# Patient Record
Sex: Female | Born: 1935 | Race: White | Hispanic: No | Smoking: Never smoker
Health system: Southern US, Community
[De-identification: ages and names within clinical notes are randomized; demographics above are authoritative.]

## PROBLEM LIST (undated history)

## (undated) DIAGNOSIS — F419 Anxiety disorder, unspecified: Secondary | ICD-10-CM

## (undated) DIAGNOSIS — Z87442 Personal history of urinary calculi: Secondary | ICD-10-CM

## (undated) HISTORY — PX: FOOT SURGERY: SHX648

## (undated) HISTORY — PX: ABDOMINAL HYSTERECTOMY: SHX81

---

## 1998-06-12 ENCOUNTER — Other Ambulatory Visit: Admission: RE | Admit: 1998-06-12 | Discharge: 1998-06-12 | Payer: Self-pay | Admitting: Obstetrics & Gynecology

## 1999-04-19 ENCOUNTER — Ambulatory Visit (HOSPITAL_COMMUNITY): Admission: RE | Admit: 1999-04-19 | Discharge: 1999-04-19 | Payer: Self-pay | Admitting: Gastroenterology

## 2000-11-25 ENCOUNTER — Encounter: Admission: RE | Admit: 2000-11-25 | Discharge: 2000-11-25 | Payer: Self-pay | Admitting: Obstetrics & Gynecology

## 2000-11-25 ENCOUNTER — Encounter: Payer: Self-pay | Admitting: Obstetrics & Gynecology

## 2002-10-25 ENCOUNTER — Ambulatory Visit (HOSPITAL_COMMUNITY): Admission: RE | Admit: 2002-10-25 | Discharge: 2002-10-25 | Payer: Self-pay | Admitting: Gastroenterology

## 2004-05-30 ENCOUNTER — Other Ambulatory Visit: Admission: RE | Admit: 2004-05-30 | Discharge: 2004-05-30 | Payer: Self-pay | Admitting: Obstetrics & Gynecology

## 2011-07-09 HISTORY — PX: EYE SURGERY: SHX253

## 2012-06-12 ENCOUNTER — Other Ambulatory Visit: Payer: Self-pay | Admitting: Gastroenterology

## 2013-01-24 DIAGNOSIS — M545 Low back pain, unspecified: Secondary | ICD-10-CM | POA: Insufficient documentation

## 2013-01-24 DIAGNOSIS — K635 Polyp of colon: Secondary | ICD-10-CM | POA: Insufficient documentation

## 2013-01-25 DIAGNOSIS — K219 Gastro-esophageal reflux disease without esophagitis: Secondary | ICD-10-CM | POA: Insufficient documentation

## 2013-04-26 ENCOUNTER — Encounter (HOSPITAL_COMMUNITY): Payer: Self-pay | Admitting: Pharmacy Technician

## 2013-04-28 ENCOUNTER — Encounter (HOSPITAL_COMMUNITY): Payer: Self-pay | Admitting: *Deleted

## 2013-05-14 ENCOUNTER — Encounter (HOSPITAL_COMMUNITY): Payer: Self-pay | Admitting: *Deleted

## 2013-05-14 ENCOUNTER — Ambulatory Visit (HOSPITAL_COMMUNITY): Payer: Medicare Other | Admitting: Anesthesiology

## 2013-05-14 ENCOUNTER — Encounter (HOSPITAL_COMMUNITY)
Admission: RE | Disposition: A | Discharge status: Home / Self Care | Payer: Self-pay | Source: Ambulatory Visit | Attending: Gastroenterology

## 2013-05-14 ENCOUNTER — Ambulatory Visit (HOSPITAL_COMMUNITY)
Admission: RE | Admit: 2013-05-14 | Discharge: 2013-05-14 | Disposition: A | Discharge status: Home / Self Care | Payer: Medicare Other | Source: Ambulatory Visit | Attending: Gastroenterology | Admitting: Gastroenterology

## 2013-05-14 ENCOUNTER — Encounter (HOSPITAL_COMMUNITY): Payer: Medicare Other | Admitting: Anesthesiology

## 2013-05-14 DIAGNOSIS — Z800 Family history of malignant neoplasm of digestive organs: Secondary | ICD-10-CM | POA: Insufficient documentation

## 2013-05-14 DIAGNOSIS — D126 Benign neoplasm of colon, unspecified: Secondary | ICD-10-CM | POA: Insufficient documentation

## 2013-05-14 DIAGNOSIS — K648 Other hemorrhoids: Secondary | ICD-10-CM | POA: Insufficient documentation

## 2013-05-14 HISTORY — PX: COLONOSCOPY: SHX5424

## 2013-05-14 HISTORY — DX: Anxiety disorder, unspecified: F41.9

## 2013-05-14 HISTORY — PX: HOT HEMOSTASIS: SHX5433

## 2013-05-14 SURGERY — COLONOSCOPY
Anesthesia: Monitor Anesthesia Care

## 2013-05-14 MED ORDER — PROPOFOL 10 MG/ML IV BOLUS
INTRAVENOUS | Status: DC | PRN
  Administered 2013-05-14 (×4): 20 mg via INTRAVENOUS

## 2013-05-14 MED ORDER — PROPOFOL INFUSION 10 MG/ML OPTIME
INTRAVENOUS | Status: DC | PRN
  Administered 2013-05-14: 100 ug/kg/min via INTRAVENOUS

## 2013-05-14 MED ORDER — PHENYLEPHRINE HCL 10 MG/ML IJ SOLN
INTRAMUSCULAR | Status: DC | PRN
  Administered 2013-05-14: 40 ug via INTRAVENOUS

## 2013-05-14 MED ORDER — SODIUM CHLORIDE 0.9 % IV SOLN: INTRAVENOUS | Status: DC

## 2013-05-14 MED ORDER — KETAMINE HCL 50 MG/ML IJ SOLN
INTRAMUSCULAR | Status: DC | PRN
  Administered 2013-05-14: 25 mg via INTRAMUSCULAR

## 2013-05-14 MED ORDER — LACTATED RINGERS IV SOLN
INTRAVENOUS | Status: DC
  Administered 2013-05-14: 1000 mL via INTRAVENOUS

## 2013-05-14 NOTE — Op Note (Signed)
[facility] [address] [place], [address]   COLONOSCOPY PROCEDURE REPORT  PATIENT: Sydney Key, Sydney Key  MR#: [id number] BIRTHDATE: [birth year] , 79  yrs. old GENDER: Female ENDOSCOPIST: Bernette Redbird, MD REFERRED BY:   Dr,. Lindwood Qua PROCEDURE DATE:  05/14/2013 PROCEDURE:     Colonoscopy with polypectomy and biopsy ASA CLASS: INDICATIONS: the patient is one-year status post local recurrence, with questionable adequacy of excision, of a sessile serrated adenoma at the site of a villous adenoma removed 6 years previously  MEDICATIONS:    MAC per anesthesia  DESCRIPTION OF PROCEDURE: the patient came as an outpatient to the [facility] long endoscopy unit. She provided written consent, time out was performed, and she was sedated by anesthesia without clinical instability throughout the procedure.  Perianal exam showed an excoriated, large, prolapsed internal hemorrhoid which was easily digitally reduced.  The Pentax pediatric video colonoscope was advanced fairly easily around the colon to the terminal ileum, requiring some external abdominal compression to get around the region of the hepatic flexure and into the cecum.  The terminal ileum looked normal.  Pullback was then performed. The quality of the prep was excellent it is felt that all areas were well seen, although the patient did have a large amount of residual liquid in the colon that had to be suctioned out.  In the proximal descending colon was a flat 3 mm sessile polyp which was cold biopsied.  The site of the patient's previous polypectomies in the sigmoid region was identified by 2 tattoos. Nearby was a very flat area, roughly 1.5 cm across, of slightly irregular mucosa, possibly representing a flat polyp, so cold biopsies were obtained from that area.  In between the 2 tattoos was some slightly verrucous tissue corresponding to recurrent sessile polyp formation at the previous polypectomy  site. This was snared off in 2 pieces with complete hemostasis and no evidence of excessive cautery. I did not think there was any spreading flat tissue around this area, so I did not use the argon plasma coagulator as I had originally anticipated might be needed.  A short distance distal to this polypectomy site was a sessile 2 x 4 mm polyp removed by several cold biopsies.  At 25 cm, some distance distal to the polypectomy site, there was a roughly 4-5 mm semi-pedunculated polyp removed by hot snare, again with complete hemostasis and no evidence of excessive cautery.  No masses, large polyps, colitis, vascular ectasia, or diverticular disease were noted on this exam.  Retroflexion in the rectum showed the markedly enlarged, inflamed, somewhat hemorrhagic internal hemorrhoid area and reinspection of the rectum was otherwise unremarkable.  The patient tolerated the procedure well.     COMPLICATIONS: None  ENDOSCOPIC IMPRESSION:  1. probable local recurrence of previous polyp in the sigmoid region, treated by snare polypectomy 2. several other small sessile polyps and questionable flat polyps 3. Significant internal hemorrhoid with prolapse  RECOMMENDATIONS:  1. Await pathology. Anticipate colonoscopic followup in 2 years in view of the activity with which this patient forms polyps, assuming her general health remained stable in view of her advanced age 30. Consideration for hemorrhoidal treatment by a surgeon, perhaps using injection therapy or PPH procedure, at the patient's discretion    _______________________________ eSigned:  Bernette Redbird, MD 05/14/2013 11:26 AM     PATIENT NAME:  Sydney Key, Sydney Key MR#: [id number]

## 2013-05-14 NOTE — Anesthesia Postprocedure Evaluation (Signed)
  Anesthesia Post-op Note  Patient: Sydney Key  Procedure(s) Performed: Procedure(s) (LRB): COLONOSCOPY (N/A) HOT HEMOSTASIS (ARGON PLASMA COAGULATION/BICAP) (N/A)  Patient Location: PACU  Anesthesia Type: MAC  Level of Consciousness: awake and alert   Airway and Oxygen Therapy: Patient Spontanous Breathing  Post-op Pain: mild  Post-op Assessment: Post-op Vital signs reviewed, Patient's Cardiovascular Status Stable, Respiratory Function Stable, Patent Airway and No signs of Nausea or vomiting  Last Vitals:  Filed Vitals:   05/14/13 1120  BP: 110/64  Pulse:   Temp:   Resp: 11    Post-op Vital Signs: stable   Complications: No apparent anesthesia complications

## 2013-05-14 NOTE — Transfer of Care (Signed)
Immediate Anesthesia Transfer of Care Note  Patient: Sydney Key  Procedure(s) Performed: Procedure(s) (LRB): COLONOSCOPY (N/A) HOT HEMOSTASIS (ARGON PLASMA COAGULATION/BICAP) (N/A)  Patient Location: PACU  Anesthesia Type: MAC  Level of Consciousness: sedated, patient cooperative and responds to stimulation  Airway & Oxygen Therapy: Patient Spontanous Breathing and Patient connected to face mask oxgen  Post-op Assessment: Report given to PACU RN and Post -op Vital signs reviewed and stable  Post vital signs: Reviewed and stable  Complications: No apparent anesthesia complications

## 2013-05-14 NOTE — Anesthesia Preprocedure Evaluation (Signed)
Anesthesia Evaluation  Patient identified by MRN, date of birth, ID band Patient awake    Reviewed: Allergy & Precautions, H&P , NPO status , Patient's Chart, lab work & pertinent test results  Airway Mallampati: II  TM Distance: >3 FB Neck ROM: Full    Dental no notable dental hx.    Pulmonary neg pulmonary ROS,  breath sounds clear to auscultation  Pulmonary exam normal       Cardiovascular negative cardio ROS  Rhythm:Regular Rate:Normal     Neuro/Psych negative neurological ROS  negative psych ROS   GI/Hepatic negative GI ROS, Neg liver ROS,   Endo/Other  negative endocrine ROS  Renal/GU negative Renal ROS  negative genitourinary   Musculoskeletal negative musculoskeletal ROS (+)   Abdominal   Peds negative pediatric ROS (+)  Hematology negative hematology ROS (+)   Anesthesia Other Findings   Reproductive/Obstetrics negative OB ROS                             Anesthesia Physical Anesthesia Plan  ASA: I  Anesthesia Plan: MAC   Post-op Pain Management:    Induction: Intravenous  Airway Management Planned: Nasal Cannula  Additional Equipment:   Intra-op Plan:   Post-operative Plan:   Informed Consent: I have reviewed the patients History and Physical, chart, labs and discussed the procedure including the risks, benefits and alternatives for the proposed anesthesia with the patient or authorized representative who has indicated his/her understanding and acceptance.   Dental advisory given  Plan Discussed with: CRNA and Surgeon  Anesthesia Plan Comments:         Anesthesia Quick Evaluation  

## 2013-05-14 NOTE — H&P (Signed)
  Pleasant 79 year old female returns for updated colonoscopy. A year ago, she had a flat polyp, constituting a local recurrence, in the sigmoid region. It was largely removed but it was felt that there would be residual tissue for which today's exam was scheduled. The patient is free of lower tract symptoms apart from frequent hemorrhoidal bleeding.  Past medical:  Allergies: None  Outpatient medications: Aspirin (last dose 5 days ago), vitamin B12, vitamin C, meloxicam, as the latter pram, gabapentin, vitamin supplements  Operations remote hysterectomy  Chronic medical illnesses family history of colon cancer, history of previous colon polyp, arthritis  Physical exam healthy-appearing female, no evident distress. Vitals normal. Chest clear, heart without murmurs or arrhythmias. Abdomen nondistended, soft and nontender.  Impression: Recurrent adenomatous polyp in a patient with a family history of colon cancer  Plan: Proceed to colonoscopic evaluation.  Florencia Reasons, M.D. [phone]

## 2013-05-17 ENCOUNTER — Encounter (HOSPITAL_COMMUNITY): Payer: Self-pay | Admitting: Gastroenterology

## 2014-06-08 DIAGNOSIS — Z780 Asymptomatic menopausal state: Secondary | ICD-10-CM | POA: Insufficient documentation

## 2015-10-02 DIAGNOSIS — H9193 Unspecified hearing loss, bilateral: Secondary | ICD-10-CM | POA: Insufficient documentation

## 2019-01-25 DIAGNOSIS — L989 Disorder of the skin and subcutaneous tissue, unspecified: Secondary | ICD-10-CM | POA: Insufficient documentation

## 2019-01-25 DIAGNOSIS — G2581 Restless legs syndrome: Secondary | ICD-10-CM | POA: Insufficient documentation

## 2020-01-31 DIAGNOSIS — H409 Unspecified glaucoma: Secondary | ICD-10-CM | POA: Insufficient documentation

## 2020-01-31 DIAGNOSIS — G8929 Other chronic pain: Secondary | ICD-10-CM | POA: Insufficient documentation

## 2020-04-08 DIAGNOSIS — K56609 Unspecified intestinal obstruction, unspecified as to partial versus complete obstruction: Secondary | ICD-10-CM | POA: Insufficient documentation

## 2020-06-06 DIAGNOSIS — S72012A Unspecified intracapsular fracture of left femur, initial encounter for closed fracture: Secondary | ICD-10-CM | POA: Insufficient documentation

## 2020-08-18 DIAGNOSIS — Z789 Other specified health status: Secondary | ICD-10-CM | POA: Insufficient documentation

## 2021-07-08 DIAGNOSIS — C801 Malignant (primary) neoplasm, unspecified: Secondary | ICD-10-CM

## 2021-07-08 HISTORY — DX: Malignant (primary) neoplasm, unspecified: C80.1

## 2021-08-14 DIAGNOSIS — M2041 Other hammer toe(s) (acquired), right foot: Secondary | ICD-10-CM | POA: Diagnosis not present

## 2021-08-14 DIAGNOSIS — M2011 Hallux valgus (acquired), right foot: Secondary | ICD-10-CM | POA: Diagnosis not present

## 2021-08-28 DIAGNOSIS — M2011 Hallux valgus (acquired), right foot: Secondary | ICD-10-CM | POA: Diagnosis not present

## 2021-08-28 DIAGNOSIS — M21611 Bunion of right foot: Secondary | ICD-10-CM | POA: Diagnosis not present

## 2021-08-28 DIAGNOSIS — M2041 Other hammer toe(s) (acquired), right foot: Secondary | ICD-10-CM | POA: Diagnosis not present

## 2021-08-31 DIAGNOSIS — M2041 Other hammer toe(s) (acquired), right foot: Secondary | ICD-10-CM | POA: Diagnosis not present

## 2021-08-31 DIAGNOSIS — M2011 Hallux valgus (acquired), right foot: Secondary | ICD-10-CM | POA: Diagnosis not present

## 2021-09-03 DIAGNOSIS — E538 Deficiency of other specified B group vitamins: Secondary | ICD-10-CM | POA: Diagnosis not present

## 2021-09-06 DIAGNOSIS — S72002A Fracture of unspecified part of neck of left femur, initial encounter for closed fracture: Secondary | ICD-10-CM | POA: Diagnosis not present

## 2021-09-06 DIAGNOSIS — S72012A Unspecified intracapsular fracture of left femur, initial encounter for closed fracture: Secondary | ICD-10-CM | POA: Diagnosis not present

## 2021-09-06 DIAGNOSIS — Z96642 Presence of left artificial hip joint: Secondary | ICD-10-CM | POA: Diagnosis not present

## 2021-10-01 DIAGNOSIS — E538 Deficiency of other specified B group vitamins: Secondary | ICD-10-CM | POA: Diagnosis not present

## 2021-10-02 DIAGNOSIS — R202 Paresthesia of skin: Secondary | ICD-10-CM | POA: Diagnosis not present

## 2021-10-02 DIAGNOSIS — L579 Skin changes due to chronic exposure to nonionizing radiation, unspecified: Secondary | ICD-10-CM | POA: Diagnosis not present

## 2021-10-02 DIAGNOSIS — L821 Other seborrheic keratosis: Secondary | ICD-10-CM | POA: Diagnosis not present

## 2021-10-02 DIAGNOSIS — L570 Actinic keratosis: Secondary | ICD-10-CM | POA: Diagnosis not present

## 2021-10-10 DIAGNOSIS — M2011 Hallux valgus (acquired), right foot: Secondary | ICD-10-CM | POA: Diagnosis not present

## 2021-10-17 DIAGNOSIS — H401123 Primary open-angle glaucoma, left eye, severe stage: Secondary | ICD-10-CM | POA: Diagnosis not present

## 2021-10-17 DIAGNOSIS — H5213 Myopia, bilateral: Secondary | ICD-10-CM | POA: Diagnosis not present

## 2021-10-24 DIAGNOSIS — H524 Presbyopia: Secondary | ICD-10-CM | POA: Diagnosis not present

## 2021-10-29 DIAGNOSIS — E538 Deficiency of other specified B group vitamins: Secondary | ICD-10-CM | POA: Diagnosis not present

## 2021-11-19 DIAGNOSIS — N644 Mastodynia: Secondary | ICD-10-CM | POA: Diagnosis not present

## 2021-11-19 DIAGNOSIS — N6315 Unspecified lump in the right breast, overlapping quadrants: Secondary | ICD-10-CM | POA: Diagnosis not present

## 2021-11-19 DIAGNOSIS — R921 Mammographic calcification found on diagnostic imaging of breast: Secondary | ICD-10-CM | POA: Diagnosis not present

## 2021-11-26 DIAGNOSIS — E538 Deficiency of other specified B group vitamins: Secondary | ICD-10-CM | POA: Diagnosis not present

## 2021-11-28 ENCOUNTER — Other Ambulatory Visit: Payer: Self-pay

## 2021-11-28 DIAGNOSIS — C50811 Malignant neoplasm of overlapping sites of right female breast: Secondary | ICD-10-CM | POA: Diagnosis not present

## 2021-11-29 DIAGNOSIS — C50811 Malignant neoplasm of overlapping sites of right female breast: Secondary | ICD-10-CM | POA: Diagnosis not present

## 2021-11-29 DIAGNOSIS — N6315 Unspecified lump in the right breast, overlapping quadrants: Secondary | ICD-10-CM | POA: Diagnosis not present

## 2021-12-04 ENCOUNTER — Encounter: Payer: Self-pay | Admitting: *Deleted

## 2021-12-04 ENCOUNTER — Telehealth: Payer: Self-pay | Admitting: *Deleted

## 2021-12-04 ENCOUNTER — Encounter: Payer: Self-pay | Admitting: Internal Medicine

## 2021-12-04 DIAGNOSIS — C50311 Malignant neoplasm of lower-inner quadrant of right female breast: Secondary | ICD-10-CM | POA: Insufficient documentation

## 2021-12-04 NOTE — Telephone Encounter (Signed)
Confirmed [facility] for 12/05/21 at 1215pm.  Instructions and contact information given.

## 2021-12-04 NOTE — Progress Notes (Signed)
Radiation Oncology         [phone] ________________________________  Initial Outpatient Consultation  Name: Sydney Key MRN: [id number]  Date: 12/05/2021  DOB: Mar 21, 1931  CC:Raelene Bott, MD  Raelene Bott, MD   REFERRING PHYSICIAN: Raelene Bott, MD  DIAGNOSIS: No diagnosis found.  Stage *** Right Breast LIQ, Invasive Ductal Carcinoma ***, ER*** / PR*** / Her2***, Grade 3  CHIEF COMPLAINT: Here to discuss management of right breast cancer  HISTORY OF PRESENT ILLNESS::Sydney Key is a 88 y.o. female who presented with a non-tender palpable in the upper inner right breast x 1 week.   Subsequent diagnostic bilateral mammogram on 11/19/21 revealed an indeterminate 0.3 cm oval mass in the right breast 3 o'clock position at a posterior depth, and a highly suspicious irregular mass in the 3 o'clock right breast measuring 2.6 cm (anterior depth). No evidence of right axillary lymphadenopathy was appreciated mammographically.   Biopsy of the suspicious 3 o'clock right breast mass on date of 11/28/21 showed grade 3 invasive ductal carcinoma with necrosis measuring 1.4 cm in the greatest tumor dimension.  ER status: ***; PR status ***, Her2 status ***; Grade 3.  ***  PREVIOUS RADIATION THERAPY: {EXAM; YES/NO:19492::"No"}  PAST MEDICAL HISTORY:  has a past medical history of Anxiety.    PAST SURGICAL HISTORY: Past Surgical History:  Procedure Laterality Date   ABDOMINAL HYSTERECTOMY  age 47   COLONOSCOPY N/A 05/14/2013   Procedure: COLONOSCOPY;  Surgeon: Cleotis Nipper, MD;  Location: [facility] ENDOSCOPY;  Service: Endoscopy;  Laterality: N/A;   EYE SURGERY Left 2013   retina detachment   HOT HEMOSTASIS N/A 05/14/2013   Procedure: HOT HEMOSTASIS (ARGON PLASMA COAGULATION/BICAP);  Surgeon: Cleotis Nipper, MD;  Location: [facility];  Service: Endoscopy;  Laterality: N/A;    FAMILY HISTORY: family history is not on file.  SOCIAL HISTORY:  reports that she has never smoked. She  has never used smokeless tobacco. She reports that she does not drink alcohol and does not use drugs.  ALLERGIES: Patient has no known allergies.  MEDICATIONS:  Current Outpatient Medications  Medication Sig Dispense Refill   Ascorbic Acid (VITAMIN C WITH ROSE HIPS) 500 MG tablet Take 500 mg by mouth daily.     aspirin EC 81 MG tablet Take 81 mg by mouth every evening.     cholecalciferol (VITAMIN D) 1000 UNITS tablet Take 1,000 Units by mouth daily.     escitalopram (LEXAPRO) 10 MG tablet Take 10 mg by mouth as needed.     fish oil-omega-3 fatty acids 1000 MG capsule Take 2 g by mouth daily.     gabapentin (NEURONTIN) 300 MG capsule Take 300 mg by mouth at bedtime.     meloxicam (MOBIC) 15 MG tablet Take 7.5 mg by mouth daily.     Multiple Vitamin (MULTIVITAMIN WITH MINERALS) TABS tablet Take 1 tablet by mouth daily.     omeprazole (PRILOSEC) 20 MG capsule Take 20 mg by mouth daily as needed (heart burn).     No current facility-administered medications for this encounter.    REVIEW OF SYSTEMS: As above in HPI.   PHYSICAL EXAM:  vitals were not taken for this visit.   General: Alert and oriented, in no acute distress HEENT: Head is normocephalic. Extraocular movements are intact. Oropharynx is clear. Neck: Neck is supple, no palpable cervical or supraclavicular lymphadenopathy. Heart: Regular in rate and rhythm with no murmurs, rubs, or gallops. Chest: Clear to auscultation bilaterally, with no rhonchi, wheezes, or  rales. Abdomen: Soft, nontender, nondistended, with no rigidity or guarding. Extremities: No cyanosis or edema. Lymphatics: see Neck Exam Skin: No concerning lesions. Musculoskeletal: symmetric strength and muscle tone throughout. Neurologic: Cranial nerves II through XII are grossly intact. No obvious focalities. Speech is fluent. Coordination is intact. Psychiatric: Judgment and insight are intact. Affect is appropriate. Breasts: *** . No other palpable masses  appreciated in the breasts or axillae *** .    ECOG = ***  0 - Asymptomatic (Fully active, able to carry on all predisease activities without restriction)  1 - Symptomatic but completely ambulatory (Restricted in physically strenuous activity but ambulatory and able to carry out work of a light or sedentary nature. For example, light housework, office work)  2 - Symptomatic, <50% in bed during the day (Ambulatory and capable of all self care but unable to carry out any work activities. Up and about more than 50% of waking hours)  3 - Symptomatic, >50% in bed, but not bedbound (Capable of only limited self-care, confined to bed or chair 50% or more of waking hours)  4 - Bedbound (Completely disabled. Cannot carry on any self-care. Totally confined to bed or chair)  5 - Death   Eustace Pen MM, Creech RH, Tormey DC, et al. [phone]). "Toxicity and response criteria of the [facility]". [place] Oncol. 5 (6): 649-55   LABORATORY DATA:  No results found for: WBC, HGB, HCT, MCV, PLT CMP  No results found for: NA, K, CL, CO2, GLUCOSE, BUN, CREATININE, CALCIUM, PROT, ALBUMIN, AST, ALT, ALKPHOS, BILITOT, GFRNONAA, GFRAA       RADIOGRAPHY: No results found.    IMPRESSION/PLAN: ***   It was a pleasure meeting the patient today. We discussed the risks, benefits, and side effects of radiotherapy. I recommend radiotherapy to the *** to reduce her risk of locoregional recurrence by 2/3.  We discussed that radiation would take approximately *** weeks to complete and that I would give the patient a few weeks to heal following surgery before starting treatment planning. *** If chemotherapy were to be given, this would precede radiotherapy. We spoke about acute effects including skin irritation and fatigue as well as much less common late effects including internal organ injury or irritation. We spoke about the latest technology that is used to minimize the risk of late effects for  patients undergoing radiotherapy to the breast or chest wall. No guarantees of treatment were given. The patient is enthusiastic about proceeding with treatment. I look forward to participating in the patient's care.  I will await her referral back to me for postoperative follow-up and eventual CT simulation/treatment planning.  On date of service, in total, I spent *** minutes on this encounter. Patient was seen in person.   __________________________________________   Eppie Gibson, MD  This document serves as a record of services personally performed by Eppie Gibson, MD. It was created on her behalf by Roney Mans, a trained medical scribe. The creation of this record is based on the scribe's personal observations and the provider's statements to them. This document has been checked and approved by the attending provider.

## 2021-12-04 NOTE — Progress Notes (Signed)
[place] NOTE  Patient Care Team: Raelene Bott, MD as PCP - General (Internal Medicine) Mauro Kaufmann, RN as Oncology Nurse Navigator Rockwell Germany, RN as Oncology Nurse Navigator Erroll Luna, MD as Consulting Physician (General Surgery) Nicholas Lose, MD as Consulting Physician (Hematology and Oncology) Eppie Gibson, MD as Attending Physician (Radiation Oncology)  CHIEF COMPLAINTS/PURPOSE OF CONSULTATION:  Newly diagnosed breast cancer Right breast Dcis  Screening mammogram on *** detected ***  Diagnostic mammogram on *** showed   Biopsy on the date of *** showed: ***. Prognostic indicators significant for ER ***; PR ***, Her2 status ***; Grade 3.    HISTORY OF PRESENTING ILLNESS:  Sydney Key 88 y.o. female is here because of recent diagnosis of {left/right:311354}   I reviewed her records extensively and collaborated the history with the patient.  SUMMARY OF ONCOLOGIC HISTORY: Oncology History   No history exists.     MEDICAL HISTORY:  Past Medical History:  Diagnosis Date   Anxiety     SURGICAL HISTORY: Past Surgical History:  Procedure Laterality Date   ABDOMINAL HYSTERECTOMY  age 3   COLONOSCOPY N/A 05/14/2013   Procedure: COLONOSCOPY;  Surgeon: Cleotis Nipper, MD;  Location: [facility];  Service: Endoscopy;  Laterality: N/A;   EYE SURGERY Left 2013   retina detachment   HOT HEMOSTASIS N/A 05/14/2013   Procedure: HOT HEMOSTASIS (ARGON PLASMA COAGULATION/BICAP);  Surgeon: Cleotis Nipper, MD;  Location: [facility];  Service: Endoscopy;  Laterality: N/A;    SOCIAL HISTORY: Social History   Socioeconomic History   Marital status: Married    Spouse name: Not on file   Number of children: Not on file   Years of education: Not on file   Highest education level: Not on file  Occupational History   Not on file  Tobacco Use   Smoking status: Never   Smokeless tobacco: Never  Substance and Sexual Activity    Alcohol use: No   Drug use: No   Sexual activity: Not on file  Other Topics Concern   Not on file  Social History Narrative   Not on file   Social Determinants of Health   Financial Resource Strain: Not on file  Food Insecurity: Not on file  Transportation Needs: Not on file  Physical Activity: Not on file  Stress: Not on file  Social Connections: Not on file  Intimate Partner Violence: Not on file    FAMILY HISTORY: No family history on file.  ALLERGIES:  has No Known Allergies.  MEDICATIONS:  Current Outpatient Medications  Medication Sig Dispense Refill   Ascorbic Acid (VITAMIN C WITH ROSE HIPS) 500 MG tablet Take 500 mg by mouth daily.     aspirin EC 81 MG tablet Take 81 mg by mouth every evening.     cholecalciferol (VITAMIN D) 1000 UNITS tablet Take 1,000 Units by mouth daily.     escitalopram (LEXAPRO) 10 MG tablet Take 10 mg by mouth as needed.     fish oil-omega-3 fatty acids 1000 MG capsule Take 2 g by mouth daily.     gabapentin (NEURONTIN) 300 MG capsule Take 300 mg by mouth at bedtime.     meloxicam (MOBIC) 15 MG tablet Take 7.5 mg by mouth daily.     Multiple Vitamin (MULTIVITAMIN WITH MINERALS) TABS tablet Take 1 tablet by mouth daily.     omeprazole (PRILOSEC) 20 MG capsule Take 20 mg by mouth daily as needed (heart burn).  No current facility-administered medications for this visit.    REVIEW OF SYSTEMS:   Constitutional: Denies fevers, chills or abnormal night sweats Eyes: Denies blurriness of vision, double vision or watery eyes Ears, nose, mouth, throat, and face: Denies mucositis or sore throat Respiratory: Denies cough, dyspnea or wheezes Cardiovascular: Denies palpitation, chest discomfort or lower extremity swelling Gastrointestinal:  Denies nausea, heartburn or change in bowel habits Skin: Denies abnormal skin rashes Lymphatics: Denies new lymphadenopathy or easy bruising Neurological:Denies numbness, tingling or new  weaknesses Behavioral/Psych: Mood is stable, no new changes  Breast: *** Denies any palpable lumps or discharge All other systems were reviewed with the patient and are negative.  PHYSICAL EXAMINATION: ECOG PERFORMANCE STATUS: {CHL ONC ECOG [phone]}  There were no vitals filed for this visit. There were no vitals filed for this visit.  GENERAL:alert, no distress and comfortable SKIN: skin color, texture, turgor are normal, no rashes or significant lesions EYES: normal, conjunctiva are pink and non-injected, sclera clear OROPHARYNX:no exudate, no erythema and lips, buccal mucosa, and tongue normal  NECK: supple, thyroid normal size, non-tender, without nodularity LYMPH:  no palpable lymphadenopathy in the cervical, axillary or inguinal LUNGS: clear to auscultation and percussion with normal breathing effort HEART: regular rate & rhythm and no murmurs and no lower extremity edema ABDOMEN:abdomen soft, non-tender and normal bowel sounds Musculoskeletal:no cyanosis of digits and no clubbing  PSYCH: alert & oriented x 3 with fluent speech NEURO: no focal motor/sensory deficits BREAST:*** No palpable nodules in breast. No palpable axillary or supraclavicular lymphadenopathy (exam performed in the presence of a chaperone)   LABORATORY DATA:  I have reviewed the data as listed No results found for: WBC, HGB, HCT, MCV, PLT No results found for: NA, K, CL, CO2  RADIOGRAPHIC STUDIES: I have personally reviewed the radiological reports and agreed with the findings in the report.  ASSESSMENT AND PLAN:  No problem-specific Assessment & Plan notes found for this encounter.   All questions were answered. The patient knows to call the clinic with any problems, questions or concerns.    [place], CMA 12/04/21  I Gardiner Coins am scribing for Dr. Lindi Adie  ***

## 2021-12-05 ENCOUNTER — Inpatient Hospital Stay: Payer: Medicare Other

## 2021-12-05 ENCOUNTER — Encounter: Payer: Self-pay | Admitting: Emergency Medicine

## 2021-12-05 ENCOUNTER — Encounter: Payer: Self-pay | Admitting: *Deleted

## 2021-12-05 ENCOUNTER — Encounter: Payer: Self-pay | Admitting: General Practice

## 2021-12-05 ENCOUNTER — Other Ambulatory Visit: Payer: Self-pay

## 2021-12-05 ENCOUNTER — Inpatient Hospital Stay: Payer: Medicare Other | Attending: Hematology and Oncology | Admitting: Hematology and Oncology

## 2021-12-05 ENCOUNTER — Ambulatory Visit
Admission: RE | Admit: 2021-12-05 | Discharge: 2021-12-05 | Disposition: A | Discharge status: Home / Self Care | Payer: Medicare Other | Source: Ambulatory Visit | Attending: Radiation Oncology | Admitting: Radiation Oncology

## 2021-12-05 ENCOUNTER — Other Ambulatory Visit: Payer: Self-pay | Admitting: *Deleted

## 2021-12-05 ENCOUNTER — Ambulatory Visit: Payer: Medicare Other | Attending: Surgery | Admitting: Physical Therapy

## 2021-12-05 ENCOUNTER — Ambulatory Visit: Payer: Self-pay | Admitting: Surgery

## 2021-12-05 ENCOUNTER — Inpatient Hospital Stay (HOSPITAL_BASED_OUTPATIENT_CLINIC_OR_DEPARTMENT_OTHER): Payer: Medicare Other | Admitting: Genetic Counselor

## 2021-12-05 ENCOUNTER — Encounter: Payer: Self-pay | Admitting: Physical Therapy

## 2021-12-05 VITALS — BP 123/63 | HR 64 | Temp 98.8°F | Resp 18 | Ht 65.5 in | Wt 138.2 lb

## 2021-12-05 DIAGNOSIS — C50311 Malignant neoplasm of lower-inner quadrant of right female breast: Secondary | ICD-10-CM

## 2021-12-05 DIAGNOSIS — Z171 Estrogen receptor negative status [ER-]: Secondary | ICD-10-CM | POA: Insufficient documentation

## 2021-12-05 DIAGNOSIS — R293 Abnormal posture: Secondary | ICD-10-CM

## 2021-12-05 DIAGNOSIS — Z803 Family history of malignant neoplasm of breast: Secondary | ICD-10-CM | POA: Diagnosis not present

## 2021-12-05 DIAGNOSIS — C50211 Malignant neoplasm of upper-inner quadrant of right female breast: Secondary | ICD-10-CM | POA: Diagnosis not present

## 2021-12-05 LAB — CMP (CANCER CENTER ONLY)
ALT: 12 U/L (ref 0–44)
AST: 15 U/L (ref 15–41)
Albumin: 4.1 g/dL (ref 3.5–5.0)
Alkaline Phosphatase: 55 U/L (ref 38–126)
Anion gap: 6 (ref 5–15)
BUN: 19 mg/dL (ref 8–23)
CO2: 30 mmol/L (ref 22–32)
Calcium: 9.6 mg/dL (ref 8.9–10.3)
Chloride: 105 mmol/L (ref 98–111)
Creatinine: 0.7 mg/dL (ref 0.44–1.00)
GFR, Estimated: >60 mL/min (ref >60)
Glucose, Bld: 146 mg/dL — ABNORMAL HIGH (ref 70–99)
Potassium: 3.8 mmol/L (ref 3.5–5.1)
Sodium: 141 mmol/L (ref 135–145)
Total Bilirubin: 0.9 mg/dL (ref 0.3–1.2)
Total Protein: 6.8 g/dL (ref 6.5–8.1)

## 2021-12-05 LAB — CBC WITH DIFFERENTIAL (CANCER CENTER ONLY)
Abs Immature Granulocytes: 0.01 10*3/uL (ref 0.00–0.07)
Basophils Absolute: 0 10*3/uL (ref 0.0–0.1)
Basophils Relative: 1 %
Eosinophils Absolute: 0.1 10*3/uL (ref 0.0–0.5)
Eosinophils Relative: 1 %
HCT: 36.8 % (ref 36.0–46.0)
Hemoglobin: 12.3 g/dL (ref 12.0–15.0)
Immature Granulocytes: 0 %
Lymphocytes Relative: 34 %
Lymphs Abs: 1.3 10*3/uL (ref 0.7–4.0)
MCH: 31.7 pg (ref 26.0–34.0)
MCHC: 33.4 g/dL (ref 30.0–36.0)
MCV: 94.8 fL (ref 80.0–100.0)
Monocytes Absolute: 0.3 10*3/uL (ref 0.1–1.0)
Monocytes Relative: 8 %
Neutro Abs: 2.2 10*3/uL (ref 1.7–7.7)
Neutrophils Relative %: 56 %
Platelet Count: 189 10*3/uL (ref 150–400)
RBC: 3.88 MIL/uL (ref 3.87–5.11)
RDW: 12.5 % (ref 11.5–15.5)
WBC Count: 3.9 10*3/uL — ABNORMAL LOW (ref 4.0–10.5)
nRBC: 0 % (ref 0.0–0.2)

## 2021-12-05 LAB — GENETIC SCREENING ORDER

## 2021-12-05 MED ORDER — LIDOCAINE-PRILOCAINE 2.5-2.5 % EX CREA: TOPICAL_CREAM | CUTANEOUS | 3 refills | Status: DC

## 2021-12-05 MED ORDER — ONDANSETRON HCL 8 MG PO TABS: 8.0000 mg | ORAL_TABLET | Freq: Two times a day (BID) | ORAL | 1 refills | Status: DC | PRN

## 2021-12-05 MED ORDER — PROCHLORPERAZINE MALEATE 10 MG PO TABS: 10.0000 mg | ORAL_TABLET | Freq: Four times a day (QID) | ORAL | 1 refills | Status: DC | PRN

## 2021-12-05 NOTE — Research (Signed)
[employer] 2021-05 - Specimen Collection Study to Evaluate Biomarkers in Subjects with Cancer   INTRO STUDY/CONSENTS  Patient Sydney Key was identified by MD Lindi Adie as a potential candidate for the above listed study.  This Clinical Research Nurse met with LAPORSCHA LINEHAN, [id number], on 12/05/21 in a manner and location that ensures patient privacy to discuss participation in the above listed research study.  Patient is Accompanied by both daughters .  A copy of the informed consent document with embedded HIPAA language was provided to the patient.  Patient reads, speaks, and understands [place].   Patient was provided with the business card of this Nurse and encouraged to contact the research team with any questions.  Approximately 20 minutes were spent with the patient reviewing the informed consent documents.  Patient was provided the option of taking informed consent documents home to review and was encouraged to review at their convenience with their support network, including other care providers. Patient took the consent documents home to review.  Will f/u with patient in the next few days to determine interest.  Wells Guiles 'Donell Sievert, RN, BSN Clinical Research Nurse I 12/05/21 4:31 PM

## 2021-12-05 NOTE — Progress Notes (Signed)
[place] Psychosocial Distress Screening Spiritual Care  Met with Sydney Key and her two daughters in [facility] to introduce [place] team/resources, reviewing distress screen per protocol.  The patient scored a  [unspecified]  on the Psychosocial Distress Thermometer which indicates  [unspecified]  distress. Also assessed for distress and other psychosocial needs.    12/05/2021  ONCBCN DISTRESS SCREENING   Screening Type Initial Screening   Practical problem type --   Physical Problem type Tingling hands/feet;Skin dry/itchy   Referral to support programs Yes    Sydney Key notes that she selected "housing" on her distress screen because she hates housework. She reports good support from her daughters and son, who is also local. One of her daughters also received treatment here at [facility], so there is some familiarity with support programming and resources.  Provided pastoral presence, normalization of feelings, affirmation of strengths, and introduction to [facility] and [employer] support programming.  Follow up needed: No. Sydney Key prefers to reach out as needed/desired.   [place], [place], [facility] Pager [phone] Voicemail [phone]

## 2021-12-05 NOTE — Assessment & Plan Note (Signed)
11/28/2021:Palpable right breast lump 2.6 cm with calcifications at 3 o'clock position, additional 1.3 cm lesion is an intramammary lymph node biopsy: Benign, axilla negative, biopsy of the breast lump: Grade 3 IDC ER 0%, PR 0%, Ki-67 98%, HER2 3+ positive  Pathology and radiology counseling: Discussed with the patient, the details of pathology including the type of breast cancer,the clinical staging, the significance of ER, PR and HER-2/neu receptors and the implications for treatment. After reviewing the pathology in detail, we proceeded to discuss the different treatment options between surgery, radiation, chemotherapy, antiestrogen therapies.  Treatment plan: 1.  Breast conserving surgery with sentinel lymph node biopsy 2. adjuvant Herceptin subcutaneous 3.  Adjuvant radiation 4.  Genetics consultation  Discussed the pros and cons of Herceptin and how we need to monitor cardiac function with echocardiograms every 3 months for 1 year  Return to clinic after surgery to discuss starting anti-HER2 therapy 

## 2021-12-05 NOTE — Progress Notes (Signed)
START OFF PATHWAY REGIMEN - Breast   [id number]:Paclitaxel + Trastuzumab:   A cycle is every 28 days:     Paclitaxel      Trastuzumab-xxxx      Trastuzumab-xxxx   **Always confirm dose/schedule in your pharmacy ordering system**  Patient Characteristics: Preoperative or Nonsurgical Candidate (Clinical Staging), Neoadjuvant Therapy followed by Surgery, Invasive Disease, Chemotherapy, HER2 Positive, ER Negative/Unknown Therapeutic Status: Preoperative or Nonsurgical Candidate (Clinical Staging) AJCC M Category: cM0 AJCC Grade: G3 Breast Surgical Plan: Neoadjuvant Therapy followed by Surgery ER Status: Negative (-) AJCC 8 Stage Grouping: IIA HER2 Status: Positive (+) AJCC T Category: cT2 AJCC N Category: cN0 PR Status: Negative (-) Intent of Therapy: Curative Intent, Discussed with Patient

## 2021-12-05 NOTE — Therapy (Signed)
OUTPATIENT PHYSICAL THERAPY BREAST CANCER BASELINE EVALUATION   Patient Name: Sydney Key MRN: [id number] DOB:1930-12-04, 88 y.o., female Today's Date: 12/05/2021   PT End of Session - 12/05/21 1517     Visit Number 1    Number of Visits 2    Date for PT Re-Evaluation 01/30/22    PT Start Time 1426    PT Stop Time 1510    PT Time Calculation (min) 44 min    Activity Tolerance Patient tolerated treatment well    Behavior During Therapy [facility] for tasks assessed/performed             Past Medical History:  Diagnosis Date   Anxiety    Past Surgical History:  Procedure Laterality Date   ABDOMINAL HYSTERECTOMY  age 82   COLONOSCOPY N/A 05/14/2013   Procedure: COLONOSCOPY;  Surgeon: Cleotis Nipper, MD;  Location: [facility];  Service: Endoscopy;  Laterality: N/A;   EYE SURGERY Left 07/09/2011   retina detachment   FOOT SURGERY     HOT HEMOSTASIS N/A 05/14/2013   Procedure: HOT HEMOSTASIS (ARGON PLASMA COAGULATION/BICAP);  Surgeon: Cleotis Nipper, MD;  Location: [facility];  Service: Endoscopy;  Laterality: N/A;   Patient Active Problem List   Diagnosis Date Noted   Malignant neoplasm of lower-inner quadrant of right breast of female, estrogen receptor negative ([place]) 12/04/2021    PCP: Raelene Bott, MD  REFERRING PROVIDER: Dr. Erroll Luna  REFERRING DIAG: Right breast cancer  THERAPY DIAG:  Malignant neoplasm of upper-inner quadrant of right breast in female, estrogen receptor negative ([place])  Abnormal posture  Rationale for Evaluation and Treatment Rehabilitation  ONSET DATE: 11/19/2021  SUBJECTIVE                                                                                                                                                                                           SUBJECTIVE STATEMENT: Patient reports she is here today to be seen by her medical team for her newly diagnosed right breast cancer.   PERTINENT HISTORY:  Patient was  diagnosed on 11/19/2021 with right 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 It measures 2.6 cm and is located in the upper inner quadrant. It is ER/PR negative and HER2 positive with a Ki67 of 98%. She had a left hip fracture in 05/2020 which required surgery and had a right foot bunion surgery in 08/2021.  PATIENT GOALS   reduce lymphedema risk and learn post op HEP.   PAIN:  Are you having pain? No   PRECAUTIONS: Active CA   HAND DOMINANCE: right  WEIGHT BEARING RESTRICTIONS No  FALLS:  Has patient  fallen in last 6 months? No  LIVING ENVIRONMENT: Patient lives with: alone Lives in: House/apartment Has following equipment at home: None  OCCUPATION: Retired  LEISURE: She does not exercise  PRIOR LEVEL OF FUNCTION: Independent   OBJECTIVE  COGNITION:  Overall cognitive status: Within functional limits for tasks assessed    POSTURE:  Forward head and rounded shoulders posture  UPPER EXTREMITY AROM/PROM:  A/PROM RIGHT   eval   Shoulder extension 67  Shoulder flexion 155  Shoulder abduction 156  Shoulder internal rotation 77  Shoulder external rotation 88    (Blank rows = not tested)  A/PROM LEFT   eval  Shoulder extension 62  Shoulder flexion 149  Shoulder abduction 150  Shoulder internal rotation 80  Shoulder external rotation 73    (Blank rows = not tested)   CERVICAL AROM: All within normal limits:    Percent limited  Flexion WNL  Extension WNL  Right lateral flexion WNL  Left lateral flexion 25% limited  Right rotation WNL  Left rotation 25% limited     UPPER EXTREMITY STRENGTH: WFL   LYMPHEDEMA ASSESSMENTS:   LANDMARK RIGHT   eval  10 cm proximal to olecranon process 23.1  Olecranon process 21.9  10 cm proximal to ulnar styloid process 19.5  Just proximal to ulnar styloid process 13.9  Across hand at thumb web space 16.2  At base of 2nd digit 5.8  (Blank rows = not tested)  LANDMARK LEFT   eval  10 cm proximal to  olecranon process 23.2  Olecranon process 21.8  10 cm proximal to ulnar styloid process 17.7  Just proximal to ulnar styloid process 13.1  Across hand at thumb web space 15  At base of 2nd digit 5.2  (Blank rows = not tested)   L-DEX LYMPHEDEMA SCREENING:  The patient was assessed using the L-Dex machine today to produce a lymphedema index baseline score. The patient will be reassessed on a regular basis (typically every 3 months) to obtain new L-Dex scores. If the score is > 6.5 points away from his/her baseline score indicating onset of subclinical lymphedema, it will be recommended to wear a compression garment for 4 weeks, 12 hours per day and then be reassessed. If the score continues to be > 6.5 points from baseline at reassessment, we will initiate lymphedema treatment. Assessing in this manner has a 95% rate of preventing clinically significant lymphedema.   L-DEX FLOWSHEETS - 12/05/21 1500       L-DEX LYMPHEDEMA SCREENING   Measurement Type Unilateral    L-DEX MEASUREMENT EXTREMITY Upper Extremity    POSITION  Standing    DOMINANT SIDE Right    At Risk Side Right    BASELINE SCORE (UNILATERAL) -0.1              QUICK DASH SURVEY:   PATIENT EDUCATION:  Education details: Lymphedema risk reduction and post op shoulder/posture HEP Person educated: Patient Education method: Explanation, Demonstration, Handout Education comprehension: Patient verbalized understanding and returned demonstration   HOME EXERCISE PROGRAM: Patient was instructed today in a home exercise program today for post op shoulder range of motion. These included active assist shoulder flexion in sitting, scapular retraction, wall walking with shoulder abduction, and hands behind head external rotation.  She was encouraged to do these twice a day, holding 3 seconds and repeating 5 times when permitted by her physician.   ASSESSMENT:  CLINICAL IMPRESSION: Patient was diagnosed on 11/19/2021 with  right grade 3 invasive ductal carcinoma breast  cancer. It measures 2.6 cm and is located in the upper inner quadrant. It is ER/PR negative and HER2 positive with a Ki67 of 98%. She had a left hip fracture in 05/2020 which required surgery and had a right foot bunion surgery in 08/2021. Her multidisciplinary medical team met prior to her assessments to determine a recommended treatment plan. She is planning to have a right lumpectomy and sentinel node biopsy followed by chemo with Taxol and Herceptin and radiation. She will benefit from a post op PT reassessment to determine needs and from L-Dex screens every 3 months for 2 years to detect subclinical lymphedema.  Pt will benefit from skilled therapeutic intervention to improve on the following deficits: Decreased knowledge of precautions, impaired UE functional use, pain, decreased ROM, postural dysfunction.   PT treatment/interventions: ADL/self-care home management, pt/family education, therapeutic exercise  REHAB POTENTIAL: Excellent  CLINICAL DECISION MAKING: Stable/uncomplicated  EVALUATION COMPLEXITY: Low   GOALS: Goals reviewed with patient? YES  LONG TERM GOALS: (STG=LTG)    Name Target Date Goal status  1 Pt will be able to verbalize understanding of pertinent lymphedema risk reduction practices relevant to her dx specifically related to skin care.  Baseline:  No knowledge 12/05/2021 Achieved at eval  2 Pt will be able to return demo and/or verbalize understanding of the post op HEP related to regaining shoulder ROM. Baseline:  No knowledge 12/05/2021 Achieved at eval  3 Pt will be able to verbalize understanding of the importance of attending the post op After Breast CA Class for further lymphedema risk reduction education and therapeutic exercise.  Baseline:  No knowledge 12/05/2021 Achieved at eval  4 Pt will demo she has regained full shoulder ROM and function post operatively compared to baselines.  Baseline: See objective  measurements taken today. 01/30/2022      PLAN: PT FREQUENCY/DURATION: EVAL and 1 follow up appointment.   PLAN FOR NEXT SESSION: will reassess 3-4 weeks post op to determine needs.   Patient will follow up at outpatient cancer rehab 3-4 weeks following surgery.  If the patient requires physical therapy at that time, a specific plan will be dictated and sent to the referring physician for approval. The patient was educated today on appropriate basic range of motion exercises to begin post operatively and the importance of attending the After Breast Cancer class following surgery.  Patient was educated today on lymphedema risk reduction practices as it pertains to recommendations that will benefit the patient immediately following surgery.  She verbalized good understanding.    Physical Therapy Information for After Breast Cancer Surgery/Treatment:  Lymphedema is a swelling condition that you may be at risk for in your arm if you have lymph nodes removed from the armpit area.  After a sentinel node biopsy, the risk is approximately 5-9% and is higher after an axillary node dissection.  There is treatment available for this condition and it is not life-threatening.  Contact your physician or physical therapist with concerns. You may begin the 4 shoulder/posture exercises (see additional sheet) when permitted by your physician (typically a week after surgery).  If you have drains, you may need to wait until those are removed before beginning range of motion exercises.  A general recommendation is to not lift your arms above shoulder height until drains are removed.  These exercises should be done to your tolerance and gently.  This is not a "no pain/no gain" type of recovery so listen to your body and stretch into the range of motion  that you can tolerate, stopping if you have pain.  If you are having immediate reconstruction, ask your plastic surgeon about doing exercises as he or she may want you to  wait. We encourage you to attend the free one time ABC (After Breast Cancer) class offered by [facility].  You will learn information related to lymphedema risk, prevention and treatment and additional exercises to regain mobility following surgery.  You can call [phone] for more information.  This is offered the 1st and 3rd Monday of each month.  You only attend the class one time. While undergoing any medical procedure or treatment, try to avoid blood pressure being taken or needle sticks from occurring on the arm on the side of cancer.   This recommendation begins after surgery and continues for the rest of your life.  This may help reduce your risk of getting lymphedema (swelling in your arm). An excellent resource for those seeking information on lymphedema is the National Lymphedema Network's web site. It can be accessed at [place].org If you notice swelling in your hand, arm or breast at any time following surgery (even if it is many years from now), please contact your doctor or physical therapist to discuss this.  Lymphedema can be treated at any time but it is easier for you if it is treated early on.  If you feel like your shoulder motion is not returning to normal in a reasonable amount of time, please contact your surgeon or physical therapist.  [facility] [phone]. [address], [place] [address]  ABC CLASS After Breast Cancer Class  After Breast Cancer Class is a specially designed exercise class to assist you in a safe recover after having breast cancer surgery.  In this class you will learn how to get back to full function whether your drains were just removed or if you had surgery a month ago.  This one-time class is held the 1st and 3rd Monday of every month from 11:00 a.m. until 12:00 noon virtually.  This class is FREE and space is limited. For more information or to register for the next  available class, call [phone].  Class Goals  Understand specific stretches to improve the flexibility of you chest and shoulder. Learn ways to safely strengthen your upper body and improve your posture. Understand the warning signs of infection and why you may be at risk for an arm infection. Learn about Lymphedema and prevention.  ** You do not attend this class until after surgery.  Drains must be removed to participate  Patient was instructed today in a home exercise program today for post op shoulder range of motion. These included active assist shoulder flexion in sitting, scapular retraction, wall walking with shoulder abduction, and hands behind head external rotation.  She was encouraged to do these twice a day, holding 3 seconds and repeating 5 times when permitted by her physician.   Annia Friendly, [place] 12/05/21 3:24 PM

## 2021-12-06 ENCOUNTER — Encounter: Payer: Self-pay | Admitting: Hematology and Oncology

## 2021-12-06 ENCOUNTER — Encounter: Payer: Self-pay | Admitting: Genetic Counselor

## 2021-12-06 DIAGNOSIS — Z803 Family history of malignant neoplasm of breast: Secondary | ICD-10-CM | POA: Insufficient documentation

## 2021-12-06 NOTE — Progress Notes (Signed)
REFERRING PROVIDER: Nicholas Lose, MD [place], [place] [address]  PRIMARY PROVIDER:  Raelene Bott, MD  PRIMARY REASON FOR VISIT:  Encounter Diagnoses  Name Primary?   Malignant neoplasm of lower-inner quadrant of right breast of female, estrogen receptor negative ([place]) Yes   Family history of breast cancer     HISTORY OF PRESENT ILLNESS:   Sydney Key, a 88 y.o. female, was seen for a [facility] cancer genetics consultation at the request of Dr. Lindi Adie due to a personal and family history of cancer.  Sydney Key presents to clinic today to discuss the possibility of a hereditary predisposition to cancer, to discuss genetic testing, and to further clarify her future cancer risks, as well as potential cancer risks for family members.   In May 2023, at the age of 35, Sydney Key was diagnosed with invasive ductal carcinoma of the right breast.   CANCER HISTORY:  Oncology History  Malignant neoplasm of lower-inner quadrant of right breast of female, estrogen receptor negative ([place])  11/28/2021 Initial Diagnosis   Palpable right breast lump 2.6 cm with calcifications at 3 o'clock position, additional 1.3 cm lesion is an intramammary lymph node biopsy: Benign, axilla negative, biopsy of the breast lump: Grade 3 IDC ER 0%, PR 0%, Ki-67 98%, HER2 3+ positive   12/05/2021 Cancer Staging   Staging form: Breast, AJCC 8th Edition - Clinical: Stage IIA (cT2, cN0, cM0, G3, ER-, PR-, HER2+) - Signed by Nicholas Lose, MD on 12/05/2021 Stage prefix: Initial diagnosis Histologic grading system: 3 grade system    12/19/2021 -  Chemotherapy   Patient is on Treatment Plan : BREAST Paclitaxel + Trastuzumab q7d / Trastuzumab q21d         RISK FACTORS:  Menarche was at age 5.  First live birth at age 26.  OCP use for approximately 0 years.  Uterus intact: no.  Menopausal status: postmenopausal.  HRT use: 10 years. Colonoscopy: yes Mammogram within the last year: yes. Any  excessive radiation exposure in the past: no  Past Medical History:  Diagnosis Date   Anxiety     Past Surgical History:  Procedure Laterality Date   ABDOMINAL HYSTERECTOMY  age 50   COLONOSCOPY N/A 05/14/2013   Procedure: COLONOSCOPY;  Surgeon: Cleotis Nipper, MD;  Location: [facility];  Service: Endoscopy;  Laterality: N/A;   EYE SURGERY Left 07/09/2011   retina detachment   FOOT SURGERY     HOT HEMOSTASIS N/A 05/14/2013   Procedure: HOT HEMOSTASIS (ARGON PLASMA COAGULATION/BICAP);  Surgeon: Cleotis Nipper, MD;  Location: [facility];  Service: Endoscopy;  Laterality: N/A;    Social History   Socioeconomic History   Marital status: Married    Spouse name: Not on file   Number of children: Not on file   Years of education: Not on file   Highest education level: Not on file  Occupational History   Not on file  Tobacco Use   Smoking status: Never   Smokeless tobacco: Never  Substance and Sexual Activity   Alcohol use: No   Drug use: No   Sexual activity: Not on file  Other Topics Concern   Not on file  Social History Narrative   Not on file   Social Determinants of Health   Financial Resource Strain: Not on file  Food Insecurity: Not on file  Transportation Needs: Not on file  Physical Activity: Not on file  Stress: Not on file  Social Connections: Not on file  FAMILY HISTORY:  We obtained a detailed, 4-generation family history.  Significant diagnoses are listed below:    Sydney Key daughter was diagnosed with breast cancer at age 21 and reportedly had negative genetic testing about 5 years ago. Her sister was diagnosed with breast cancer at age 39, she died in her 13s. Sydney Key has a niece who was diagnosed with breast cancer at age 35 and a second niece who was diagnosed with breast cancer at age 69 (the two nieces are not siblings). Her mother had cancer on her neck diagnosed at age 60, she died at 84. Sydney Key's father was diagnosed  with colon cancer at age 42, he died at 49. There is no reported Ashkenazi Jewish ancestry.   GENETIC COUNSELING ASSESSMENT: Sydney Key is a 88 y.o. female with a personal and family history of cancer which is somewhat suggestive of a hereditary predisposition to cancer. We, therefore, discussed and recommended the following at today's visit.   DISCUSSION: We discussed that 5 - 10% of cancer is hereditary, with most cases of breast cancer associated with BRCA1/2.  There are other genes that can be associated with hereditary breast cancer syndromes.  We discussed that testing is beneficial for several reasons including knowing how to follow individuals after completing their treatment, identifying whether potential treatment options would be beneficial, and understanding if other family members could be at risk for cancer and allowing them to undergo genetic testing.   We reviewed the characteristics, features and inheritance patterns of hereditary cancer syndromes. We also discussed genetic testing, including the appropriate family members to test, the process of testing, insurance coverage and turn-around-time for results. We discussed the implications of a negative, positive, carrier and/or variant of uncertain significant result. We recommended Sydney Key pursue genetic testing for a panel that includes genes associated with breast and colon cancer.   Sydney Key elected to have [facility] CustomNext Panel. The CustomNext-Cancer+RNAinsight panel offered by [facility] includes sequencing and rearrangement analysis for the following 47 genes:  APC, ATM, AXIN2, BARD1, BMPR1A, BRCA1, BRCA2, BRIP1, CDH1, CDK4, CDKN2A, CHEK2, CTNNA1, DICER1, EPCAM, GREM1, HOXB13, KIT, MEN1, MLH1, MSH2, MSH3, MSH6, MUTYH, NBN, NF1, NTHL1, PALB2, PDGFRA, PMS2, POLD1, POLE, PTEN, RAD50, RAD51C, RAD51D, SDHA, SDHB, SDHC, SDHD, SMAD4, SMARCA4, STK11, TP53, TSC1, TSC2, and VHL.  RNA data is routinely analyzed for use in variant  interpretation for all genes.  Based on Sydney Key's personal and family history of cancer, she meets medical criteria for genetic testing. Despite that she meets criteria, she may still have an out of pocket cost. We discussed that if her out of pocket cost for testing is over $100, the laboratory will call and confirm whether she wants to proceed with testing.  If the out of pocket cost of testing is less than $100 she will be billed by the genetic testing laboratory.   PLAN: After considering the risks, benefits, and limitations, Sydney Key provided informed consent to pursue genetic testing and the blood sample was sent to [facility] for analysis of the CustomNext Panel. Results should be available within approximately 2-3 weeks' time, at which point they will be disclosed by telephone to Sydney Key, as will any additional recommendations warranted by these results. Sydney Key will receive a summary of her genetic counseling visit and a copy of her results once available. This information will also be available in Epic.   Sydney Key questions were answered to her satisfaction today. Our contact information was provided should additional  questions or concerns arise. Thank you for the referral and allowing [place] to share in the care of your patient.   Lucille Passy, MS, [facility] Genetic Counselor [email or web address] (P) [phone]  The patient was seen for a total of 20 minutes in face-to-face genetic counseling.  The patient brought her two daughters.  Drs. Lindi Adie and/or Burr Medico were available to discuss this case as needed.   _______________________________________________________________________ For Office Staff:  Number of people involved in session: 3 Was an Intern/ student involved with case: no

## 2021-12-10 ENCOUNTER — Ambulatory Visit
Admission: RE | Admit: 2021-12-10 | Discharge: 2021-12-10 | Disposition: A | Discharge status: Home / Self Care | Payer: Medicare Other | Source: Ambulatory Visit | Attending: Hematology and Oncology | Admitting: Hematology and Oncology

## 2021-12-10 DIAGNOSIS — C50911 Malignant neoplasm of unspecified site of right female breast: Secondary | ICD-10-CM | POA: Diagnosis not present

## 2021-12-10 DIAGNOSIS — Z171 Estrogen receptor negative status [ER-]: Secondary | ICD-10-CM

## 2021-12-10 IMAGING — MR MR BREAST BILAT WO/W CM
8 of 12 series · 32 of 48 positions shown · IV contrast (6 ml gadavist)
Comparison: None Available.

CLINICAL DATA: 89-year-old female presenting for staging
evaluation. Recently diagnosed right breast cancer.

EXAM:
BILATERAL BREAST MRI WITH AND WITHOUT CONTRAST
TECHNIQUE: Multiplanar, multisequence MR images of both breasts were obtained
prior to and following the intravenous administration of 6 ml of
Gadavist

[Series 2: t2_tirm_tra ipat (a-p) · axial · 3.0mm · 0.70mm/px · 1 of 55 slices shown]
[im 1/55]
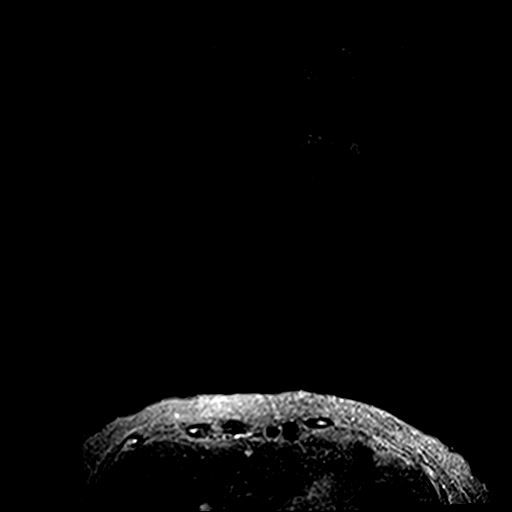

[Series 3: fl3d pre-cm no · axial · non-contrast · 1.2mm · 0.89mm/px · z∈[-74,+98]mm · 5 of 144 slices shown]
[im 1/144]
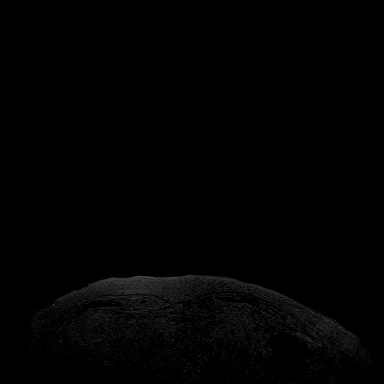
[im 36/144]
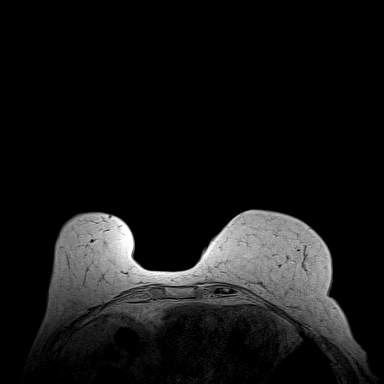
[im 72/144]
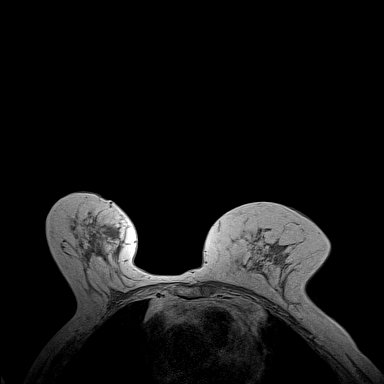
[im 108/144]
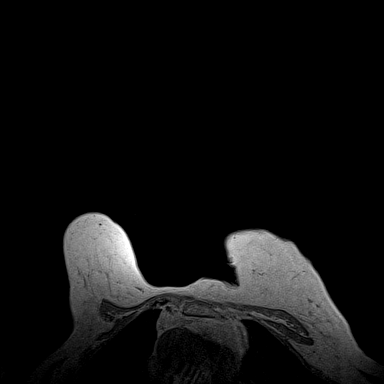
[im 144/144]
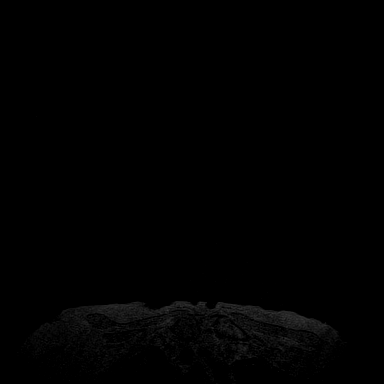

[Series 4: fl3d pre-cm · axial · non-contrast · 1.2mm · 0.89mm/px · z∈[-74,+98]mm · 5 of 144 slices shown]
[im 1/144]
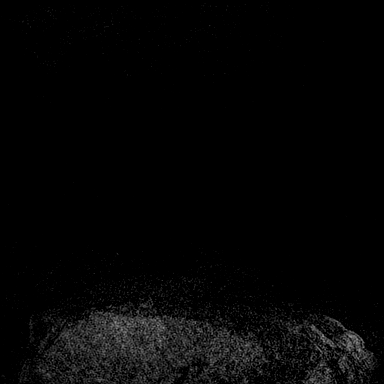
[im 36/144]
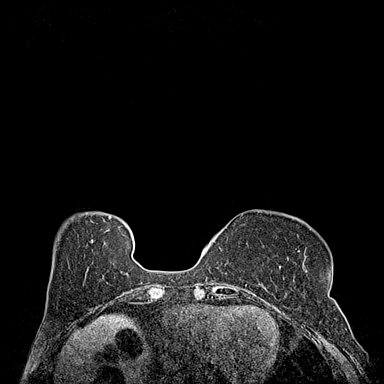
[im 72/144]
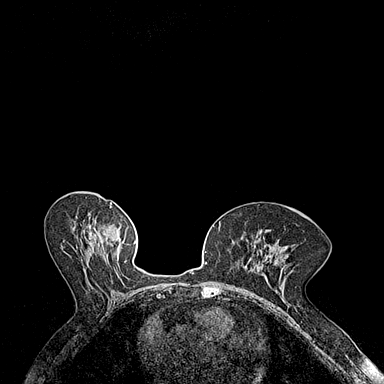
[im 108/144]
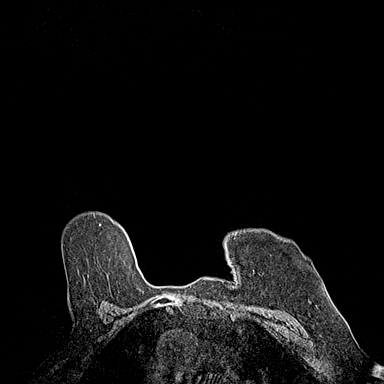
[im 144/144]
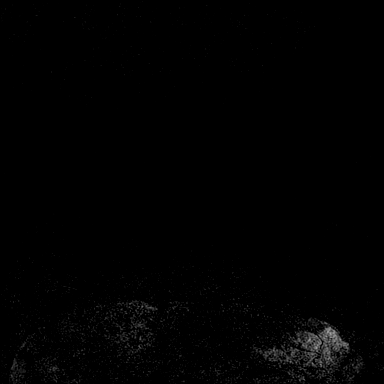

[Series 5: fl3d post-cm 20 · axial · 1.2mm · 0.89mm/px · z∈[-74,+98]mm · 5 of 144 slices shown (1 of 3)]
[im 1/144]
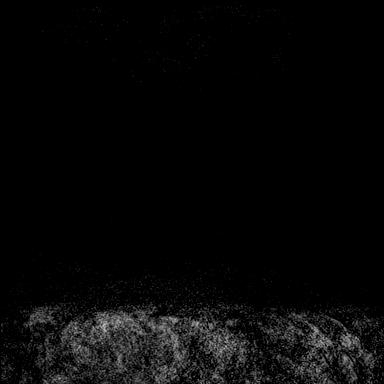
[im 36/144]
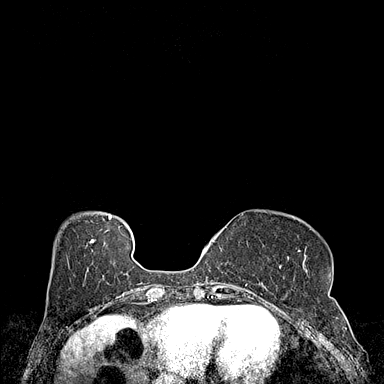
[im 72/144]
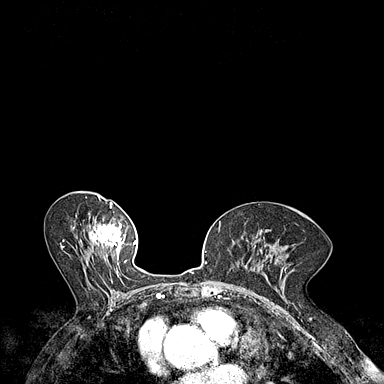
[im 108/144]
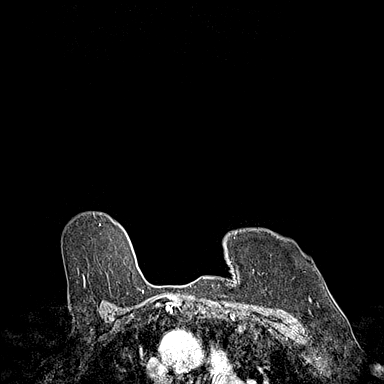
[im 144/144]
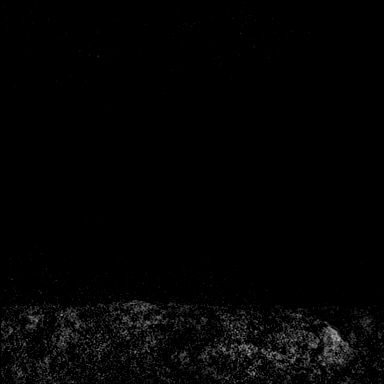

[Series 6: fl3d post-cm 20 · axial · 1.2mm · 0.89mm/px · z∈[-74,+98]mm · 5 of 144 slices shown (2 of 3)]
[im 1/144]
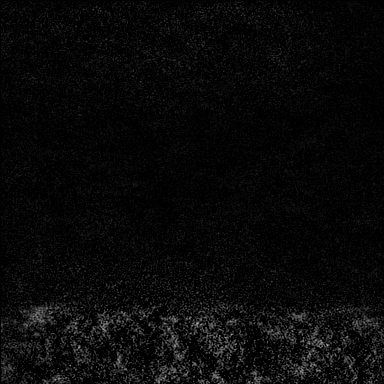
[im 36/144]
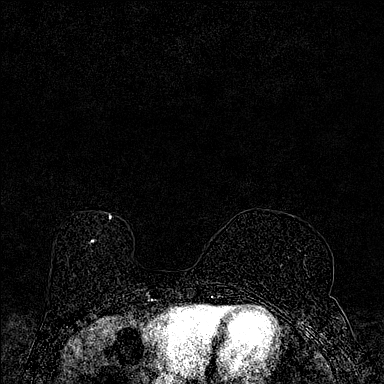
[im 72/144]
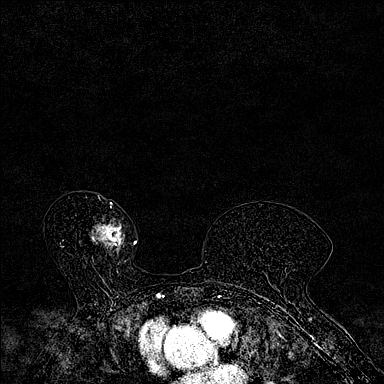
[im 108/144]
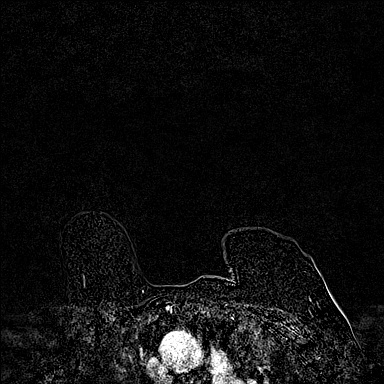
[im 144/144]
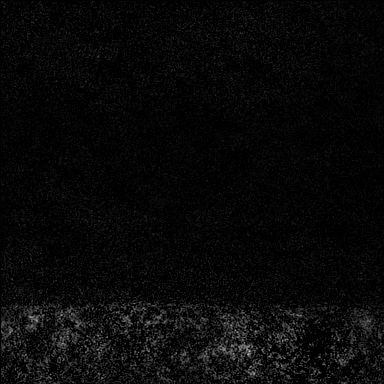

[Series 7: fl3d post-cm 20 · axial · 172.8mm · 0.89mm/px · 1 of 1 slices shown (3 of 3)]
[im 1/1]
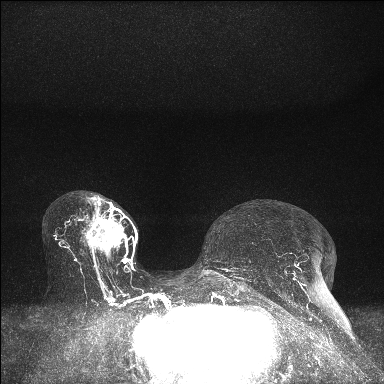

[Series 8: fl3d post-cm 3 · axial · 1.2mm · 0.89mm/px · z∈[-74,+98]mm · 6 of 144 slices shown (1 of 2)]
[im 1/144]
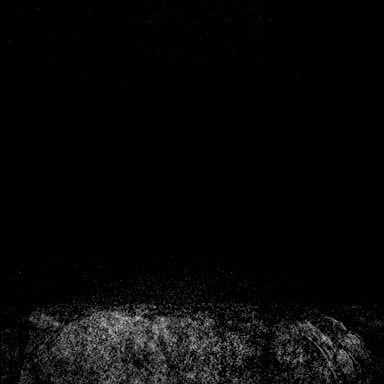
[im 29/144]
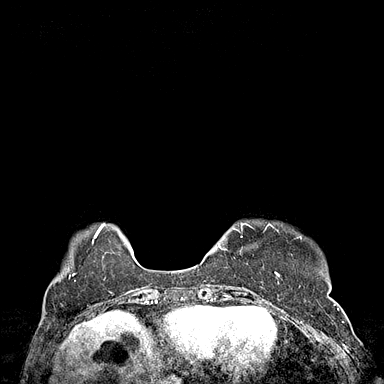
[im 58/144]
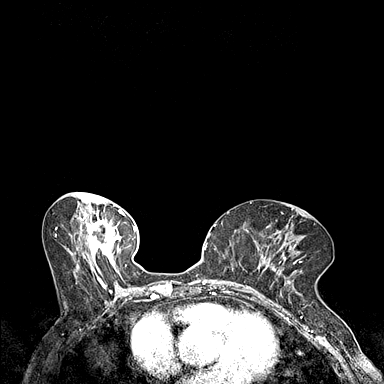
[im 86/144]
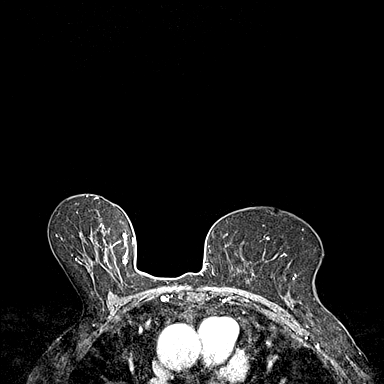
[im 115/144]
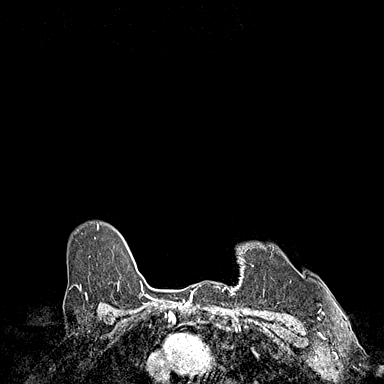
[im 144/144]
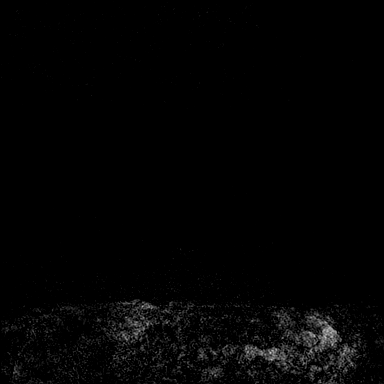

[Series 9: fl3d post-cm 3 · axial · 1.2mm · 0.89mm/px · z∈[-74,+28]mm · 4 of 144 slices shown (2 of 2)]
[im 1/144]
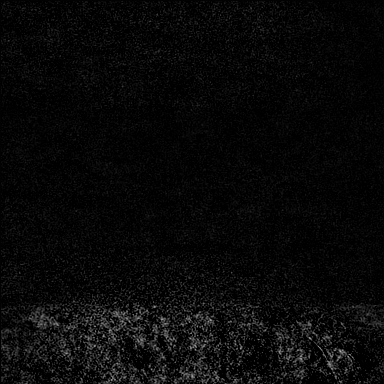
[im 29/144]
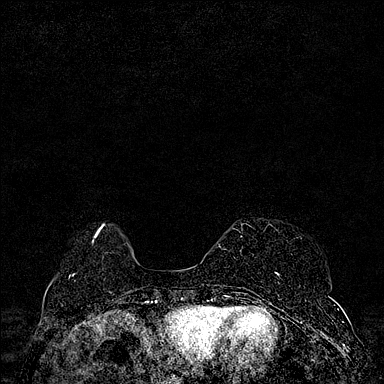
[im 58/144]
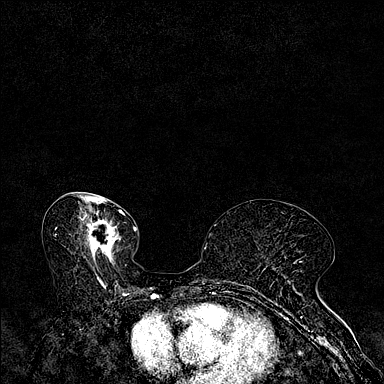
[im 86/144]
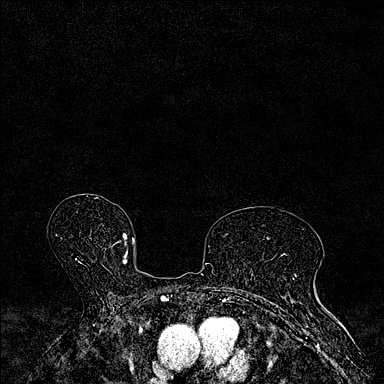

[32 of 48 positions shown; findings below may reference images not displayed]

Three-dimensional MR images were rendered by post-processing of the
original MR data on an independent workstation. The
three-dimensional MR images were interpreted, and findings are
reported in the following complete MRI report for this study. Three
dimensional images were evaluated at the independent interpreting
workstation using the DynaCAD thin client.
FINDINGS: Breast composition: b. Scattered fibroglandular tissue.

Background parenchymal enhancement: Minimal

Right breast: There is an irregular rim enhancing mass in the
central medial right breast measuring 2.8 x 3.1 x 3.1 cm (series 6,
image 80). There is abnormal non mass enhancement extending from the
mass anteriorly towards and involving the nipple. A small amount of
non mass enhancement extends posteriorly from the mass by
approximately 1 cm. There is no additional abnormal mass or non mass
enhancement in the right breast.

Left breast: No mass or abnormal enhancement.

Lymph nodes: No abnormal appearing lymph nodes.

Ancillary findings: Multiple nonenhancing T2 bright masses in the
liver most consistent with cysts.
IMPRESSION: 1. Irregular rim enhancing mass in the central medial right breast
measuring 2.8 x 3.1 x 3.1 cm consistent with biopsy proven
malignancy. There is abnormal non mass enhancement extending
anteriorly towards and involving the nipple.
2. No MRI evidence of malignancy in the left breast.

RECOMMENDATION:
If breast conservation is being considered recommend MRI guided
biopsy x1 for the non mass enhancement in the retroareolar right
breast. Otherwise proceed to surgical/clinical management for the
known right breast cancer.

BI-RADS CATEGORY  6: Known biopsy-proven malignancy.

## 2021-12-10 MED ORDER — GADOBUTROL 1 MMOL/ML IV SOLN
6.0000 mL | Freq: Once | INTRAVENOUS | Status: AC | PRN
  Administered 2021-12-10: 6 mL via INTRAVENOUS

## 2021-12-11 ENCOUNTER — Telehealth: Payer: Self-pay | Admitting: *Deleted

## 2021-12-11 NOTE — Telephone Encounter (Signed)
Left message on patient's VM to discuss recent MRI results.

## 2021-12-12 ENCOUNTER — Telehealth: Payer: Self-pay | Admitting: Emergency Medicine

## 2021-12-12 NOTE — Telephone Encounter (Signed)
[employer] 2021-05 - Specimen Collection Study to Evaluate Biomarkers in Subjects with Cancer   Called pt to f/u on interest in study.  Pt is potentially interested and agreed to meet with Research in person tomorrow (12/13/21) after chemo education appt.  Wells Guiles 'Learta CoddingNeysa Bonito, RN, BSN Clinical Research Nurse I 12/12/21 3:14 PM

## 2021-12-13 ENCOUNTER — Other Ambulatory Visit: Payer: Self-pay | Admitting: *Deleted

## 2021-12-13 ENCOUNTER — Other Ambulatory Visit: Payer: Self-pay

## 2021-12-13 ENCOUNTER — Telehealth: Payer: Self-pay | Admitting: *Deleted

## 2021-12-13 ENCOUNTER — Inpatient Hospital Stay: Payer: Medicare Other | Attending: Hematology and Oncology

## 2021-12-13 ENCOUNTER — Encounter: Payer: Self-pay | Admitting: *Deleted

## 2021-12-13 ENCOUNTER — Ambulatory Visit (HOSPITAL_COMMUNITY)
Admission: RE | Admit: 2021-12-13 | Discharge: 2021-12-13 | Disposition: A | Discharge status: Home / Self Care | Payer: Medicare Other | Source: Ambulatory Visit | Attending: Hematology and Oncology | Admitting: Hematology and Oncology

## 2021-12-13 DIAGNOSIS — C50311 Malignant neoplasm of lower-inner quadrant of right female breast: Secondary | ICD-10-CM

## 2021-12-13 DIAGNOSIS — Z171 Estrogen receptor negative status [ER-]: Secondary | ICD-10-CM | POA: Insufficient documentation

## 2021-12-13 DIAGNOSIS — I083 Combined rheumatic disorders of mitral, aortic and tricuspid valves: Secondary | ICD-10-CM | POA: Insufficient documentation

## 2021-12-13 DIAGNOSIS — R928 Other abnormal and inconclusive findings on diagnostic imaging of breast: Secondary | ICD-10-CM

## 2021-12-13 DIAGNOSIS — Z01818 Encounter for other preprocedural examination: Secondary | ICD-10-CM | POA: Diagnosis not present

## 2021-12-13 DIAGNOSIS — Z7982 Long term (current) use of aspirin: Secondary | ICD-10-CM | POA: Insufficient documentation

## 2021-12-13 DIAGNOSIS — I77810 Thoracic aortic ectasia: Secondary | ICD-10-CM | POA: Diagnosis not present

## 2021-12-13 DIAGNOSIS — Z5112 Encounter for antineoplastic immunotherapy: Secondary | ICD-10-CM | POA: Insufficient documentation

## 2021-12-13 DIAGNOSIS — Z79899 Other long term (current) drug therapy: Secondary | ICD-10-CM | POA: Insufficient documentation

## 2021-12-13 DIAGNOSIS — Z5111 Encounter for antineoplastic chemotherapy: Secondary | ICD-10-CM | POA: Insufficient documentation

## 2021-12-13 LAB — ECHOCARDIOGRAM COMPLETE
AV Peak grad: 4.1 mmHg
Ao pk vel: 1.01 m/s
Area-P 1/2: 4.21 cm2
P 1/2 time: 608 msec
S' Lateral: 3.3 cm

## 2021-12-13 NOTE — Research (Signed)
[employer] 2021-05 - Specimen Collection Study to Evaluate Biomarkers in Subjects with Cancer   Patient Sydney Key was identified by Dr Lindi Adie as a potential candidate for the above listed study. This Clinical Research Nurse met with [name], [id number] on 12/13/21 in a manner and location that ensures patient privacy to discuss participation in the above listed research study.  Patient is Accompanied by her daughter . Patient was previously provided with informed consent documents. Patient confirmed they have read the informed consent documents.  As outlined in the informed consent form, this Nurse and Moishe Spice discussed the purpose of the research study, the investigational nature of the study, study procedures and requirements for study participation, potential risks and benefits of study participation, as well as alternatives to participation. This study is not blinded or double-blinded. The patient understands participation is voluntary and they may withdraw from study participation at any time. This study does not involve randomization.  This study does not involve an investigational drug or device. This study does not involve a placebo. Patient understands enrollment is pending full eligibility review.   Confidentiality and how the patient's information will be used as part of study participation were discussed.  Patient was informed there is reimbursement provided for their time and effort spent on trial participation.  The patient is encouraged to discuss research study participation with their insurance provider to determine what costs they may incur as part of study participation, including research related injury.    All questions were answered to patient's satisfaction. The informed consent with embedded HIPAA language was reviewed page by page. The patient's mental and emotional status is appropriate to provide informed consent, and the patient verbalizes an understanding of  study participation.  Patient has agreed to participate in the above listed research study and has voluntarily signed the informed consent version 14 with embedded HIPAA language, version 14  on 12/13/21 at 1110AM.  The patient was provided with a copy of the signed informed consent form with embedded HIPAA language for their reference.  No study specific procedures were obtained prior to the signing of the informed consent document.  Approximately 15 minutes were spent with the patient reviewing the informed consent documents.  Patient was not requested to complete a Release of Information form.  Patient is having her port placed on 12/24/21 and coming for her first chemo infusion on 12/25/21. Patient agreed to have research labs drawn at the time of her treatment appointment, prior to receiving chemo.   [name], RN, BSN, CPN Clinical Research Nurse I [phone]  12/13/2021 11:22 AM

## 2021-12-13 NOTE — Telephone Encounter (Signed)
Left message for a return phone call to follow up from [facility] and assess navigation needs and also to review MRI results.

## 2021-12-14 ENCOUNTER — Other Ambulatory Visit: Payer: Self-pay | Admitting: Hematology and Oncology

## 2021-12-14 ENCOUNTER — Telehealth: Payer: Self-pay | Admitting: *Deleted

## 2021-12-14 ENCOUNTER — Encounter: Payer: Self-pay | Admitting: *Deleted

## 2021-12-14 DIAGNOSIS — R928 Other abnormal and inconclusive findings on diagnostic imaging of breast: Secondary | ICD-10-CM

## 2021-12-14 NOTE — Research (Signed)
[employer] 2021-05 - Specimen Collection Study to Evaluate Biomarkers in Subjects with Cancer   Preliminary eligibility checks performed by this Clinical Research Nurse and RN Denny Peon Cagwin on 12/13/21 before patient was consented, will do final eligibility check before blood draw on 12/25/21.  Wells Guiles 'Learta CoddingNeysa Bonito, RN, BSN Clinical Research Nurse I 12/14/21 2:55 PM

## 2021-12-14 NOTE — Telephone Encounter (Signed)
Received return phone call from patient. Review MRI and the need for add Bx which is scheduled for 6/16 and she is aware.

## 2021-12-18 NOTE — Progress Notes (Signed)
Pharmacist Chemotherapy Monitoring - Initial Assessment    Anticipated start date: 12/25/21   The following has been reviewed per standard work regarding the patient's treatment regimen: The patient's diagnosis, treatment plan and drug doses, and organ/hematologic function Lab orders and baseline tests specific to treatment regimen  The treatment plan start date, drug sequencing, and pre-medications Prior authorization status  Patient's documented medication list, including drug-drug interaction screen and prescriptions for anti-emetics and supportive care specific to the treatment regimen The drug concentrations, fluid compatibility, administration routes, and timing of the medications to be used The patient's access for treatment and lifetime cumulative dose history, if applicable  The patient's medication allergies and previous infusion related reactions, if applicable   Changes made to treatment plan:  N/A  Follow up needed:  Pending authorization for treatment    Philomena Course, [place], 12/18/2021  2:46 PM

## 2021-12-19 ENCOUNTER — Encounter: Payer: Self-pay | Admitting: Hematology and Oncology

## 2021-12-19 NOTE — Progress Notes (Signed)
Patient left voicemail regarding applying for one-time $1000 [employer].  Returned call to patient and left voicemail with my contact name and number.

## 2021-12-19 NOTE — Progress Notes (Signed)
Patient returned my call from [place] what is needed from her to apply for grant on 6/20 at next visit. She will give to registration staff to scan and email to me and they will provide her with grant paperwork to sign.  She has my card for any additional financial questions or concerns.

## 2021-12-21 ENCOUNTER — Telehealth: Payer: Self-pay | Admitting: Genetic Counselor

## 2021-12-21 ENCOUNTER — Ambulatory Visit
Admission: RE | Admit: 2021-12-21 | Discharge: 2021-12-21 | Disposition: A | Discharge status: Home / Self Care | Payer: Medicare Other | Source: Ambulatory Visit | Attending: Hematology and Oncology | Admitting: Hematology and Oncology

## 2021-12-21 ENCOUNTER — Other Ambulatory Visit: Payer: Self-pay

## 2021-12-21 ENCOUNTER — Encounter (HOSPITAL_COMMUNITY): Payer: Self-pay | Admitting: Surgery

## 2021-12-21 ENCOUNTER — Encounter: Payer: Self-pay | Admitting: Genetic Counselor

## 2021-12-21 DIAGNOSIS — N6041 Mammary duct ectasia of right breast: Secondary | ICD-10-CM | POA: Diagnosis not present

## 2021-12-21 DIAGNOSIS — Z171 Estrogen receptor negative status [ER-]: Secondary | ICD-10-CM

## 2021-12-21 DIAGNOSIS — R928 Other abnormal and inconclusive findings on diagnostic imaging of breast: Secondary | ICD-10-CM

## 2021-12-21 DIAGNOSIS — Z1379 Encounter for other screening for genetic and chromosomal anomalies: Secondary | ICD-10-CM | POA: Insufficient documentation

## 2021-12-21 DIAGNOSIS — C50911 Malignant neoplasm of unspecified site of right female breast: Secondary | ICD-10-CM | POA: Diagnosis not present

## 2021-12-21 IMAGING — MR MR BREAST BX W/ LOC DEV 1ST LEASION IMAGE BX SPEC MR GUIDE*R*
6 of 8 series · 32 of 48 positions shown · IV contrast (gadavist)
Comparison: Prior exams including the prior breast MRI dated
[DATE].
COMPARISON: Prior exams including the prior breast MRI dated
[DATE].

Addendum:
CLINICAL DATA: Patient presents for MRI guided core needle biopsy
of non mass enhancement extending from a recently biopsied right
breast carcinoma towards the right nipple.

EXAM:
MRI GUIDED CORE NEEDLE BIOPSY OF THE RIGHT BREAST
TECHNIQUE: Multiplanar, multisequence MR imaging of the right breast was
performed both before and after administration of intravenous
contrast.
CONTRAST:  7 mL of Gadavist.

[Series 2: fiducial unilateral · sagittal · 2.0mm · 1.33mm/px · 1 of 52 slices shown]
[im 1/52]
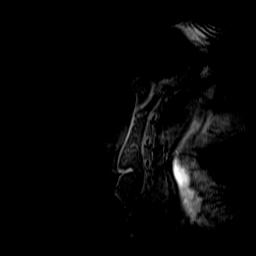

[Series 3: dynamic pre · axial · non-contrast · 1.3mm · 0.73mm/px · z∈[-68,+118]mm · 6 of 144 slices shown]
[im 1/144]
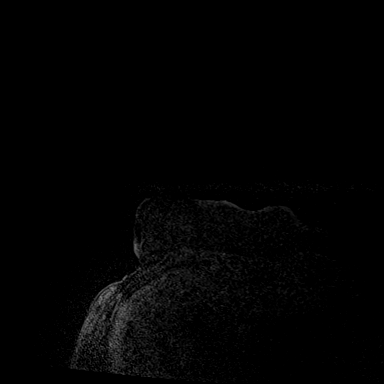
[im 29/144]
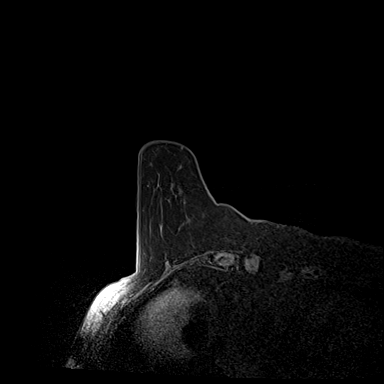
[im 58/144]
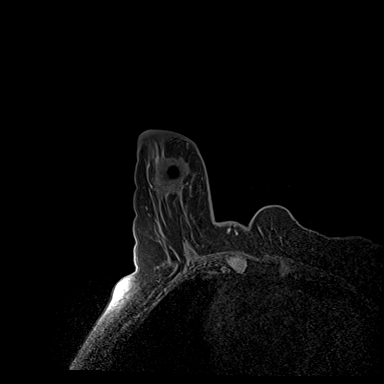
[im 86/144]
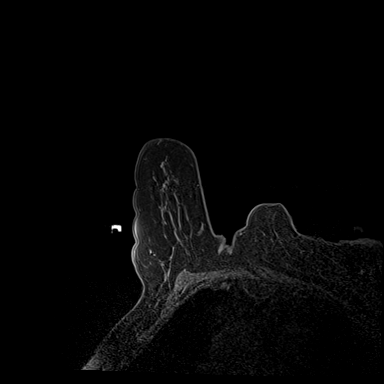
[im 115/144]
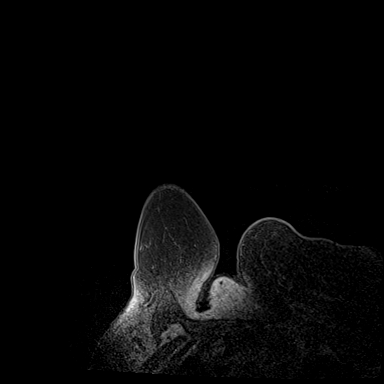
[im 144/144]
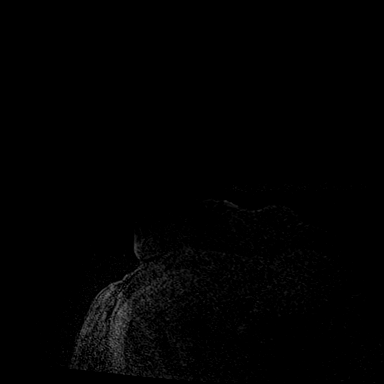

[Series 4: dynamic post 20 · axial · 1.3mm · 0.73mm/px · z∈[-68,+118]mm · 6 of 144 slices shown (1 of 2)]
[im 1/144]
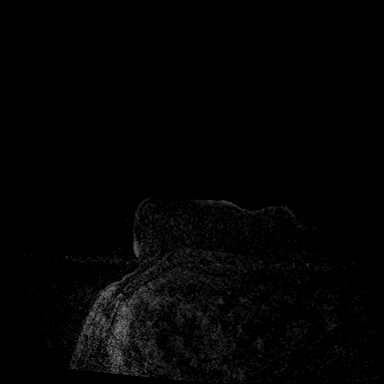
[im 29/144]
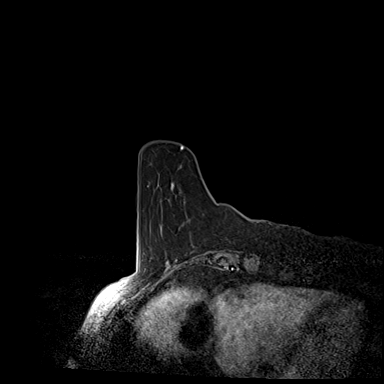
[im 58/144]
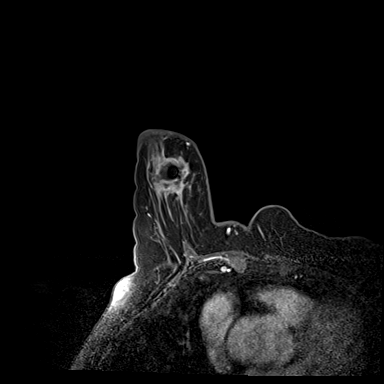
[im 86/144]
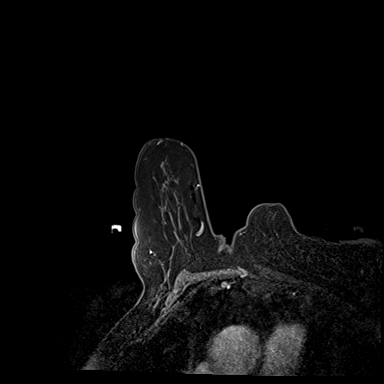
[im 115/144]
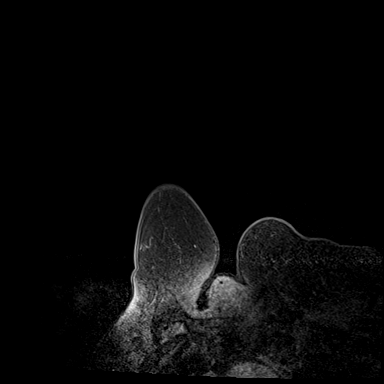
[im 144/144]
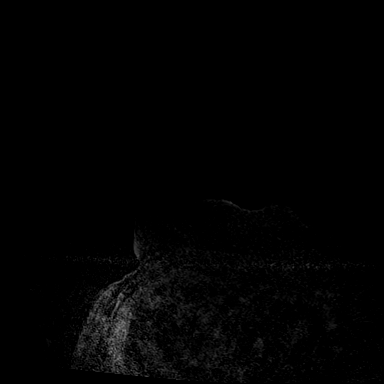

[Series 5: dynamic post 20 · axial · 1.3mm · 0.73mm/px · z∈[-68,+118]mm · 7 of 144 slices shown (2 of 2)]
[im 1/144]
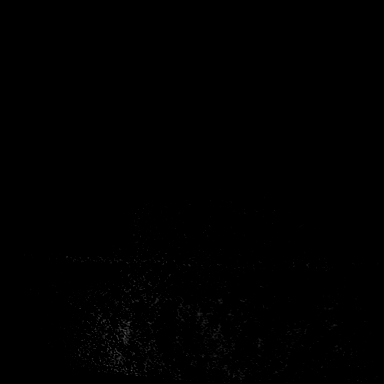
[im 24/144]
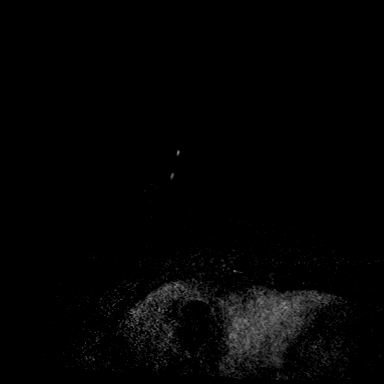
[im 48/144]
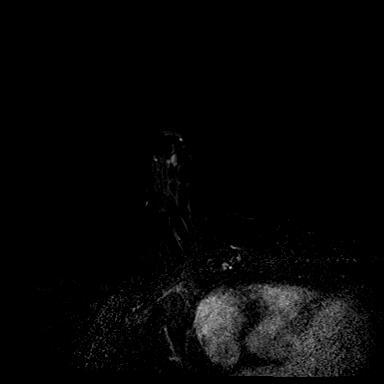
[im 72/144]
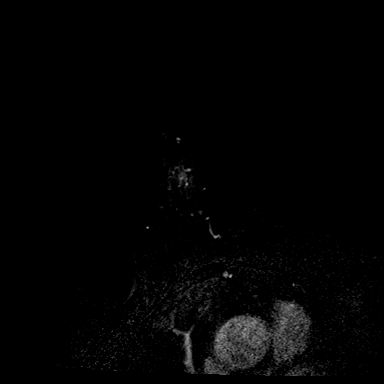
[im 96/144]
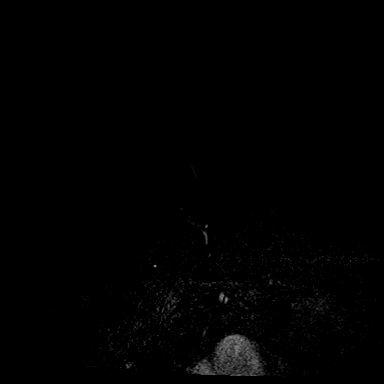
[im 120/144]
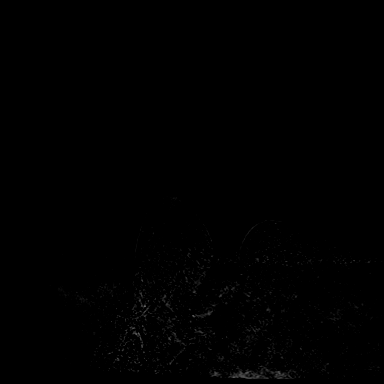
[im 144/144]
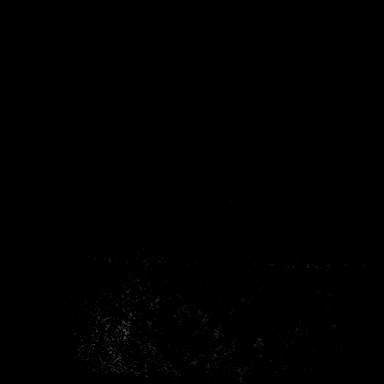

[Series 6: dynamic post 3 · axial · 1.3mm · 0.73mm/px · z∈[-68,+118]mm · 7 of 144 slices shown (1 of 2)]
[im 1/144]
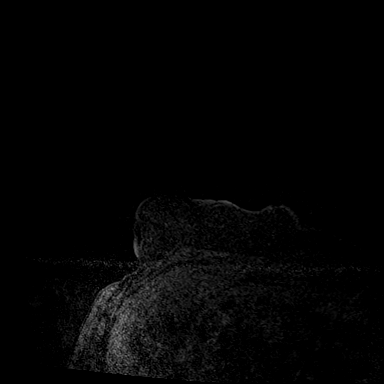
[im 24/144]
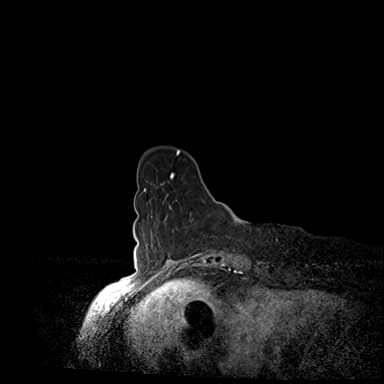
[im 48/144]
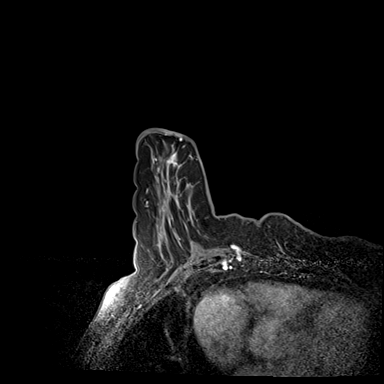
[im 72/144]
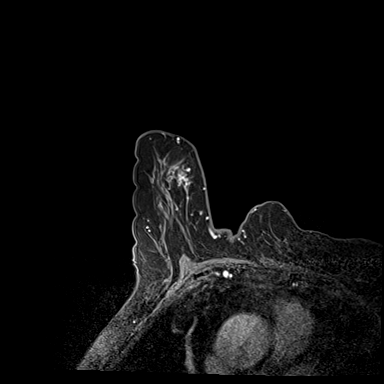
[im 96/144]
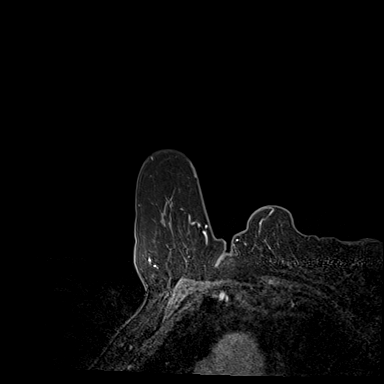
[im 120/144]
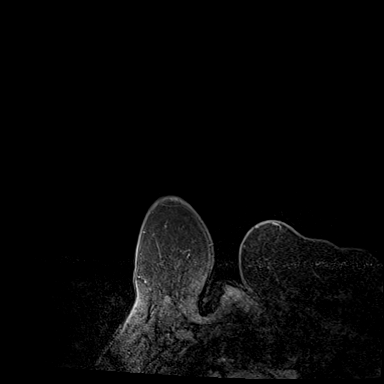
[im 144/144]
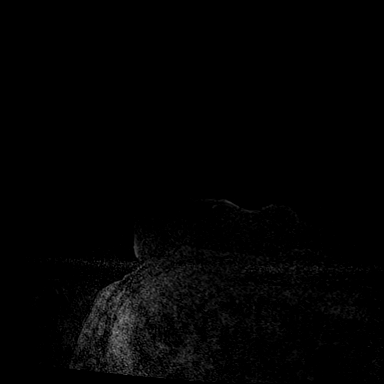

[Series 7: dynamic post 3 · axial · 1.3mm · 0.73mm/px · z∈[-68,+55]mm · 5 of 144 slices shown (2 of 2)]
[im 1/144]
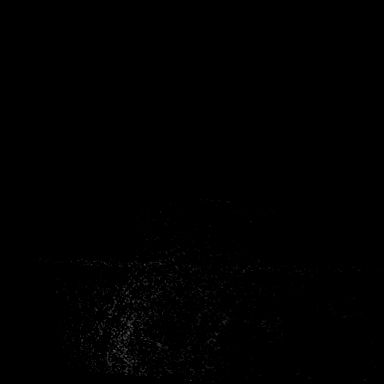
[im 24/144]
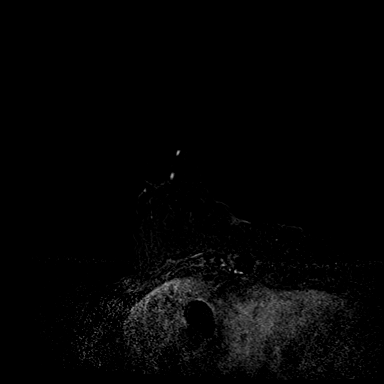
[im 48/144]
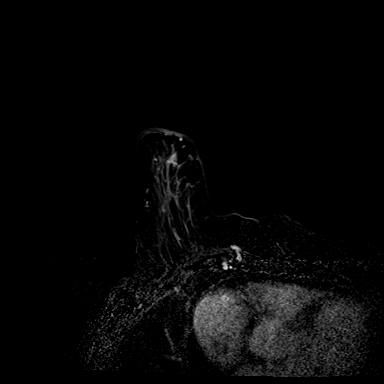
[im 72/144]
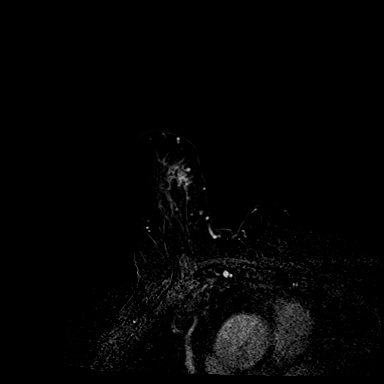
[im 96/144]
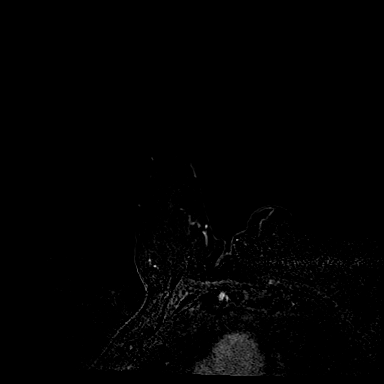

[32 of 48 positions shown; findings below may reference images not displayed]

FINDINGS: I met with the patient, and we discussed the procedure of MRI guided
biopsy, including risks, benefits, and alternatives. Specifically,
we discussed the risks of infection, bleeding, tissue injury, clip
migration, and inadequate sampling. Informed, written consent was
given. The usual time out protocol was performed immediately prior
to the procedure.

Using sterile technique, 1% Lidocaine, MRI guidance, and a 9 gauge
vacuum assisted device, biopsy was performed of the non mass
enhancement extending from the known breast carcinoma toward the
nipple using a lateral approach. At the conclusion of the procedure,
a barbell shaped tissue marker clip was deployed into the biopsy
cavity. Follow-up 2-view mammogram was performed and dictated
separately.
IMPRESSION: MRI guided biopsy of the right breast.  No apparent complications.

ADDENDUM:
Pathology revealed BENIGN BREAST TISSUE WITH LARGE CYSTICALLY
DILATED DUCTS WITH WALL FIBROSIS AND FOCAL CALCIFICATION CONSISTENT
WITH DUCT ECTASIA of the RIGHT breast, 1 o'clock, retroareolar,
(barbell clip). This was found to be concordant by Dr. OSHER.

Pathology results were discussed with the patient by telephone. The
patient reported doing well after the biopsy with tenderness at the
site. Post biopsy instructions and care were reviewed and questions
were answered. The patient was encouraged to call The [REDACTED] for any additional concerns. My direct phone
number was provided.

The patient has a recent diagnosis of RIGHT breast cancer and should
follow her outlined treatment plan.

Dr. OSHER and Dr. OSHER were notified of biopsy
results via [REDACTED] message on [DATE].

Pathology results reported by OSHER, RN on [DATE].

*** End of Addendum ***
FINDINGS: I met with the patient, and we discussed the procedure of MRI guided
biopsy, including risks, benefits, and alternatives. Specifically,
we discussed the risks of infection, bleeding, tissue injury, clip
migration, and inadequate sampling. Informed, written consent was
given. The usual time out protocol was performed immediately prior
to the procedure.

Using sterile technique, 1% Lidocaine, MRI guidance, and a 9 gauge
vacuum assisted device, biopsy was performed of the non mass
enhancement extending from the known breast carcinoma toward the
nipple using a lateral approach. At the conclusion of the procedure,
a barbell shaped tissue marker clip was deployed into the biopsy
cavity. Follow-up 2-view mammogram was performed and dictated
separately.
IMPRESSION: MRI guided biopsy of the right breast.  No apparent complications.

## 2021-12-21 IMAGING — MG MM BREAST LOCALIZATION CLIP
4 series · 4 of 12 positions shown · non-contrast
Comparison: Previous exam(s).

CLINICAL DATA: Evaluate post biopsy marker clip placement following
MRI guided core needle biopsy of non mass enhancement anterior to a
recently biopsied right breast carcinoma, extending from it to the
right nipple.

EXAM:
3D DIAGNOSTIC RIGHT MAMMOGRAM POST MRI BIOPSY

[R CC synth-2D]
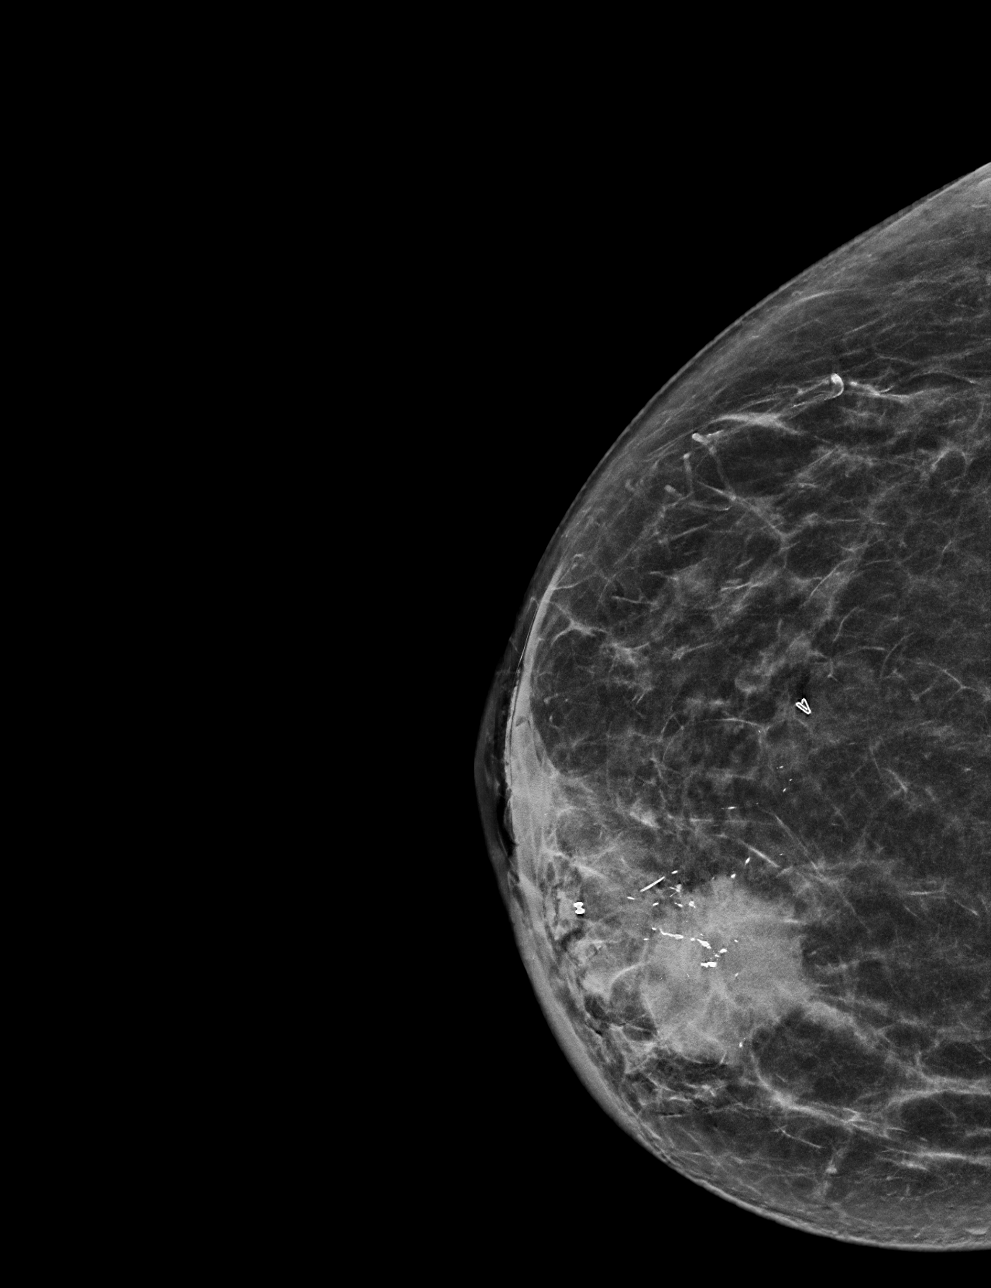

[R ML synth-2D]
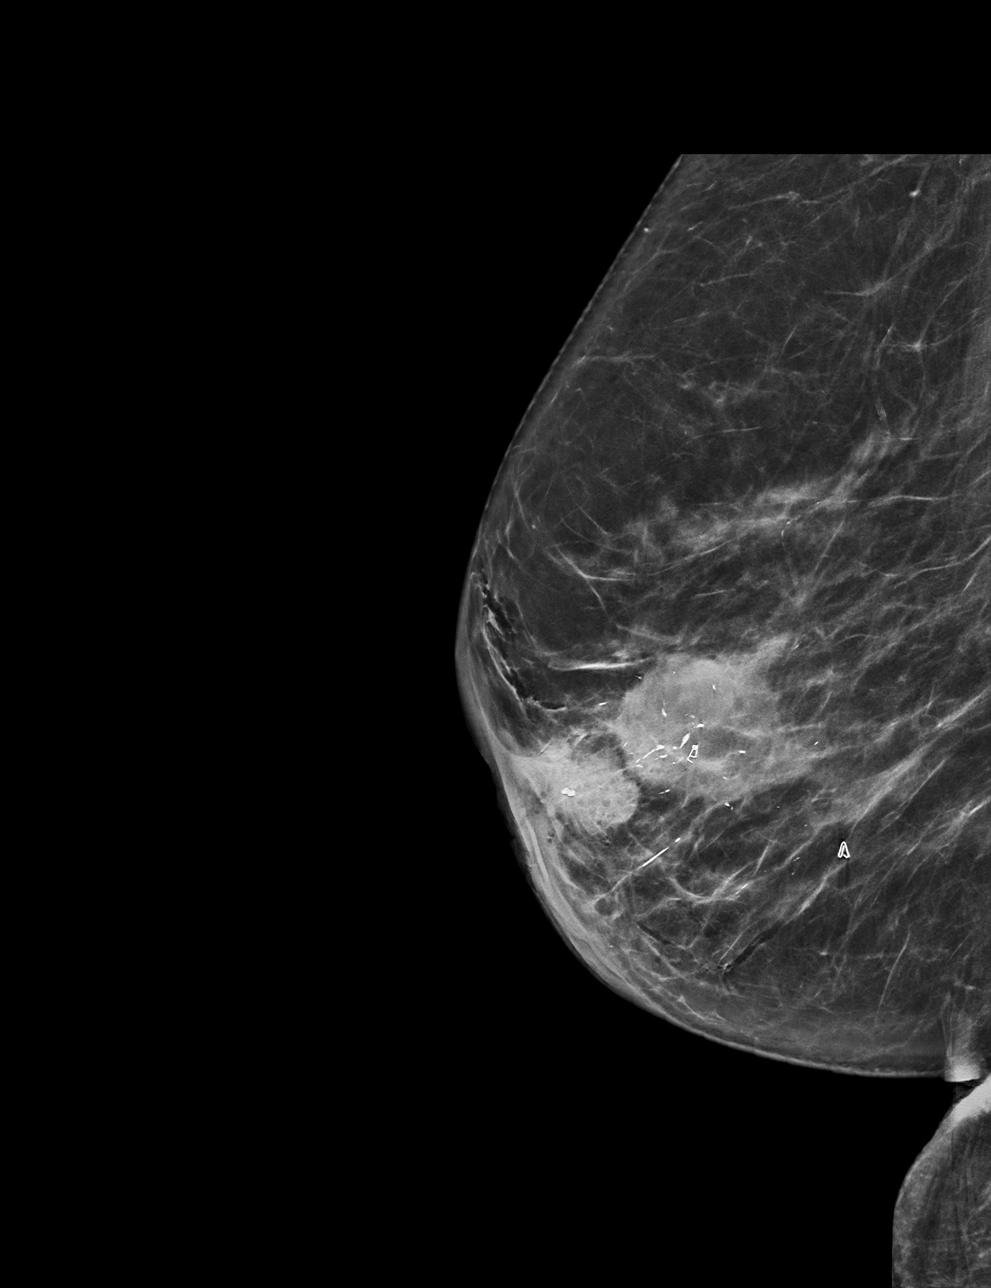

[R ML tomo · tomo slice 33/66.0]
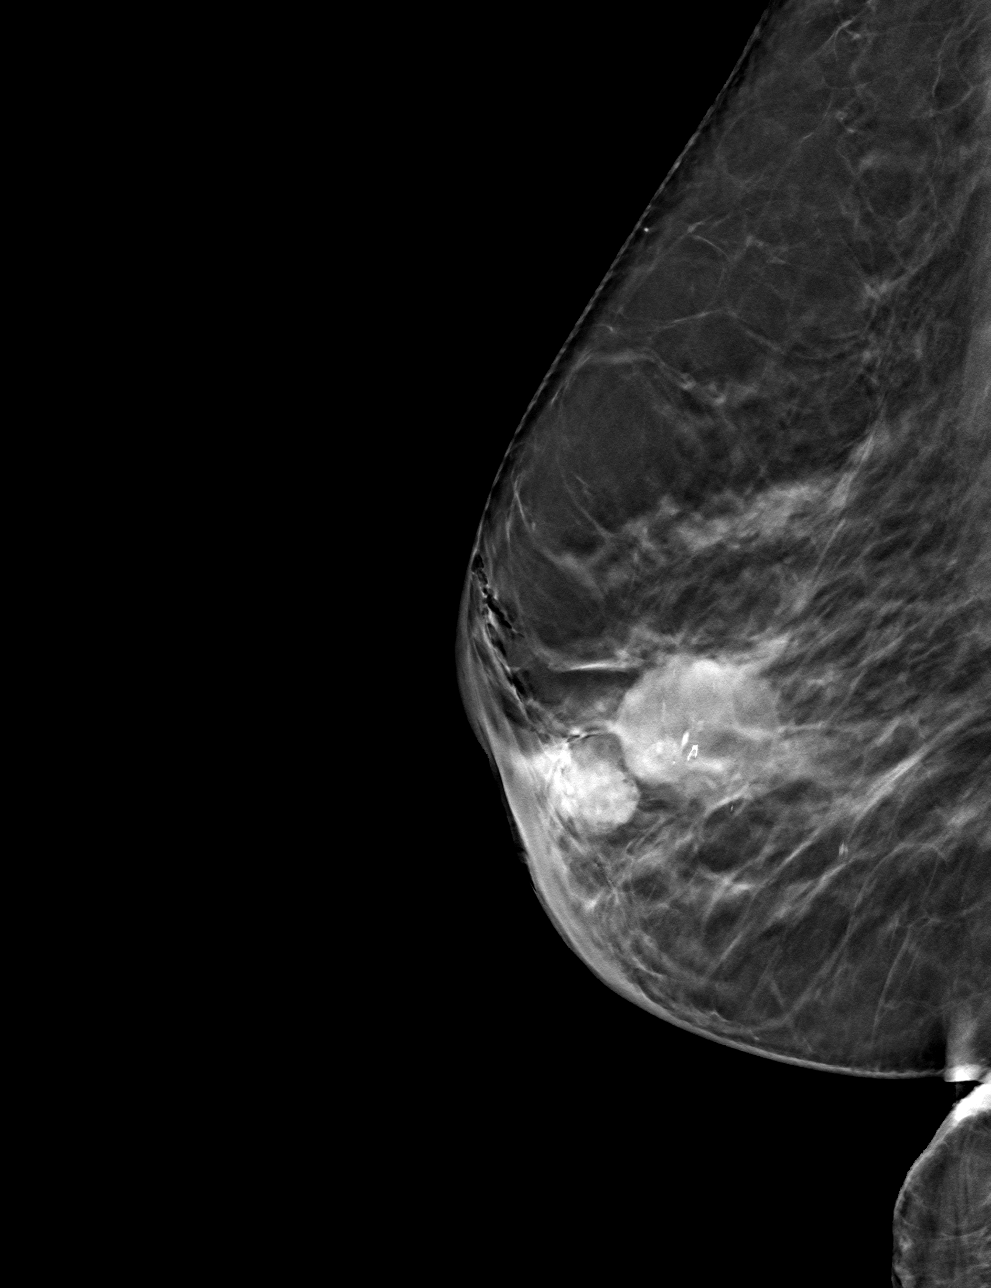

[R CC tomo · tomo slice 29/58.0]
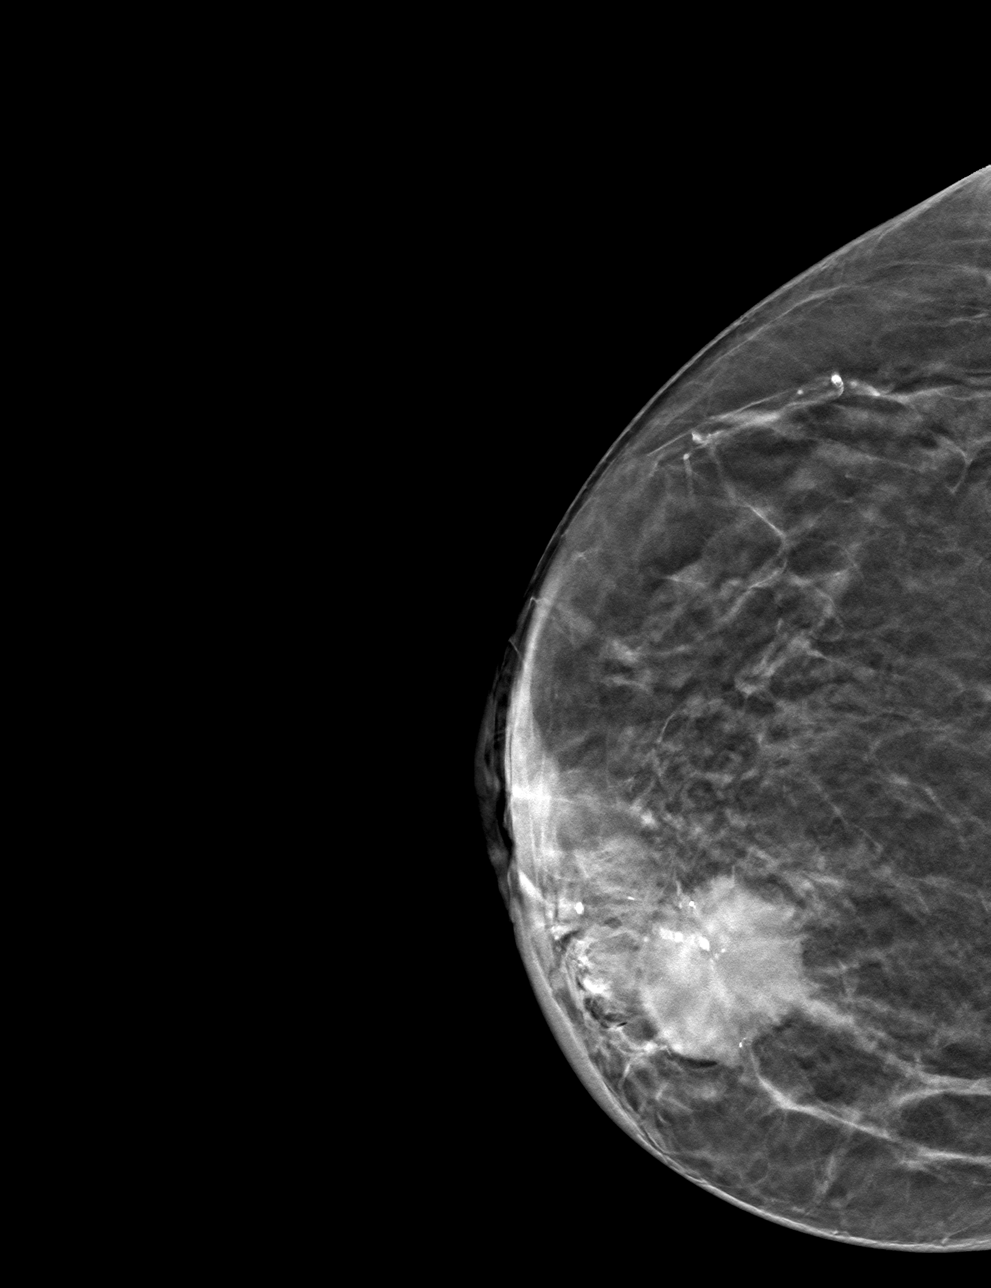

[4 of 12 positions shown; findings below may reference images not displayed]

FINDINGS: 3D Mammographic images were obtained following MRI guided biopsy of
right breast. The biopsy marking clip is in expected position at the
site of biopsy.
IMPRESSION: Appropriate positioning of the barbell shaped biopsy marking clip at
the site of biopsy in the between the previously biopsied known
right breast carcinoma and the right nipple.

Final Assessment: Post Procedure Mammograms for Marker Placement

## 2021-12-21 MED ORDER — GADOBUTROL 1 MMOL/ML IV SOLN
6.0000 mL | Freq: Once | INTRAVENOUS | Status: AC | PRN
  Administered 2021-12-21: 6 mL via INTRAVENOUS

## 2021-12-21 NOTE — Telephone Encounter (Signed)
I attempted to contact Sydney Key to discuss her genetic testing results (47 genes). I left a voicemail requesting she call me back at [phone].  Lucille Passy, MS, [facility] Genetic Counselor [email or web address] (P) [phone]

## 2021-12-21 NOTE — Progress Notes (Addendum)
PCP - Raelene Bott, MD Cardiologist - denies Oncologist - Nicholas Lose, MD  PPM/ICD - denies  Chest x-ray - 06/06/2020 EKG - 06/06/2020 Stress Test - denies ECHO - 12/13/2021 Cardiac Cath - denies  CPAP - n/a  Fasting Blood Sugar - n/a  Blood Thinner Instructions: n/a Aspirin Instructions: Patient verbalized that the last dose of Aspirin was on 12/15/2021  ERAS Protcol - yes, until 11:30 o'clock  COVID TEST- n/a  Anesthesia review: no  Patient verbally denies any shortness of breath, fever, cough and chest pain during phone call   -------------  SDW INSTRUCTIONS given:  Your procedure is scheduled on Monday, June 19th, 2023.  Report to [facility]tr[facility] "A" at 12:00 P.M., and check in at the Admitting office.  Call this number if you have problems the morning of surgery:  [phone]   Remember:  Do not eat after midnight the night before your surgery  You may drink clear liquids until 11:30 the morning of your surgery.   Clear liquids allowed are: Water, Non-Citrus Juices (without pulp), Carbonated Beverages, Clear Tea, Black Coffee Only, and Gatorade    Take these medicines the morning of surgery with A SIP OF WATER Tylenol, Alphagan PRN: Zofran, Compazine  As of today, STOP taking any Aspirin (unless otherwise instructed by your surgeon) Aleve, Naproxen, Ibuprofen, Motrin, Advil, Goody's, BC's, all herbal medications, fish oil, and all vitamins.   The day of surgery:                     Do not wear jewelry, make up, or nail polish            Do not wear lotions, powders, perfumes, or deodorant.            Do not shave 48 hours prior to surgery.              Do not bring valuables to the hospital.            [facility] is not responsible for any belongings or valuables.  Do NOT Smoke (Tobacco/Vaping) 24 hours prior to your procedure If you use a CPAP at night, you may bring all equipment for your overnight stay.   Contacts, glasses, dentures or  bridgework may not be worn into surgery.      For patients admitted to the hospital, discharge time will be determined by your treatment team.   Patients discharged the day of surgery will not be allowed to drive home, and someone needs to stay with them for 24 hours.  Special instructions:   [facility]- Preparing For Surgery  Before surgery, you can play an important role. Because skin is not sterile, your skin needs to be as free of germs as possible. You can reduce the number of germs on your skin by washing with CHG (chlorahexidine gluconate) Soap before surgery.  CHG is an antiseptic cleaner which kills germs and bonds with the skin to continue killing germs even after washing.    Oral Hygiene is also important to reduce your risk of infection.  Remember - BRUSH YOUR TEETH THE MORNING OF SURGERY WITH YOUR REGULAR TOOTHPASTE  Please do not use if you have an allergy to CHG or antibacterial soaps. If your skin becomes reddened/irritated stop using the CHG.  Do not shave (including legs and underarms) for at least 48 hours prior to first CHG shower. It is OK to shave your face.  Please follow these instructions carefully.  Shower the NIGHT BEFORE SURGERY and the MORNING OF SURGERY with DIAL Soap.   Pat yourself dry with a CLEAN TOWEL.  Wear CLEAN PAJAMAS to bed the night before surgery  Place CLEAN SHEETS on your bed the night of your first shower and DO NOT SLEEP WITH PETS.   Day of Surgery: Please shower morning of surgery  Wear Clean/Comfortable clothing the morning of surgery Do not apply any deodorants/lotions.   Remember to brush your teeth WITH YOUR REGULAR TOOTHPASTE.   Questions were answered. Patient verbalized understanding of instructions.

## 2021-12-24 ENCOUNTER — Encounter: Payer: Self-pay | Admitting: *Deleted

## 2021-12-24 NOTE — Progress Notes (Signed)
Patient called to let us know she forgot and put creamer in her coffee this morning at 0700.  Dr. Christella Hartigan and Dr. Brantley Stage notified.   Per Dr. Christella Hartigan patient would need to wait 8 hours for surgery.  This delay did not work with Dr. Josetta Huddle schedule.  Will call patient to  let her know the procedure would need to be rescheduled.

## 2021-12-25 ENCOUNTER — Encounter: Payer: Self-pay | Admitting: *Deleted

## 2021-12-25 ENCOUNTER — Inpatient Hospital Stay: Payer: Medicare Other

## 2021-12-25 ENCOUNTER — Ambulatory Visit: Payer: Medicare Other

## 2021-12-25 NOTE — Patient Instructions (Signed)
[facility]  Discharge Instructions: Thank you for choosing [facility] to provide your oncology and hematology care.   If you have a lab appointment with the [facility], please go directly to the [facility] and check in at the registration area.   Wear comfortable clothing and clothing appropriate for easy access to any Portacath or PICC line.   We strive to give you quality time with your provider. You may need to reschedule your appointment if you arrive late (15 or more minutes).  Arriving late affects you and other patients whose appointments are after yours.  Also, if you miss three or more appointments without notifying the office, you may be dismissed from the clinic at the provider's discretion.      For prescription refill requests, have your pharmacy contact our office and allow 72 hours for refills to be completed.    Today you received the following chemotherapy and/or immunotherapy agents trastuzumab, paclitaxel      To help prevent nausea and vomiting after your treatment, we encourage you to take your nausea medication as directed.  BELOW ARE SYMPTOMS THAT SHOULD BE REPORTED IMMEDIATELY: *FEVER GREATER THAN 100.4 F (38 C) OR HIGHER *CHILLS OR SWEATING *NAUSEA AND VOMITING THAT IS NOT CONTROLLED WITH YOUR NAUSEA MEDICATION *UNUSUAL SHORTNESS OF BREATH *UNUSUAL BRUISING OR BLEEDING *URINARY PROBLEMS (pain or burning when urinating, or frequent urination) *BOWEL PROBLEMS (unusual diarrhea, constipation, pain near the anus) TENDERNESS IN MOUTH AND THROAT WITH OR WITHOUT PRESENCE OF ULCERS (sore throat, sores in mouth, or a toothache) UNUSUAL RASH, SWELLING OR PAIN  UNUSUAL VAGINAL DISCHARGE OR ITCHING   Items with * indicate a potential emergency and should be followed up as soon as possible or go to the Emergency Department if any problems should occur.  Please show the CHEMOTHERAPY ALERT CARD or IMMUNOTHERAPY ALERT CARD at  check-in to the Emergency Department and triage nurse.  Should you have questions after your visit or need to cancel or reschedule your appointment, please contact [facility]  Dept: [phone]  and follow the prompts.  Office hours are 8:00 a.m. to 4:30 p.m. Monday - Friday. Please note that voicemails left after 4:00 p.m. may not be returned until the following business day.  We are closed weekends and major holidays. You have access to a nurse at all times for urgent questions. Please call the main number to the clinic Dept: [phone] and follow the prompts.   For any non-urgent questions, you may also contact your provider using MyChart. We now offer e-Visits for anyone 52 and older to request care online for non-urgent symptoms. For details visit [email or web address].   Also download the MyChart app! Go to the app store, search "MyChart", open the app, select , and log in with your MyChart username and password.  Masks are optional in the cancer centers. If you would like for your care team to wear a mask while they are taking care of you, please let them know. For doctor visits, patients may have with them one support person who is at least 86 years old. At this time, visitors are not allowed in the infusion area. Trastuzumab injection for infusion What is this medication? TRASTUZUMAB (tras TOO zoo mab) is a monoclonal antibody. It is used to treat breast cancer and stomach cancer. This medicine may be used for other purposes; ask your health care provider or pharmacist if you have questions. COMMON BRAND NAME(S): Herceptin,  Galvin Proffer, Trazimera What should I tell my care team before I take this medication? They need to know if you have any of these conditions: heart disease heart failure lung or breathing disease, like asthma an unusual or allergic reaction to trastuzumab, benzyl alcohol, or other medications,  foods, dyes, or preservatives pregnant or trying to get pregnant breast-feeding How should I use this medication? This drug is given as an infusion into a vein. It is administered in a hospital or clinic by a specially trained health care professional. Talk to your pediatrician regarding the use of this medicine in children. This medicine is not approved for use in children. Overdosage: If you think you have taken too much of this medicine contact a poison control center or emergency room at once. NOTE: This medicine is only for you. Do not share this medicine with others. What if I miss a dose? It is important not to miss a dose. Call your doctor or health care professional if you are unable to keep an appointment. What may interact with this medication? This medicine may interact with the following medications: certain types of chemotherapy, such as daunorubicin, doxorubicin, epirubicin, and idarubicin This list may not describe all possible interactions. Give your health care provider a list of all the medicines, herbs, non-prescription drugs, or dietary supplements you use. Also tell them if you smoke, drink alcohol, or use illegal drugs. Some items may interact with your medicine. What should I watch for while using this medication? Visit your doctor for checks on your progress. Report any side effects. Continue your course of treatment even though you feel ill unless your doctor tells you to stop. Call your doctor or health care professional for advice if you get a fever, chills or sore throat, or other symptoms of a cold or flu. Do not treat yourself. Try to avoid being around people who are sick. You may experience fever, chills and shaking during your first infusion. These effects are usually mild and can be treated with other medicines. Report any side effects during the infusion to your health care professional. Fever and chills usually do not happen with later infusions. Do not become  pregnant while taking this medicine or for 7 months after stopping it. Women should inform their doctor if they wish to become pregnant or think they might be pregnant. Women of child-bearing potential will need to have a negative pregnancy test before starting this medicine. There is a potential for serious side effects to an unborn child. Talk to your health care professional or pharmacist for more information. Do not breast-feed an infant while taking this medicine or for 7 months after stopping it. Women must use effective birth control with this medicine. What side effects may I notice from receiving this medication? Side effects that you should report to your doctor or health care professional as soon as possible: allergic reactions like skin rash, itching or hives, swelling of the face, lips, or tongue chest pain or palpitations cough dizziness feeling faint or lightheaded, falls fever general ill feeling or flu-like symptoms signs of worsening heart failure like breathing problems; swelling in your legs and feet unusually weak or tired Side effects that usually do not require medical attention (report to your doctor or health care professional if they continue or are bothersome): bone pain changes in taste diarrhea joint pain nausea/vomiting weight loss This list may not describe all possible side effects. Call your doctor for medical advice about side effects. You  may report side effects to FDA at 1-800-FDA-1088. Where should I keep my medication? This drug is given in a hospital or clinic and will not be stored at home. NOTE: This sheet is a summary. It may not cover all possible information. If you have questions about this medicine, talk to your doctor, pharmacist, or health care provider.  2023 Elsevier/Gold Standard (2016-07-09 00:00:00) Paclitaxel injection What is this medication? PACLITAXEL (PAK li TAX el) is a chemotherapy drug. It targets fast dividing cells, like  cancer cells, and causes these cells to die. This medicine is used to treat ovarian cancer, breast cancer, lung cancer, Kaposi's sarcoma, and other cancers. This medicine may be used for other purposes; ask your health care provider or pharmacist if you have questions. COMMON BRAND NAME(S): Onxol, Taxol What should I tell my care team before I take this medication? They need to know if you have any of these conditions: history of irregular heartbeat liver disease low blood counts, like low white cell, platelet, or red cell counts lung or breathing disease, like asthma tingling of the fingers or toes, or other nerve disorder an unusual or allergic reaction to paclitaxel, alcohol, polyoxyethylated castor oil, other chemotherapy, other medicines, foods, dyes, or preservatives pregnant or trying to get pregnant breast-feeding How should I use this medication? This drug is given as an infusion into a vein. It is administered in a hospital or clinic by a specially trained health care professional. Talk to your pediatrician regarding the use of this medicine in children. Special care may be needed. Overdosage: If you think you have taken too much of this medicine contact a poison control center or emergency room at once. NOTE: This medicine is only for you. Do not share this medicine with others. What if I miss a dose? It is important not to miss your dose. Call your doctor or health care professional if you are unable to keep an appointment. What may interact with this medication? Do not take this medicine with any of the following medications: live virus vaccines This medicine may also interact with the following medications: antiviral medicines for hepatitis, HIV or AIDS certain antibiotics like erythromycin and clarithromycin certain medicines for fungal infections like ketoconazole and itraconazole certain medicines for seizures like carbamazepine, phenobarbital,  phenytoin gemfibrozil nefazodone rifampin St. John's wort This list may not describe all possible interactions. Give your health care provider a list of all the medicines, herbs, non-prescription drugs, or dietary supplements you use. Also tell them if you smoke, drink alcohol, or use illegal drugs. Some items may interact with your medicine. What should I watch for while using this medication? Your condition will be monitored carefully while you are receiving this medicine. You will need important blood work done while you are taking this medicine. This medicine can cause serious allergic reactions. To reduce your risk you will need to take other medicine(s) before treatment with this medicine. If you experience allergic reactions like skin rash, itching or hives, swelling of the face, lips, or tongue, tell your doctor or health care professional right away. In some cases, you may be given additional medicines to help with side effects. Follow all directions for their use. This drug may make you feel generally unwell. This is not uncommon, as chemotherapy can affect healthy cells as well as cancer cells. Report any side effects. Continue your course of treatment even though you feel ill unless your doctor tells you to stop. Call your doctor or health care professional for  advice if you get a fever, chills or sore throat, or other symptoms of a cold or flu. Do not treat yourself. This drug decreases your body's ability to fight infections. Try to avoid being around people who are sick. This medicine may increase your risk to bruise or bleed. Call your doctor or health care professional if you notice any unusual bleeding. Be careful brushing and flossing your teeth or using a toothpick because you may get an infection or bleed more easily. If you have any dental work done, tell your dentist you are receiving this medicine. Avoid taking products that contain aspirin, acetaminophen, ibuprofen, naproxen,  or ketoprofen unless instructed by your doctor. These medicines may hide a fever. Do not become pregnant while taking this medicine. Women should inform their doctor if they wish to become pregnant or think they might be pregnant. There is a potential for serious side effects to an unborn child. Talk to your health care professional or pharmacist for more information. Do not breast-feed an infant while taking this medicine. Men are advised not to father a child while receiving this medicine. This product may contain alcohol. Ask your pharmacist or healthcare provider if this medicine contains alcohol. Be sure to tell all healthcare providers you are taking this medicine. Certain medicines, like metronidazole and disulfiram, can cause an unpleasant reaction when taken with alcohol. The reaction includes flushing, headache, nausea, vomiting, sweating, and increased thirst. The reaction can last from 30 minutes to several hours. What side effects may I notice from receiving this medication? Side effects that you should report to your doctor or health care professional as soon as possible: allergic reactions like skin rash, itching or hives, swelling of the face, lips, or tongue breathing problems changes in vision fast, irregular heartbeat high or low blood pressure mouth sores pain, tingling, numbness in the hands or feet signs of decreased platelets or bleeding - bruising, pinpoint red spots on the skin, black, tarry stools, blood in the urine signs of decreased red blood cells - unusually weak or tired, feeling faint or lightheaded, falls signs of infection - fever or chills, cough, sore throat, pain or difficulty passing urine signs and symptoms of liver injury like dark yellow or brown urine; general ill feeling or flu-like symptoms; light-colored stools; loss of appetite; nausea; right upper belly pain; unusually weak or tired; yellowing of the eyes or skin swelling of the ankles, feet,  hands unusually slow heartbeat Side effects that usually do not require medical attention (report to your doctor or health care professional if they continue or are bothersome): diarrhea hair loss loss of appetite muscle or joint pain nausea, vomiting pain, redness, or irritation at site where injected tiredness This list may not describe all possible side effects. Call your doctor for medical advice about side effects. You may report side effects to FDA at 1-800-FDA-1088. Where should I keep my medication? This drug is given in a hospital or clinic and will not be stored at home. NOTE: This sheet is a summary. It may not cover all possible information. If you have questions about this medicine, talk to your doctor, pharmacist, or health care provider.  2023 Elsevier/Gold Standard (2021-05-25 00:00:00)

## 2021-12-26 ENCOUNTER — Inpatient Hospital Stay: Payer: Medicare Other

## 2021-12-26 ENCOUNTER — Encounter: Payer: Self-pay | Admitting: Emergency Medicine

## 2021-12-26 ENCOUNTER — Encounter: Payer: Self-pay | Admitting: Hematology and Oncology

## 2021-12-26 ENCOUNTER — Encounter: Payer: Self-pay | Admitting: *Deleted

## 2021-12-26 ENCOUNTER — Other Ambulatory Visit: Payer: Self-pay | Admitting: Emergency Medicine

## 2021-12-26 ENCOUNTER — Inpatient Hospital Stay (HOSPITAL_BASED_OUTPATIENT_CLINIC_OR_DEPARTMENT_OTHER): Payer: Medicare Other | Admitting: Physician Assistant

## 2021-12-26 ENCOUNTER — Other Ambulatory Visit: Payer: Self-pay

## 2021-12-26 VITALS — BP 165/68 | HR 64 | Temp 98.1°F | Resp 16 | Wt 136.5 lb

## 2021-12-26 DIAGNOSIS — C50311 Malignant neoplasm of lower-inner quadrant of right female breast: Secondary | ICD-10-CM | POA: Diagnosis not present

## 2021-12-26 DIAGNOSIS — Z171 Estrogen receptor negative status [ER-]: Secondary | ICD-10-CM

## 2021-12-26 DIAGNOSIS — Z79899 Other long term (current) drug therapy: Secondary | ICD-10-CM | POA: Diagnosis not present

## 2021-12-26 DIAGNOSIS — Z5112 Encounter for antineoplastic immunotherapy: Secondary | ICD-10-CM | POA: Diagnosis not present

## 2021-12-26 DIAGNOSIS — Z7982 Long term (current) use of aspirin: Secondary | ICD-10-CM | POA: Diagnosis not present

## 2021-12-26 DIAGNOSIS — T50905A Adverse effect of unspecified drugs, medicaments and biological substances, initial encounter: Secondary | ICD-10-CM | POA: Diagnosis not present

## 2021-12-26 DIAGNOSIS — Z5111 Encounter for antineoplastic chemotherapy: Secondary | ICD-10-CM | POA: Diagnosis not present

## 2021-12-26 LAB — CBC WITH DIFFERENTIAL/PLATELET
Abs Immature Granulocytes: 0.02 10*3/uL (ref 0.00–0.07)
Basophils Absolute: 0 10*3/uL (ref 0.0–0.1)
Basophils Relative: 1 %
Eosinophils Absolute: 0.1 10*3/uL (ref 0.0–0.5)
Eosinophils Relative: 1 %
HCT: 35.8 % — ABNORMAL LOW (ref 36.0–46.0)
Hemoglobin: 12 g/dL (ref 12.0–15.0)
Immature Granulocytes: 1 %
Lymphocytes Relative: 28 %
Lymphs Abs: 1.2 10*3/uL (ref 0.7–4.0)
MCH: 31.4 pg (ref 26.0–34.0)
MCHC: 33.5 g/dL (ref 30.0–36.0)
MCV: 93.7 fL (ref 80.0–100.0)
Monocytes Absolute: 0.4 10*3/uL (ref 0.1–1.0)
Monocytes Relative: 9 %
Neutro Abs: 2.6 10*3/uL (ref 1.7–7.7)
Neutrophils Relative %: 60 %
Platelets: 201 10*3/uL (ref 150–400)
RBC: 3.82 MIL/uL — ABNORMAL LOW (ref 3.87–5.11)
RDW: 12.7 % (ref 11.5–15.5)
WBC: 4.3 10*3/uL (ref 4.0–10.5)
nRBC: 0 % (ref 0.0–0.2)

## 2021-12-26 LAB — COMPREHENSIVE METABOLIC PANEL WITH GFR
ALT: 13 U/L (ref 0–44)
AST: 16 U/L (ref 15–41)
Albumin: 4.1 g/dL (ref 3.5–5.0)
Alkaline Phosphatase: 56 U/L (ref 38–126)
Anion gap: 5 (ref 5–15)
BUN: 18 mg/dL (ref 8–23)
CO2: 27 mmol/L (ref 22–32)
Calcium: 9.6 mg/dL (ref 8.9–10.3)
Chloride: 108 mmol/L (ref 98–111)
Creatinine, Ser: 0.72 mg/dL (ref 0.44–1.00)
GFR, Estimated: >60 mL/min
Glucose, Bld: 98 mg/dL (ref 70–99)
Potassium: 4.2 mmol/L (ref 3.5–5.1)
Sodium: 140 mmol/L (ref 135–145)
Total Bilirubin: 1 mg/dL (ref 0.3–1.2)
Total Protein: 6.8 g/dL (ref 6.5–8.1)

## 2021-12-26 LAB — RESEARCH LABS

## 2021-12-26 MED ORDER — SODIUM CHLORIDE 0.9 % IV SOLN: Freq: Once | INTRAVENOUS | Status: AC

## 2021-12-26 MED ORDER — ACETAMINOPHEN 325 MG PO TABS
650.0000 mg | ORAL_TABLET | Freq: Once | ORAL | Status: AC
  Administered 2021-12-26: 650 mg via ORAL
  Filled 2021-12-26: qty 2

## 2021-12-26 MED ORDER — FAMOTIDINE IN NACL 20-0.9 MG/50ML-% IV SOLN
20.0000 mg | Freq: Once | INTRAVENOUS | Status: AC
  Administered 2021-12-26: 20 mg via INTRAVENOUS
  Filled 2021-12-26: qty 50

## 2021-12-26 MED ORDER — SODIUM CHLORIDE 0.9 % IV SOLN
50.0000 mg/m2 | Freq: Once | INTRAVENOUS | Status: AC
  Administered 2021-12-26: 84 mg via INTRAVENOUS
  Filled 2021-12-26: qty 14

## 2021-12-26 MED ORDER — TRASTUZUMAB-DKST CHEMO 150 MG IV SOLR
4.0000 mg/kg | Freq: Once | INTRAVENOUS | Status: AC
  Administered 2021-12-26: 252 mg via INTRAVENOUS
  Filled 2021-12-26: qty 12

## 2021-12-26 MED ORDER — DIPHENHYDRAMINE HCL 50 MG/ML IJ SOLN
12.5000 mg | Freq: Once | INTRAMUSCULAR | Status: AC | PRN
  Administered 2021-12-26: 12.5 mg via INTRAVENOUS

## 2021-12-26 MED ORDER — SODIUM CHLORIDE 0.9 % IV SOLN: Freq: Once | INTRAVENOUS | Status: DC | PRN

## 2021-12-26 MED ORDER — DIPHENHYDRAMINE HCL 50 MG/ML IJ SOLN
12.5000 mg | Freq: Once | INTRAMUSCULAR | Status: AC
  Administered 2021-12-26: 12.5 mg via INTRAVENOUS
  Filled 2021-12-26: qty 1

## 2021-12-26 NOTE — Progress Notes (Signed)
Patient presented her documents at registration for [employer].  She was approved for the one-time $1000 grant to assist with personal expenses while going through treatment.   Pt. signed grant paperwork and was given expense sheet and a copy of paperwork for her records. She received a gift card today from her grant. She was advised to call me later or at her earliest convenience to discuss other grant details. She has my card to do so and for any other financial questions or concerns.

## 2021-12-26 NOTE — Research (Unsigned)
[employer] 2021-05 - Specimen Collection Study to Evaluate Biomarkers in Subjects with Cancer    12/26/2021  SECOND REVIEW:  This Coordinator has reviewed this patient's inclusion and exclusion criteria and confirmed Sydney Key is eligible for study participation.  Patient will continue with enrollment.  Menopausal status (women only): Sydney Key is post-menopausal.  Eligibility confirmed by research nurse and treating investigator, who also agrees that patient should proceed with enrollment.   Carol Ada, RT(R)(T) Clinical Research Coordinator

## 2021-12-26 NOTE — Research (Unsigned)
[employer] 2021-05 - Specimen Collection Study to Evaluate Biomarkers in Subjects with Cancer  ELIGIBILITY CHECK  Patient confirmed the following verbally today: - They are willing/able to comply with all study procedures. - They have not been/are not currently enrolled in another [employer] example collection study or another cohort within this study   Reviewed exclusion criteria for 'all subjects' as well as 'treatment naive cohort' with patient today who verbally denied all.  Per MD Lindi Adie patient has no condition that would interfere with ability to fully participate in enrollment or protocol.  PATIENT HISTORY  Medical History:  High Blood Pressure  No Coronary Artery Disease No Lupus    No Rheumatoid Arthritis  No Diabetes   No      If yes, which type?      N/A Lynch Syndrome  No  Is the patient currently taking a magnesium supplement?   Yes If yes, dose and frequency? 42 mg/day (takes women's 1 a day multivitamin supplement) Indication? Supplement Start date? 04/26/13  Does the patient have a personal history of cancer (greater than 5 years ago)?  No If yes, Cancer type and date of diagnosis?   N/A  Has this previous diagnosis been treated? N/A      If so, treatment type? N/A   Start and end dates of last treatment cycle? N/A  Does the patient have a family history of cancer in 1st or 2nd degree relatives? Yes If yes, Relationship(s) and Cancer type(s)? Mother (unknown), Father (colon), Sister (breast), Daughter (breast), Niece (breast), Niece (breast)  Does the patient have history of alcohol consumption? No   If yes, current or former? N/A If former, year stopped? N/A Number of years? N/A Drinks per week? N/A  Does the patient have history of cigarette, cigar, pipe, or chewing tobacco use?  No  If yes, current for former? N/A If yes, type (Cigarette, cigar, pipe, and/or chewing tobacco)? N/A   If former, year stopped? N/A Number of years?  N/A Packs/number/containers per day? N/A  ENROLLMENT  Eligibility: Eligibility criteria reviewed with patient. This Nurse has reviewed this patient's inclusion and exclusion criteria and confirmed patient is eligible for study participation. Eligibility confirmed by treating investigator, who also agrees that patient should proceed with enrollment. Patient will continue with enrollment. Blood Collection: Research blood obtained by [facility] venipuncture per patient's preference. Patient tolerated well without any adverse events. Gift Card: $50 gift card given to patient for her participation in this study.   Wells Guiles 'Learta CoddingNeysa Bonito, RN, BSN Clinical Research Nurse I 12/26/21 10:07 AM

## 2021-12-26 NOTE — Progress Notes (Signed)
Hypersensitivity Reaction note  Date of event: 12/26/21 Time of event: 1339 Generic name of drug involved: paclitaxel Name of provider notified of the hypersensitivity reaction: Milinda Cave, PA/Vinay Lindi Adie, MD Was agent that likely caused hypersensitivity reaction added to Allergies List within EMR? Y   Chain of events including reaction signs/symptoms, treatment administered, and outcome (e.g., drug resumed; drug discontinued; sent to Emergency Department; etc.)  Patient c/o mid and lower back pain at 1339.  RN stopped chemo infusion.  [facility] PA notified.  NS started wide open to gravity.  VSS.  PA notified MD for further instructions.  Per MD, give Benadryl 12.'5mg'$  once and restart.  Benadryl given at 1352.  Chemotherapy restarted at 1356.  No further reaction symptoms or side effects noted.  Patient able to complete infusion.    Wylene Men, RN 12/26/2021 3:28 PM

## 2021-12-26 NOTE — Patient Instructions (Addendum)
[facility]  Discharge Instructions: Thank you for choosing [facility] to provide your oncology and hematology care.   If you have a lab appointment with the [facility], please go directly to the [facility] and check in at the registration area.   Wear comfortable clothing and clothing appropriate for easy access to any Portacath or PICC line.   We strive to give you quality time with your provider. You may need to reschedule your appointment if you arrive late (15 or more minutes).  Arriving late affects you and other patients whose appointments are after yours.  Also, if you miss three or more appointments without notifying the office, you may be dismissed from the clinic at the provider's discretion.      For prescription refill requests, have your pharmacy contact our office and allow 72 hours for refills to be completed.    Today you received the following chemotherapy and/or immunotherapy agents: trastuzumab-dkst (Ogivri), paclitaxel (Taxol)      To help prevent nausea and vomiting after your treatment, we encourage you to take your nausea medication as directed.  BELOW ARE SYMPTOMS THAT SHOULD BE REPORTED IMMEDIATELY: *FEVER GREATER THAN 100.4 F (38 C) OR HIGHER *CHILLS OR SWEATING *NAUSEA AND VOMITING THAT IS NOT CONTROLLED WITH YOUR NAUSEA MEDICATION *UNUSUAL SHORTNESS OF BREATH *UNUSUAL BRUISING OR BLEEDING *URINARY PROBLEMS (pain or burning when urinating, or frequent urination) *BOWEL PROBLEMS (unusual diarrhea, constipation, pain near the anus) TENDERNESS IN MOUTH AND THROAT WITH OR WITHOUT PRESENCE OF ULCERS (sore throat, sores in mouth, or a toothache) UNUSUAL RASH, SWELLING OR PAIN  UNUSUAL VAGINAL DISCHARGE OR ITCHING   Items with * indicate a potential emergency and should be followed up as soon as possible or go to the Emergency Department if any problems should occur.  Please show the CHEMOTHERAPY ALERT CARD or  IMMUNOTHERAPY ALERT CARD at check-in to the Emergency Department and triage nurse.  Should you have questions after your visit or need to cancel or reschedule your appointment, please contact [facility]  Dept: [phone]  and follow the prompts.  Office hours are 8:00 a.m. to 4:30 p.m. Monday - Friday. Please note that voicemails left after 4:00 p.m. may not be returned until the following business day.  We are closed weekends and major holidays. You have access to a nurse at all times for urgent questions. Please call the main number to the clinic Dept: [phone] and follow the prompts.   For any non-urgent questions, you may also contact your provider using MyChart. We now offer e-Visits for anyone 29 and older to request care online for non-urgent symptoms. For details visit [email or web address].   Also download the MyChart app! Go to the app store, search "MyChart", open the app, select [facility], and log in with your MyChart username and password.  Masks are optional in the cancer centers. If you would like for your care team to wear a mask while they are taking care of you, please let them know. For doctor visits, patients may have with them one support person who is at least 86 years old. At this time, visitors are not allowed in the infusion area. Trastuzumab injection for infusion What is this medication? TRASTUZUMAB (tras TOO zoo mab) is a monoclonal antibody. It is used to treat breast cancer and stomach cancer. This medicine may be used for other purposes; ask your health care provider or pharmacist if you have questions. COMMON BRAND  NAME(S): Herceptin, Janae Bridgeman, Ontruzant, Trazimera What should I tell my care team before I take this medication? They need to know if you have any of these conditions: heart disease heart failure lung or breathing disease, like asthma an unusual or allergic reaction to trastuzumab, benzyl  alcohol, or other medications, foods, dyes, or preservatives pregnant or trying to get pregnant breast-feeding How should I use this medication? This drug is given as an infusion into a vein. It is administered in a hospital or clinic by a specially trained health care professional. Talk to your pediatrician regarding the use of this medicine in children. This medicine is not approved for use in children. Overdosage: If you think you have taken too much of this medicine contact a poison control center or emergency room at once. NOTE: This medicine is only for you. Do not share this medicine with others. What if I miss a dose? It is important not to miss a dose. Call your doctor or health care professional if you are unable to keep an appointment. What may interact with this medication? This medicine may interact with the following medications: certain types of chemotherapy, such as daunorubicin, doxorubicin, epirubicin, and idarubicin This list may not describe all possible interactions. Give your health care provider a list of all the medicines, herbs, non-prescription drugs, or dietary supplements you use. Also tell them if you smoke, drink alcohol, or use illegal drugs. Some items may interact with your medicine. What should I watch for while using this medication? Visit your doctor for checks on your progress. Report any side effects. Continue your course of treatment even though you feel ill unless your doctor tells you to stop. Call your doctor or health care professional for advice if you get a fever, chills or sore throat, or other symptoms of a cold or flu. Do not treat yourself. Try to avoid being around people who are sick. You may experience fever, chills and shaking during your first infusion. These effects are usually mild and can be treated with other medicines. Report any side effects during the infusion to your health care professional. Fever and chills usually do not happen with  later infusions. Do not become pregnant while taking this medicine or for 7 months after stopping it. Women should inform their doctor if they wish to become pregnant or think they might be pregnant. Women of child-bearing potential will need to have a negative pregnancy test before starting this medicine. There is a potential for serious side effects to an unborn child. Talk to your health care professional or pharmacist for more information. Do not breast-feed an infant while taking this medicine or for 7 months after stopping it. Women must use effective birth control with this medicine. What side effects may I notice from receiving this medication? Side effects that you should report to your doctor or health care professional as soon as possible: allergic reactions like skin rash, itching or hives, swelling of the face, lips, or tongue chest pain or palpitations cough dizziness feeling faint or lightheaded, falls fever general ill feeling or flu-like symptoms signs of worsening heart failure like breathing problems; swelling in your legs and feet unusually weak or tired Side effects that usually do not require medical attention (report to your doctor or health care professional if they continue or are bothersome): bone pain changes in taste diarrhea joint pain nausea/vomiting weight loss This list may not describe all possible side effects. Call your doctor for medical advice about side  effects. You may report side effects to FDA at 1-800-FDA-1088. Where should I keep my medication? This drug is given in a hospital or clinic and will not be stored at home. NOTE: This sheet is a summary. It may not cover all possible information. If you have questions about this medicine, talk to your doctor, pharmacist, or health care provider.  2023 Elsevier/Gold Standard (2016-07-09 00:00:00) Paclitaxel injection What is this medication? PACLITAXEL (PAK li TAX el) is a chemotherapy drug. It targets  fast dividing cells, like cancer cells, and causes these cells to die. This medicine is used to treat ovarian cancer, breast cancer, lung cancer, Kaposi's sarcoma, and other cancers. This medicine may be used for other purposes; ask your health care provider or pharmacist if you have questions. COMMON BRAND NAME(S): Onxol, Taxol What should I tell my care team before I take this medication? They need to know if you have any of these conditions: history of irregular heartbeat liver disease low blood counts, like low white cell, platelet, or red cell counts lung or breathing disease, like asthma tingling of the fingers or toes, or other nerve disorder an unusual or allergic reaction to paclitaxel, alcohol, polyoxyethylated castor oil, other chemotherapy, other medicines, foods, dyes, or preservatives pregnant or trying to get pregnant breast-feeding How should I use this medication? This drug is given as an infusion into a vein. It is administered in a hospital or clinic by a specially trained health care professional. Talk to your pediatrician regarding the use of this medicine in children. Special care may be needed. Overdosage: If you think you have taken too much of this medicine contact a poison control center or emergency room at once. NOTE: This medicine is only for you. Do not share this medicine with others. What if I miss a dose? It is important not to miss your dose. Call your doctor or health care professional if you are unable to keep an appointment. What may interact with this medication? Do not take this medicine with any of the following medications: live virus vaccines This medicine may also interact with the following medications: antiviral medicines for hepatitis, HIV or AIDS certain antibiotics like erythromycin and clarithromycin certain medicines for fungal infections like ketoconazole and itraconazole certain medicines for seizures like carbamazepine, phenobarbital,  phenytoin gemfibrozil nefazodone rifampin St. John's wort This list may not describe all possible interactions. Give your health care provider a list of all the medicines, herbs, non-prescription drugs, or dietary supplements you use. Also tell them if you smoke, drink alcohol, or use illegal drugs. Some items may interact with your medicine. What should I watch for while using this medication? Your condition will be monitored carefully while you are receiving this medicine. You will need important blood work done while you are taking this medicine. This medicine can cause serious allergic reactions. To reduce your risk you will need to take other medicine(s) before treatment with this medicine. If you experience allergic reactions like skin rash, itching or hives, swelling of the face, lips, or tongue, tell your doctor or health care professional right away. In some cases, you may be given additional medicines to help with side effects. Follow all directions for their use. This drug may make you feel generally unwell. This is not uncommon, as chemotherapy can affect healthy cells as well as cancer cells. Report any side effects. Continue your course of treatment even though you feel ill unless your doctor tells you to stop. Call your doctor or health care  professional for advice if you get a fever, chills or sore throat, or other symptoms of a cold or flu. Do not treat yourself. This drug decreases your body's ability to fight infections. Try to avoid being around people who are sick. This medicine may increase your risk to bruise or bleed. Call your doctor or health care professional if you notice any unusual bleeding. Be careful brushing and flossing your teeth or using a toothpick because you may get an infection or bleed more easily. If you have any dental work done, tell your dentist you are receiving this medicine. Avoid taking products that contain aspirin, acetaminophen, ibuprofen, naproxen,  or ketoprofen unless instructed by your doctor. These medicines may hide a fever. Do not become pregnant while taking this medicine. Women should inform their doctor if they wish to become pregnant or think they might be pregnant. There is a potential for serious side effects to an unborn child. Talk to your health care professional or pharmacist for more information. Do not breast-feed an infant while taking this medicine. Men are advised not to father a child while receiving this medicine. This product may contain alcohol. Ask your pharmacist or healthcare provider if this medicine contains alcohol. Be sure to tell all healthcare providers you are taking this medicine. Certain medicines, like metronidazole and disulfiram, can cause an unpleasant reaction when taken with alcohol. The reaction includes flushing, headache, nausea, vomiting, sweating, and increased thirst. The reaction can last from 30 minutes to several hours. What side effects may I notice from receiving this medication? Side effects that you should report to your doctor or health care professional as soon as possible: allergic reactions like skin rash, itching or hives, swelling of the face, lips, or tongue breathing problems changes in vision fast, irregular heartbeat high or low blood pressure mouth sores pain, tingling, numbness in the hands or feet signs of decreased platelets or bleeding - bruising, pinpoint red spots on the skin, black, tarry stools, blood in the urine signs of decreased red blood cells - unusually weak or tired, feeling faint or lightheaded, falls signs of infection - fever or chills, cough, sore throat, pain or difficulty passing urine signs and symptoms of liver injury like dark yellow or brown urine; general ill feeling or flu-like symptoms; light-colored stools; loss of appetite; nausea; right upper belly pain; unusually weak or tired; yellowing of the eyes or skin swelling of the ankles, feet,  hands unusually slow heartbeat Side effects that usually do not require medical attention (report to your doctor or health care professional if they continue or are bothersome): diarrhea hair loss loss of appetite muscle or joint pain nausea, vomiting pain, redness, or irritation at site where injected tiredness This list may not describe all possible side effects. Call your doctor for medical advice about side effects. You may report side effects to FDA at 1-800-FDA-1088. Where should I keep my medication? This drug is given in a hospital or clinic and will not be stored at home. NOTE: This sheet is a summary. It may not cover all possible information. If you have questions about this medicine, talk to your doctor, pharmacist, or health care provider.  2023 Elsevier/Gold Standard (2021-05-25 00:00:00)

## 2021-12-26 NOTE — Progress Notes (Signed)
DATE:  12/26/21                                        X CHEMO/IMMUNOTHERAPY REACTION            MD: Lindi Adie   AGENT/BLOOD PRODUCT RECEIVING TODAY:              Paclitaxel and trastuzumab-dkst   AGENT/BLOOD PRODUCT RECEIVING IMMEDIATELY PRIOR TO REACTION:          Paclitaxel   VS: BP:     118/59   P:       69       SPO2:       98% RA                BP:     113/65   P:       73       SPO2:       97% RA     REACTION(S):           facial flushing and back pain   PREMEDS:     benadryl 12.5 mg IV,  pepcid 20 mg IV, Tylenol 650 mg PO   INTERVENTION: Benadryl 12.5 mg   Review of Systems  Review of Systems  Musculoskeletal:  Positive for back pain.  Skin:  Positive for color change.  All other systems reviewed and are negative.    Physical Exam  Physical Exam Vitals and nursing note reviewed.  Constitutional:      Appearance: She is well-developed. She is not ill-appearing or toxic-appearing.  HENT:     Head: Normocephalic and atraumatic.     Nose: Nose normal.  Eyes:     General: No scleral icterus.       Right eye: No discharge.        Left eye: No discharge.     Conjunctiva/sclera: Conjunctivae normal.  Neck:     Vascular: No JVD.  Cardiovascular:     Rate and Rhythm: Normal rate and regular rhythm.     Pulses: Normal pulses.     Heart sounds: Normal heart sounds.  Pulmonary:     Effort: Pulmonary effort is normal. No respiratory distress.     Breath sounds: Normal breath sounds. No stridor. No wheezing, rhonchi or rales.  Chest:     Chest wall: No tenderness.  Abdominal:     General: There is no distension.  Musculoskeletal:        General: Normal range of motion.     Cervical back: Normal range of motion.     Comments: No tenderness to palpation of cervical, thoracic, lumbar spine. No overlying skin changes.  Skin:    General: Skin is warm and dry.     Findings: No erythema.  Neurological:     Mental Status: She is oriented to person, place, and time.      GCS: GCS eye subscore is 4. GCS verbal subscore is 5. GCS motor subscore is 6.     Comments: Fluent speech, no facial droop.  Psychiatric:        Behavior: Behavior normal.     OUTCOME:                Patient with sudden onset of thoracic back pain radiating down her spine and RN noticed facial flushing. Treatment paused. Patient's symptoms resolved. After discussion with oncologist plan is  to give another 12.5 mg of benadryl and hold off on steroids at this time. Patient tolerated remainder of treatment without adverse reactions.

## 2021-12-27 ENCOUNTER — Other Ambulatory Visit: Payer: Self-pay

## 2021-12-27 ENCOUNTER — Telehealth: Payer: Self-pay

## 2021-12-27 ENCOUNTER — Telehealth: Payer: Self-pay | Admitting: Genetic Counselor

## 2021-12-27 ENCOUNTER — Encounter (HOSPITAL_BASED_OUTPATIENT_CLINIC_OR_DEPARTMENT_OTHER): Payer: Self-pay | Admitting: Surgery

## 2021-12-27 NOTE — Telephone Encounter (Signed)
Ms Sydney Key states that she is doing fine. She is eating, drinking, and urinating well. She knows to call the office at [phone] if she has any questions or concerns. Ms Sydney Key inquired about receiving her B-12 injection monthly while on this chemotherapy. Told her that she can continue with her monthly injections. Pt verbalized understanding.

## 2021-12-27 NOTE — Telephone Encounter (Signed)
-----   Message from Wylene Men, RN sent at 12/26/2021  3:48 PM EDT ----- Regarding: [facility] Patient received 1st time trastuzumab/paclitaxel.  C/o mild back pain after starting paclitaxel.  Extra dose of benadryl 12.'5mg'$  given per Dr. Lindi Adie.  Tolerated rest of treatment with no other s/s or c/o distress or discomfort.

## 2021-12-31 ENCOUNTER — Ambulatory Visit: Payer: Self-pay | Admitting: Genetic Counselor

## 2021-12-31 DIAGNOSIS — Z1379 Encounter for other screening for genetic and chromosomal anomalies: Secondary | ICD-10-CM

## 2021-12-31 NOTE — Progress Notes (Signed)
HPI:   Ms. Bleak was previously seen in the [facility] due to a personal and family history of cancer and concerns regarding a hereditary predisposition to cancer. Please refer to our prior cancer genetics clinic note for more information regarding our discussion, assessment and recommendations, at the time. Ms. Francoeur recent genetic test results were disclosed to her, as were recommendations warranted by these results. These results and recommendations are discussed in more detail below.  CANCER HISTORY:  Oncology History  Malignant neoplasm of lower-inner quadrant of right breast of female, estrogen receptor negative (HCC)  11/28/2021 Initial Diagnosis   Palpable right breast lump 2.6 cm with calcifications at 3 o'clock position, additional 1.3 cm lesion is an intramammary lymph node biopsy: Benign, axilla negative, biopsy of the breast lump: Grade 3 IDC ER 0%, PR 0%, Ki-67 98%, HER2 3+ positive   12/05/2021 Cancer Staging   Staging form: Breast, AJCC 8th Edition - Clinical: Stage IIA (cT2, cN0, cM0, G3, ER-, PR-, HER2+) - Signed by Serena Croissant, MD on 12/05/2021 Stage prefix: Initial diagnosis Histologic grading system: 3 grade system   12/26/2021 -  Chemotherapy   Patient is on Treatment Plan : BREAST Paclitaxel + Trastuzumab q7d / Trastuzumab q21d      Genetic Testing   [facility] CustomNext Panel was Negative. Report date is 12/18/2021.  The CustomNext-Cancer+RNAinsight panel offered by [employer] includes sequencing and rearrangement analysis for the following 47 genes:  APC, ATM, AXIN2, BARD1, BMPR1A, BRCA1, BRCA2, BRIP1, CDH1, CDK4, CDKN2A, CHEK2, CTNNA1, DICER1, EPCAM, GREM1, HOXB13, KIT, MEN1, MLH1, MSH2, MSH3, MSH6, MUTYH, NBN, NF1, NTHL1, PALB2, PDGFRA, PMS2, POLD1, POLE, PTEN, RAD50, RAD51C, RAD51D, SDHA, SDHB, SDHC, SDHD, SMAD4, SMARCA4, STK11, TP53, TSC1, TSC2, and VHL.  RNA data is routinely analyzed for use in variant interpretation for all genes.      FAMILY HISTORY:  We obtained a detailed, 4-generation family history.  Significant diagnoses are listed below:      Ms. Oelke daughter was diagnosed with breast cancer at age 79 and reportedly had negative genetic testing about 5 years ago. Her sister was diagnosed with breast cancer at age 34, she died in her 39s. Ms. Bogacz has a niece who was diagnosed with breast cancer at age 106 and a second niece who was diagnosed with breast cancer at age 20 (the two nieces are not siblings). Her mother had cancer on her neck diagnosed at age 65, she died at 59. Ms. Goerke's father was diagnosed with colon cancer at age 24, he died at 84. There is no reported Ashkenazi Jewish ancestry.   GENETIC TEST RESULTS:  The [facility] CustomNext Panel found no pathogenic mutations.  The CustomNext-Cancer+RNAinsight panel offered by [employer] [facility] includes sequencing and rearrangement analysis for the following 47 genes:  APC, ATM, AXIN2, BARD1, BMPR1A, BRCA1, BRCA2, BRIP1, CDH1, CDK4, CDKN2A, CHEK2, CTNNA1, DICER1, EPCAM, GREM1, HOXB13, KIT, MEN1, MLH1, MSH2, MSH3, MSH6, MUTYH, NBN, NF1, NTHL1, PALB2, PDGFRA, PMS2, POLD1, POLE, PTEN, RAD50, RAD51C, RAD51D, SDHA, SDHB, SDHC, SDHD, SMAD4, SMARCA4, STK11, TP53, TSC1, TSC2, and VHL.  RNA data is routinely analyzed for use in variant interpretation for all genes.  The test report has been scanned into EPIC and is located under the Molecular Pathology section of the Results Review tab.  A portion of the result report is included below for reference. Genetic testing reported out on 12/18/2021.       Even though a pathogenic variant was not identified, possible explanations for the cancer in the  family may include: There may be no hereditary risk for cancer in the family. The cancers in Ms. Buchanan and/or her family may be due to other genetic or environmental factors. There may be a gene mutation in one of these genes that current testing methods cannot detect, but  that chance is small. There could be another gene that has not yet been discovered, or that we have not yet tested, that is responsible for the cancer diagnoses in the family.  It is also possible there is a hereditary cause for the cancer in the family that Ms. Umanzor did not inherit.  Therefore, it is important to remain in touch with cancer genetics in the future so that we can continue to offer Ms. Casebier the most up to date genetic testing.   ADDITIONAL GENETIC TESTING:  We discussed with Ms. Piltz that her genetic testing was fairly extensive.  If there are genes identified to increase cancer risk that can be analyzed in the future, we would be happy to discuss and coordinate this testing at that time.    CANCER SCREENING RECOMMENDATIONS:  Ms. Tester's test result is considered negative (normal).  This means that we have not identified a hereditary cause for her personal and family history of cancer at this time.    An individual's cancer risk and medical management are not determined by genetic test results alone. Overall cancer risk assessment incorporates additional factors, including personal medical history, family history, and any available genetic information that may result in a personalized plan for cancer prevention and surveillance. Therefore, it is recommended she continue to follow the cancer management and screening guidelines provided by her oncology and primary healthcare provider.  RECOMMENDATIONS FOR FAMILY MEMBERS:   Since she did not inherit a mutation in a cancer predisposition gene included on this panel, her children could not have inherited a mutation from her in one of these genes. Individuals in this family might be at some increased risk of developing cancer, over the general population risk, due to the family history of cancer. We recommend women in this family have a yearly mammogram beginning at age 55, or 56 years younger than the earliest onset of cancer, an  annual clinical breast exam, and perform monthly breast self-exams. Other members of the family may still carry a pathogenic variant in one of these genes that Ms. Kendrick did not inherit. Based on the family history, we recommend her nieces with a history of breast cancer have genetic counseling and testing.   FOLLOW-UP:  Cancer genetics is a rapidly advancing field and it is possible that new genetic tests will be appropriate for her and/or her family members in the future. We encouraged her to remain in contact with cancer genetics on an annual basis so we can update her personal and family histories and let her know of advances in cancer genetics that may benefit this family.   Our contact number was provided. Ms. Cordell questions were answered to her satisfaction, and she knows she is welcome to call us at anytime with additional questions or concerns.   Lalla Brothers, MS, [facility] Genetic Counselor [email or web address] (P) [phone]

## 2022-01-01 ENCOUNTER — Other Ambulatory Visit: Payer: Self-pay

## 2022-01-01 ENCOUNTER — Inpatient Hospital Stay: Payer: Medicare Other

## 2022-01-01 ENCOUNTER — Other Ambulatory Visit: Payer: Medicare Other

## 2022-01-01 ENCOUNTER — Inpatient Hospital Stay (HOSPITAL_BASED_OUTPATIENT_CLINIC_OR_DEPARTMENT_OTHER): Payer: Medicare Other | Admitting: Hematology and Oncology

## 2022-01-01 VITALS — BP 131/69 | HR 72 | Resp 17

## 2022-01-01 DIAGNOSIS — Z5111 Encounter for antineoplastic chemotherapy: Secondary | ICD-10-CM | POA: Diagnosis not present

## 2022-01-01 DIAGNOSIS — Z171 Estrogen receptor negative status [ER-]: Secondary | ICD-10-CM

## 2022-01-01 DIAGNOSIS — Z5112 Encounter for antineoplastic immunotherapy: Secondary | ICD-10-CM | POA: Diagnosis not present

## 2022-01-01 DIAGNOSIS — C50311 Malignant neoplasm of lower-inner quadrant of right female breast: Secondary | ICD-10-CM | POA: Diagnosis not present

## 2022-01-01 DIAGNOSIS — Z79899 Other long term (current) drug therapy: Secondary | ICD-10-CM | POA: Diagnosis not present

## 2022-01-01 DIAGNOSIS — Z7982 Long term (current) use of aspirin: Secondary | ICD-10-CM | POA: Diagnosis not present

## 2022-01-01 LAB — CBC WITH DIFFERENTIAL/PLATELET
Abs Immature Granulocytes: 0.03 10*3/uL (ref 0.00–0.07)
Basophils Absolute: 0 10*3/uL (ref 0.0–0.1)
Basophils Relative: 1 %
Eosinophils Absolute: 0.1 10*3/uL (ref 0.0–0.5)
Eosinophils Relative: 2 %
HCT: 35.3 % — ABNORMAL LOW (ref 36.0–46.0)
Hemoglobin: 11.8 g/dL — ABNORMAL LOW (ref 12.0–15.0)
Immature Granulocytes: 1 %
Lymphocytes Relative: 30 %
Lymphs Abs: 1.1 10*3/uL (ref 0.7–4.0)
MCH: 31.7 pg (ref 26.0–34.0)
MCHC: 33.4 g/dL (ref 30.0–36.0)
MCV: 94.9 fL (ref 80.0–100.0)
Monocytes Absolute: 0.2 10*3/uL (ref 0.1–1.0)
Monocytes Relative: 6 %
Neutro Abs: 2.3 10*3/uL (ref 1.7–7.7)
Neutrophils Relative %: 60 %
Platelets: 184 10*3/uL (ref 150–400)
RBC: 3.72 MIL/uL — ABNORMAL LOW (ref 3.87–5.11)
RDW: 12.4 % (ref 11.5–15.5)
WBC: 3.8 10*3/uL — ABNORMAL LOW (ref 4.0–10.5)
nRBC: 0 % (ref 0.0–0.2)

## 2022-01-01 LAB — COMPREHENSIVE METABOLIC PANEL
ALT: 19 U/L (ref 0–44)
AST: 17 U/L (ref 15–41)
Albumin: 3.9 g/dL (ref 3.5–5.0)
Alkaline Phosphatase: 60 U/L (ref 38–126)
Anion gap: 5 (ref 5–15)
BUN: 15 mg/dL (ref 8–23)
CO2: 25 mmol/L (ref 22–32)
Calcium: 9.2 mg/dL (ref 8.9–10.3)
Chloride: 108 mmol/L (ref 98–111)
Creatinine, Ser: 0.64 mg/dL (ref 0.44–1.00)
GFR, Estimated: >60 mL/min (ref >60)
Glucose, Bld: 103 mg/dL — ABNORMAL HIGH (ref 70–99)
Potassium: 4.2 mmol/L (ref 3.5–5.1)
Sodium: 138 mmol/L (ref 135–145)
Total Bilirubin: 0.9 mg/dL (ref 0.3–1.2)
Total Protein: 6.5 g/dL (ref 6.5–8.1)

## 2022-01-01 MED ORDER — SODIUM CHLORIDE 0.9 % IV SOLN
50.0000 mg/m2 | Freq: Once | INTRAVENOUS | Status: AC
  Administered 2022-01-01: 84 mg via INTRAVENOUS
  Filled 2022-01-01: qty 14

## 2022-01-01 MED ORDER — TRASTUZUMAB-DKST CHEMO 150 MG IV SOLR
2.0000 mg/kg | Freq: Once | INTRAVENOUS | Status: AC
  Administered 2022-01-01: 126 mg via INTRAVENOUS
  Filled 2022-01-01: qty 6

## 2022-01-01 MED ORDER — DIPHENHYDRAMINE HCL 50 MG/ML IJ SOLN
12.5000 mg | Freq: Once | INTRAMUSCULAR | Status: AC
  Administered 2022-01-01: 12.5 mg via INTRAVENOUS
  Filled 2022-01-01: qty 1

## 2022-01-01 MED ORDER — SODIUM CHLORIDE 0.9 % IV SOLN: Freq: Once | INTRAVENOUS | Status: AC

## 2022-01-01 MED ORDER — FAMOTIDINE IN NACL 20-0.9 MG/50ML-% IV SOLN
20.0000 mg | Freq: Once | INTRAVENOUS | Status: AC
  Administered 2022-01-01: 20 mg via INTRAVENOUS
  Filled 2022-01-01: qty 50

## 2022-01-01 MED ORDER — ACETAMINOPHEN 325 MG PO TABS
650.0000 mg | ORAL_TABLET | Freq: Once | ORAL | Status: AC
  Administered 2022-01-01: 650 mg via ORAL
  Filled 2022-01-01: qty 2

## 2022-01-01 MED ORDER — SODIUM CHLORIDE 0.9 % IV SOLN
10.0000 mg | Freq: Once | INTRAVENOUS | Status: AC
  Administered 2022-01-01: 10 mg via INTRAVENOUS
  Filled 2022-01-01: qty 10

## 2022-01-03 ENCOUNTER — Encounter: Payer: Self-pay | Admitting: Genetic Counselor

## 2022-01-04 ENCOUNTER — Encounter: Payer: Self-pay | Admitting: Hematology and Oncology

## 2022-01-04 ENCOUNTER — Other Ambulatory Visit: Payer: Self-pay

## 2022-01-04 ENCOUNTER — Encounter (HOSPITAL_BASED_OUTPATIENT_CLINIC_OR_DEPARTMENT_OTHER)
Admission: RE | Disposition: A | Discharge status: Home / Self Care | Payer: Self-pay | Source: Home / Self Care | Attending: Surgery

## 2022-01-04 ENCOUNTER — Ambulatory Visit (HOSPITAL_COMMUNITY): Payer: Medicare Other

## 2022-01-04 ENCOUNTER — Ambulatory Visit (HOSPITAL_BASED_OUTPATIENT_CLINIC_OR_DEPARTMENT_OTHER): Payer: Medicare Other | Admitting: Anesthesiology

## 2022-01-04 ENCOUNTER — Ambulatory Visit (HOSPITAL_BASED_OUTPATIENT_CLINIC_OR_DEPARTMENT_OTHER)
Admission: RE | Admit: 2022-01-04 | Discharge: 2022-01-04 | Disposition: A | Discharge status: Home / Self Care | Payer: Medicare Other | Attending: Surgery | Admitting: Surgery

## 2022-01-04 ENCOUNTER — Encounter (HOSPITAL_BASED_OUTPATIENT_CLINIC_OR_DEPARTMENT_OTHER): Payer: Self-pay | Admitting: Surgery

## 2022-01-04 DIAGNOSIS — F419 Anxiety disorder, unspecified: Secondary | ICD-10-CM | POA: Insufficient documentation

## 2022-01-04 DIAGNOSIS — Z452 Encounter for adjustment and management of vascular access device: Secondary | ICD-10-CM | POA: Diagnosis not present

## 2022-01-04 DIAGNOSIS — Z171 Estrogen receptor negative status [ER-]: Secondary | ICD-10-CM | POA: Diagnosis not present

## 2022-01-04 DIAGNOSIS — Z803 Family history of malignant neoplasm of breast: Secondary | ICD-10-CM | POA: Insufficient documentation

## 2022-01-04 DIAGNOSIS — C50311 Malignant neoplasm of lower-inner quadrant of right female breast: Secondary | ICD-10-CM | POA: Diagnosis not present

## 2022-01-04 DIAGNOSIS — Z5111 Encounter for antineoplastic chemotherapy: Secondary | ICD-10-CM

## 2022-01-04 DIAGNOSIS — J9811 Atelectasis: Secondary | ICD-10-CM | POA: Diagnosis not present

## 2022-01-04 HISTORY — PX: PORTACATH PLACEMENT: SHX2246

## 2022-01-04 HISTORY — DX: Personal history of urinary calculi: Z87.442

## 2022-01-04 SURGERY — INSERTION, TUNNELED CENTRAL VENOUS DEVICE, WITH PORT
Anesthesia: General | Site: Chest | Laterality: Right

## 2022-01-04 MED ORDER — CEFAZOLIN SODIUM-DEXTROSE 2-4 GM/100ML-% IV SOLN: 2.0000 g | INTRAVENOUS | Status: DC

## 2022-01-04 MED ORDER — ONDANSETRON HCL 4 MG/2ML IJ SOLN
INTRAMUSCULAR | Status: AC
  Filled 2022-01-04: qty 2

## 2022-01-04 MED ORDER — FENTANYL CITRATE (PF) 100 MCG/2ML IJ SOLN
INTRAMUSCULAR | Status: AC
  Filled 2022-01-04: qty 2

## 2022-01-04 MED ORDER — HEPARIN SOD (PORK) LOCK FLUSH 100 UNIT/ML IV SOLN
INTRAVENOUS | Status: AC
  Filled 2022-01-04: qty 5

## 2022-01-04 MED ORDER — ACETAMINOPHEN 500 MG PO TABS
ORAL_TABLET | ORAL | Status: AC
Start: 2022-01-04 — End: ?
  Filled 2022-01-04: qty 2

## 2022-01-04 MED ORDER — DEXAMETHASONE SODIUM PHOSPHATE 4 MG/ML IJ SOLN
INTRAMUSCULAR | Status: DC | PRN
  Administered 2022-01-04: 4 mg via INTRAVENOUS

## 2022-01-04 MED ORDER — CEFAZOLIN SODIUM-DEXTROSE 2-4 GM/100ML-% IV SOLN
INTRAVENOUS | Status: AC
  Filled 2022-01-04: qty 100

## 2022-01-04 MED ORDER — DEXAMETHASONE SODIUM PHOSPHATE 10 MG/ML IJ SOLN
INTRAMUSCULAR | Status: AC
Start: 2022-01-04 — End: ?
  Filled 2022-01-04: qty 1

## 2022-01-04 MED ORDER — OXYCODONE HCL 5 MG PO TABS: 5.0000 mg | ORAL_TABLET | Freq: Four times a day (QID) | ORAL | 0 refills | Status: DC | PRN

## 2022-01-04 MED ORDER — HEPARIN 6000 UNIT IRRIGATION SOLUTION
Status: DC | PRN
  Administered 2022-01-04: 1

## 2022-01-04 MED ORDER — FENTANYL CITRATE (PF) 100 MCG/2ML IJ SOLN: 25.0000 ug | INTRAMUSCULAR | Status: DC | PRN

## 2022-01-04 MED ORDER — CHLORHEXIDINE GLUCONATE CLOTH 2 % EX PADS: 6.0000 | MEDICATED_PAD | Freq: Once | CUTANEOUS | Status: DC

## 2022-01-04 MED ORDER — OXYCODONE HCL 5 MG/5ML PO SOLN: 5.0000 mg | Freq: Once | ORAL | Status: DC | PRN

## 2022-01-04 MED ORDER — BUPIVACAINE-EPINEPHRINE (PF) 0.25% -1:200000 IJ SOLN
INTRAMUSCULAR | Status: AC
  Filled 2022-01-04: qty 30

## 2022-01-04 MED ORDER — LACTATED RINGERS IV SOLN: INTRAVENOUS | Status: DC

## 2022-01-04 MED ORDER — HEPARIN SOD (PORK) LOCK FLUSH 100 UNIT/ML IV SOLN
INTRAVENOUS | Status: DC | PRN
  Administered 2022-01-04: 500 [IU] via INTRAVENOUS

## 2022-01-04 MED ORDER — LIDOCAINE HCL (CARDIAC) PF 100 MG/5ML IV SOSY
PREFILLED_SYRINGE | INTRAVENOUS | Status: DC | PRN
  Administered 2022-01-04: 60 mg via INTRAVENOUS

## 2022-01-04 MED ORDER — ONDANSETRON HCL 4 MG/2ML IJ SOLN
INTRAMUSCULAR | Status: DC | PRN
  Administered 2022-01-04: 4 mg via INTRAVENOUS

## 2022-01-04 MED ORDER — OXYCODONE HCL 5 MG PO TABS: 5.0000 mg | ORAL_TABLET | Freq: Once | ORAL | Status: DC | PRN

## 2022-01-04 MED ORDER — ONDANSETRON HCL 4 MG/2ML IJ SOLN: 4.0000 mg | Freq: Once | INTRAMUSCULAR | Status: DC | PRN

## 2022-01-04 MED ORDER — BUPIVACAINE HCL (PF) 0.25 % IJ SOLN
INTRAMUSCULAR | Status: AC
  Filled 2022-01-04: qty 30

## 2022-01-04 MED ORDER — BUPIVACAINE-EPINEPHRINE 0.25% -1:200000 IJ SOLN
INTRAMUSCULAR | Status: DC | PRN
  Administered 2022-01-04: 10 mL

## 2022-01-04 MED ORDER — PHENYLEPHRINE HCL (PRESSORS) 10 MG/ML IV SOLN
INTRAVENOUS | Status: DC | PRN
  Administered 2022-01-04 (×4): 80 ug via INTRAVENOUS

## 2022-01-04 MED ORDER — FENTANYL CITRATE (PF) 100 MCG/2ML IJ SOLN
INTRAMUSCULAR | Status: DC | PRN
  Administered 2022-01-04 (×2): 25 ug via INTRAVENOUS

## 2022-01-04 MED ORDER — PROPOFOL 10 MG/ML IV BOLUS
INTRAVENOUS | Status: DC | PRN
  Administered 2022-01-04: 100 mg via INTRAVENOUS
  Administered 2022-01-04: 50 mg via INTRAVENOUS

## 2022-01-04 MED ORDER — AMISULPRIDE (ANTIEMETIC) 5 MG/2ML IV SOLN
10.0000 mg | Freq: Once | INTRAVENOUS | Status: DC | PRN
Start: 2022-01-04 — End: 2022-01-04

## 2022-01-04 MED ORDER — ACETAMINOPHEN 500 MG PO TABS
1000.0000 mg | ORAL_TABLET | Freq: Once | ORAL | Status: AC
  Administered 2022-01-04: 1000 mg via ORAL

## 2022-01-04 MED ORDER — HEPARIN (PORCINE) IN NACL 1000-0.9 UT/500ML-% IV SOLN
INTRAVENOUS | Status: AC
  Filled 2022-01-04: qty 500

## 2022-01-04 MED ORDER — EPHEDRINE SULFATE (PRESSORS) 50 MG/ML IJ SOLN
INTRAMUSCULAR | Status: DC | PRN
  Administered 2022-01-04 (×2): 10 mg via INTRAVENOUS

## 2022-01-04 SURGICAL SUPPLY — 37 items
BAG COUNTER SPONGE SURGICOUNT (BAG) ×2 IMPLANT
BAG DECANTER FOR FLEXI CONT (MISCELLANEOUS) ×2 IMPLANT
CHLORAPREP W/TINT 26 (MISCELLANEOUS) ×2 IMPLANT
COVER SURGICAL LIGHT HANDLE (MISCELLANEOUS) ×2 IMPLANT
COVER TRANSDUCER ULTRASND GEL (DISPOSABLE) ×2 IMPLANT
DERMABOND ADVANCED (GAUZE/BANDAGES/DRESSINGS) ×1
DERMABOND ADVANCED .7 DNX12 (GAUZE/BANDAGES/DRESSINGS) ×1 IMPLANT
DRAPE C-ARM 42X120 X-RAY (DRAPES) ×2 IMPLANT
ELECT CAUTERY BLADE 6.4 (BLADE) ×2 IMPLANT
ELECT REM PT RETURN 9FT ADLT (ELECTROSURGICAL) ×2
ELECTRODE REM PT RTRN 9FT ADLT (ELECTROSURGICAL) ×1 IMPLANT
GAUZE 4X4 16PLY ~~LOC~~+RFID DBL (SPONGE) ×2 IMPLANT
GEL ULTRASOUND 20GR AQUASONIC (MISCELLANEOUS) IMPLANT
GLOVE BIO SURGEON STRL SZ8 (GLOVE) ×2 IMPLANT
GLOVE BIOGEL PI IND STRL 8 (GLOVE) ×1 IMPLANT
GLOVE BIOGEL PI INDICATOR 8 (GLOVE) ×1
GOWN STRL REUS W/ TWL LRG LVL3 (GOWN DISPOSABLE) ×1 IMPLANT
GOWN STRL REUS W/ TWL XL LVL3 (GOWN DISPOSABLE) ×1 IMPLANT
GOWN STRL REUS W/TWL LRG LVL3 (GOWN DISPOSABLE) ×2
GOWN STRL REUS W/TWL XL LVL3 (GOWN DISPOSABLE) ×2
HEMOSTAT SNOW SURGICEL 2X4 (HEMOSTASIS) ×1 IMPLANT
INTRODUCER COOK 11FR (CATHETERS) IMPLANT
KIT BASIN OR (CUSTOM PROCEDURE TRAY) ×2 IMPLANT
KIT PORT POWER 8FR ISP CVUE (Port) ×1 IMPLANT
KIT TURNOVER KIT B (KITS) ×2 IMPLANT
NS IRRIG 1000ML POUR BTL (IV SOLUTION) ×2 IMPLANT
PAD ARMBOARD 7.5X6 YLW CONV (MISCELLANEOUS) ×2 IMPLANT
PENCIL BUTTON HOLSTER BLD 10FT (ELECTRODE) ×2 IMPLANT
POSITIONER HEAD DONUT 9IN (MISCELLANEOUS) ×2 IMPLANT
SET SHEATH INTRODUCER 10FR (MISCELLANEOUS) IMPLANT
SHEATH COOK PEEL AWAY SET 9F (SHEATH) IMPLANT
SUT MNCRL AB 4-0 PS2 18 (SUTURE) ×2 IMPLANT
SUT VIC AB 3-0 SH 27 (SUTURE) ×2
SUT VIC AB 3-0 SH 27X BRD (SUTURE) ×1 IMPLANT
SYR 5ML LUER SLIP (SYRINGE) ×2 IMPLANT
TOWEL GREEN STERILE (TOWEL DISPOSABLE) ×2 IMPLANT
TOWEL GREEN STERILE FF (TOWEL DISPOSABLE) ×2 IMPLANT

## 2022-01-04 NOTE — Transfer of Care (Signed)
Immediate Anesthesia Transfer of Care Note  Patient: Sydney Key  Procedure(s) Performed: PORT PLACEMENT WITH ULTRASOUND GUIDANCE (Right: Chest)  Patient Location: PACU  Anesthesia Type:General  Level of Consciousness: sedated  Airway & Oxygen Therapy: Patient Spontanous Breathing and Patient connected to face mask oxygen  Post-op Assessment: Report given to RN and Post -op Vital signs reviewed and stable  Post vital signs: Reviewed and stable  Last Vitals:  Vitals Value Taken Time  BP 115/62 01/04/22 1402  Temp    Pulse 69 01/04/22 1404  Resp 12 01/04/22 1404  SpO2 97 % 01/04/22 1404  Vitals shown include unvalidated device data.  Last Pain:  Vitals:   01/04/22 1136  TempSrc: Oral  PainSc: 0-No pain         Complications: No notable events documented.

## 2022-01-04 NOTE — Anesthesia Postprocedure Evaluation (Signed)
Anesthesia Post Note  Patient: Sydney Key  Procedure(s) Performed: PORT PLACEMENT WITH ULTRASOUND GUIDANCE (Right: Chest)     Patient location during evaluation: PACU Anesthesia Type: General Level of consciousness: awake and alert Pain management: pain level controlled Vital Signs Assessment: post-procedure vital signs reviewed and stable Respiratory status: spontaneous breathing, nonlabored ventilation, respiratory function stable and patient connected to nasal cannula oxygen Cardiovascular status: blood pressure returned to baseline and stable Postop Assessment: no apparent nausea or vomiting Anesthetic complications: no   No notable events documented.  Last Vitals:  Vitals:   01/04/22 1430 01/04/22 1500  BP: 137/76 138/69  Pulse: 69 72  Resp: 14 16  Temp:  36.4 C  SpO2: 100% 96%    Last Pain:  Vitals:   01/04/22 1500  TempSrc:   PainSc: 0-No pain                 Kenia Teagarden S

## 2022-01-04 NOTE — Op Note (Signed)
Preoperative diagnosis: PAC needed FOR CHEMOTHERAPY   Postoperative diagnosis: Same  Procedure: Portacath Placement with C arm and US guidance   Surgeon: Turner Daniels, MD, FACS  Anesthesia: General and 0.25 % marcaine with epinephrine  Clinical History and Indications: The patient is getting ready to begin chemotherapy for her cancer. She  needs a Port-A-Cath for venous access. Risk of bleeding, infection,  Collapse lung,  Death,  DVT,  Organ injury,  Mediastinal injury,  Injury to heart,  Injury to blood vessels,  Nerves,  Migration of catheter,  Embolization of catheter and the need for more surgery.  Description of Procedure: I have seen the patient in the holding area and confirmed the plans for the procedure as noted above. I reviewed the risks and complications again and the patient has no further questions. She wishes to proceed.   The patient was then taken to the operating room. After satisfactory general  anesthesia had been obtained the upper chest and lower neck were prepped and draped as a sterile field. The timeout was done.  The right internal jugular vein  was entered under U/S guidance  and the guidewire threaded into the superior vena cava right atrial area under fluoroscopic guidance. An incision was then made on the anterior chest wall and a subcutaneous pocket fashioned for the port reservoir.  The port tubing was then brought through a subcutaneous tunnel from the port site to the guidewire site.  The port and catheter were attached, locked  and flushed. The catheter was measured and cut to appropriate length.The dilator and peel-away sheath were then advanced over the guidewire while monitoring this with fluoroscopy. The guidewire and dilator were removed and the tubing threaded to approximately 21 cm. The peel-away sheath was then removed. The catheter aspirated and flushed easily. Using fluoroscopy the tip was in the superior vena cava right atrial junction area. It  aspirated and flushed easily. That aspirated and flushed easily.  The reservoir was secured to the fascia with 1 sutures of 2-0 Prolene. A final check with fluoroscopy was done to make sure we had no kinks and good positioning of the tip of the catheter. Everything appeared to be okay. The catheter was aspirated, flushed with dilute heparin and then concentrated aqueous heparin.  The incision was then closed with interrupted 3-0 Vicryl, and 4-0 Monocryl subcuticular with Dermabond on the skin.  There were no operative complications. Estimated blood loss was minimal. All counts were correct. The patient tolerated the procedure well.  Turner Daniels, MD, FACS

## 2022-01-04 NOTE — Anesthesia Preprocedure Evaluation (Signed)
Anesthesia Evaluation  Patient identified by MRN, date of birth, ID band Patient awake    Reviewed: Allergy & Precautions, H&P , NPO status , Patient's Chart, lab work & pertinent test results  Airway Mallampati: II   Neck ROM: full    Dental   Pulmonary neg pulmonary ROS,    breath sounds clear to auscultation       Cardiovascular negative cardio ROS   Rhythm:regular Rate:Normal     Neuro/Psych PSYCHIATRIC DISORDERS Anxiety    GI/Hepatic   Endo/Other    Renal/GU Renal diseasestones     Musculoskeletal   Abdominal   Peds  Hematology   Anesthesia Other Findings   Reproductive/Obstetrics                             Anesthesia Physical Anesthesia Plan  ASA: 2  Anesthesia Plan: General   Post-op Pain Management:    Induction: Intravenous  PONV Risk Score and Plan: 3 and Ondansetron, Dexamethasone and Treatment may vary due to age or medical condition  Airway Management Planned: LMA  Additional Equipment:   Intra-op Plan:   Post-operative Plan: Extubation in OR  Informed Consent: I have reviewed the patients History and Physical, chart, labs and discussed the procedure including the risks, benefits and alternatives for the proposed anesthesia with the patient or authorized representative who has indicated his/her understanding and acceptance.     Dental advisory given  Plan Discussed with: CRNA, Anesthesiologist and Surgeon  Anesthesia Plan Comments:         Anesthesia Quick Evaluation

## 2022-01-04 NOTE — Anesthesia Procedure Notes (Signed)
Procedure Name: LMA Insertion Date/Time: 01/04/2022 12:55 PM  Performed by: Maryella Shivers, CRNAPre-anesthesia Checklist: Patient identified, Emergency Drugs available, Suction available and Patient being monitored Patient Re-evaluated:Patient Re-evaluated prior to induction Oxygen Delivery Method: Circle system utilized Preoxygenation: Pre-oxygenation with 100% oxygen Induction Type: IV induction Ventilation: Mask ventilation without difficulty LMA: LMA inserted LMA Size: 4.0 Number of attempts: 1 Airway Equipment and Method: Bite block Placement Confirmation: positive ETCO2 Tube secured with: Tape Dental Injury: Teeth and Oropharynx as per pre-operative assessment

## 2022-01-04 NOTE — Progress Notes (Signed)
Patient left voicemail regarding bills.  Returned call to patient and left voicemail with my contact name and number.

## 2022-01-04 NOTE — Discharge Instructions (Addendum)
PORT-A-CATH: POST OP INSTRUCTIONS  Always review your discharge instruction sheet given to you by the facility where your surgery was performed.   A prescription for pain medication may be given to you upon discharge. Take your pain medication as prescribed, if needed. If narcotic pain medicine is not needed, then you make take acetaminophen (Tylenol) or ibuprofen (Advil) as needed.  Take your usually prescribed medications unless otherwise directed. If you need a refill on your pain medication, please contact our office. All narcotic pain medicine now requires a paper prescription.  Phoned in and fax refills are no longer allowed by law.  Prescriptions will not be filled after 5 pm or on weekends.  You should follow a light diet for the remainder of the day after your procedure. Most patients will experience some mild swelling and/or bruising in the area of the incision. It may take several days to resolve. It is common to experience some constipation if taking pain medication after surgery. Increasing fluid intake and taking a stool softener (such as Colace) will usually help or prevent this problem from occurring. A mild laxative (Milk of Magnesia or Miralax) should be taken according to package directions if there are no bowel movements after 48 hours.  Unless discharge instructions indicate otherwise, you may remove your bandages 48 hours after surgery, and you may shower at that time. You may have steri-strips (small white skin tapes) in place directly over the incision.  These strips should be left on the skin for 7-10 days.  If your surgeon used Dermabond (skin glue) on the incision, you may shower in 24 hours.  The glue will flake off over the next 2-3 weeks.  If your port is left accessed at the end of surgery (needle left in port), the dressing cannot get wet and should only by changed by a healthcare professional. When the port is no longer accessed (when the needle has been removed),  follow step 7.   ACTIVITIES:  Limit activity involving your arms for the next 72 hours. Do no strenuous exercise or activity for 1 week. You may drive when you are no longer taking prescription pain medication, you can comfortably wear a seatbelt, and you can maneuver your car. 10.You may need to see your doctor in the office for a follow-up appointment.  Please       check with your doctor.  11.When you receive a new Port-a-Cath, you will get a product guide and        ID card.  Please keep them in case you need them.  WHEN TO CALL YOUR DOCTOR [phone]): Fever over 101.0 Chills Continued bleeding from incision Increased redness and tenderness at the site Shortness of breath, difficulty breathing   The clinic staff is available to answer your questions during regular business hours. Please don't hesitate to call and ask to speak to one of the nurses or medical assistants for clinical concerns. If you have a medical emergency, go to the nearest emergency room or call 911.  A surgeon from [facility] is always on call at the hospital.     For further information, please visit [email or web address]   Post Anesthesia Home Care Instructions  Activity: Get plenty of rest for the remainder of the day. A responsible individual must stay with you for 24 hours following the procedure.  For the next 24 hours, DO NOT: -Drive a car -Paediatric nurse -Drink alcoholic beverages -Take any medication unless instructed by your physician -Make  any legal decisions or sign important papers.  Meals: Start with liquid foods such as gelatin or soup. Progress to regular foods as tolerated. Avoid greasy, spicy, heavy foods. If nausea and/or vomiting occur, drink only clear liquids until the nausea and/or vomiting subsides. Call your physician if vomiting continues.  Special Instructions/Symptoms: Your throat may feel dry or sore from the anesthesia or the breathing tube placed  in your throat during surgery. If this causes discomfort, gargle with warm salt water. The discomfort should disappear within 24 hours.  If you had a scopolamine patch placed behind your ear for the management of post- operative nausea and/or vomiting:  1. The medication in the patch is effective for 72 hours, after which it should be removed.  Wrap patch in a tissue and discard in the trash. Wash hands thoroughly with soap and water. 2. You may remove the patch earlier than 72 hours if you experience unpleasant side effects which may include dry mouth, dizziness or visual disturbances. 3. Avoid touching the patch. Wash your hands with soap and water after contact with the patch.

## 2022-01-04 NOTE — Progress Notes (Signed)
Patient returned my call.  She states she was given an amount to pay today and wasn't sure what it was for. Advised patient to check with check-in staff to clarify any money being paid and what it covers. She states she thought she would check with me since she has the grant. Advised the grant only covers personal household bills and gas cards. She verbalized understanding.  Called patient back to see where her appointment is today because I did not see any appointments for her today at the [facility]. She states she was having her port placed today. Advised her that may have been where the amount due she was given was for. Advised her to check when she checks in.  She has my card for any additional financial questions or concerns.

## 2022-01-04 NOTE — Interval H&P Note (Signed)
History and Physical Interval Note:  01/04/2022 12:33 PM  Sydney Key  has presented today for surgery, with the diagnosis of poor venous access/ chemotherapy.  The various methods of treatment have been discussed with the patient and family. After consideration of risks, benefits and other options for treatment, the patient has consented to  Procedure(s): PORT PLACEMENT WITH ULTRASOUND GUIDANCE (N/A) as a surgical intervention.  The patient's history has been reviewed, patient examined, no change in status, stable for surgery.  I have reviewed the patient's chart and labs.  Questions were answered to the patient's satisfaction.     [place]

## 2022-01-04 NOTE — H&P (Signed)
History of Present Illness: Sydney Key is a 88 y.o. female who is seen today as an office consultation for evaluation of Breast Cancer .   Patient seen today in the [facility] for right breast mass x1 month. Patient noted a mass in her right breast. Her last mammogram was 2 and half years ago. She does have strong family history of breast cancer with her daughter as well as nieces with breast cancer. The work-up revealed a mammogram ultrasound a 2.6 cm mass right breast upper inner quadrant overlapping with the lower inner quadrant. 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 Lymph nodes appear grossly normal. She is here today to discuss options. No other complaint. She gets around well for her age. She is able to ambulate. She lives independently. She can take care of her self and her individual needs.  Review of Systems: A complete review of systems was obtained from the patient. I have reviewed this information and discussed as appropriate with the patient. See HPI as well for other ROS.    Medical History: Past Medical History:  Diagnosis Date   Anxiety   History of cancer   Patient Active Problem List  Diagnosis   Malignant neoplasm of lower-inner quadrant of right female breast (CMS-HCC)   Past Surgical History:  Procedure Laterality Date   HYSTERECTOMY    No Known Allergies  Current Outpatient Medications on File Prior to Visit  Medication Sig Dispense Refill   acetaminophen (TYLENOL) 500 MG tablet Take by mouth every 8 (eight) hours as needed   albuterol 90 mcg/actuation inhaler Inhale into the lungs every 4 (four) hours as needed   alendronate (FOSAMAX) 70 MG tablet Take by mouth   brimonidine (ALPHAGAN P) 0.15 % ophthalmic solution Administer 1 drop to both eyes two (2) times a day.   cyanocobalamin (VITAMIN B12) 1,000 mcg/mL injection Inject into the muscle   ascorbic acid, vitamin Key, (VITAMIN Key) 500 MG tablet Take 500 mg  by mouth daily.   aspirin 81 MG EC tablet Take 81 mg by mouth once daily   cholecalciferol (VITAMIN D3) 1000 unit tablet Take by mouth   escitalopram oxalate (LEXAPRO) 10 MG tablet Take 10 mg by mouth once daily   gabapentin (NEURONTIN) 300 MG capsule Take 300 mg by mouth at bedtime   multivitamin with iron-minerals (SUPER THERA VITE M) tablet Take 1 tablet by mouth   No current facility-administered medications on file prior to visit.   History reviewed. No pertinent family history.   Social History   Tobacco Use  Smoking Status Never  Smokeless Tobacco Never    Social History   Socioeconomic History   Marital status: Married  Tobacco Use   Smoking status: Never   Smokeless tobacco: Never  Vaping Use   Vaping Use: Never used  Substance and Sexual Activity   Alcohol use: Defer   Drug use: Defer   Objective:  There were no vitals filed for this visit.  There is no height or weight on file to calculate BMI.  Physical Exam Constitutional:  Appearance: Normal appearance.  Cardiovascular:  Rate and Rhythm: Normal rate.  Pulmonary:  Effort: Pulmonary effort is normal.  Chest:  Breasts: Left: Normal.   Comments: 4 cm mobile mass in the upper inner and lower inner quadrants. It is mobile. Mild skin thickening but no signs of inflammatory cancer. Musculoskeletal:  General: Normal range of motion.  Cervical back: Normal range of motion.  Lymphadenopathy:  Upper Body:  Right upper body: No supraclavicular or axillary adenopathy.  Left upper body: No supraclavicular or axillary adenopathy.  Neurological:  General: No focal deficit present.  Mental Status: She is alert.  Motor: Weakness present.  Psychiatric:  Mood and Affect: Mood normal.  Behavior: Behavior normal.     Labs, Imaging and Diagnostic Testing: ADDITIONAL INFORMATION: PROGNOSTIC INDICATORS Results: IMMUNOHISTOCHEMICAL AND MORPHOMETRIC ANALYSIS PERFORMED MANUALLY The tumor cells are POSITIVE for  Her2 (3+). Estrogen Receptor: 0%, NEGATIVE Progesterone Receptor: 0%, NEGATIVE Proliferation Marker Ki67: 98% COMMENT: The negative hormone receptor study(ies) in this case has an internal positive control. REFERENCE RANGE ESTROGEN RECEPTOR NEGATIVE 0% POSITIVE =>1% REFERENCE RANGE PROGESTERONE RECEPTOR NEGATIVE 0% POSITIVE =>1% All controls stained appropriately Sydney Chad MD Pathologist, Electronic Signature ( Signed 12/05/2021) FINAL DIAGNOSIS Diagnosis Breast, right, needle core biopsy, 3 o'clock, 4cmfn - INVASIVE DUCTAL CARCINOMA WITH NECROSIS, GRADE 3. - SEE NOTE 1 of 2 FINAL for Sydney Key ([id number]) Diagnosis Note The greatest tumor dimension is 1.4 cm. Breast prognostic markers will be performed. Dr. Alric Key agrees. Called to [employer] on 11/30/2021. Sydney Laws MD Pathologist, Electronic Signature (Case signed 11/30/2021) Specimen Gross and Clinical Information Specimen Comment TIF: 1:48 pm, 88 yo wf with BIRADS 5 mass worrisome for high grade IDC Specimen(s) Obtained: Breast, right, needle core biopsy, 3 o'clock, 4cmfn Specimen Clinical  Assessment and Plan:   Diagnoses and all orders for this visit:  Malignant neoplasm of lower-inner quadrant of right breast of female, estrogen receptor negative (CMS-HCC)   The patient overall is in good health for her age. She has been seen in medical oncology. Given her tumor size and HER2/neu status, neoadjuvant chemotherapy recommended. This will shrink the tumor to make breast conserving surgery possible. She does have a desire to keep her breast. Discussed port placement with risks and benefits of placement as well as alternatives to port placement. Risk of bleeding, infection, pneumothorax, hemothorax, injury to major vascular structures, injury to heart, need further treatment and or procedures discussed. She will need an MRI prior to neoadjuvant. Plan to schedule port placement soon to  initiate chemotherapy.  No follow-ups on file.  Sydney Francois, MD

## 2022-01-09 ENCOUNTER — Inpatient Hospital Stay: Payer: Medicare Other | Attending: Hematology and Oncology

## 2022-01-09 ENCOUNTER — Other Ambulatory Visit: Payer: Self-pay | Admitting: Hematology and Oncology

## 2022-01-09 ENCOUNTER — Inpatient Hospital Stay: Payer: Medicare Other

## 2022-01-09 ENCOUNTER — Other Ambulatory Visit: Payer: Self-pay

## 2022-01-09 ENCOUNTER — Other Ambulatory Visit: Payer: Medicare Other

## 2022-01-09 VITALS — BP 115/65 | HR 66 | Temp 97.5°F | Resp 19 | Wt 136.8 lb

## 2022-01-09 DIAGNOSIS — Z5111 Encounter for antineoplastic chemotherapy: Secondary | ICD-10-CM | POA: Diagnosis not present

## 2022-01-09 DIAGNOSIS — C50311 Malignant neoplasm of lower-inner quadrant of right female breast: Secondary | ICD-10-CM | POA: Insufficient documentation

## 2022-01-09 DIAGNOSIS — Z5112 Encounter for antineoplastic immunotherapy: Secondary | ICD-10-CM | POA: Diagnosis not present

## 2022-01-09 DIAGNOSIS — Z95828 Presence of other vascular implants and grafts: Secondary | ICD-10-CM

## 2022-01-09 LAB — COMPREHENSIVE METABOLIC PANEL
ALT: 18 U/L (ref 0–44)
AST: 16 U/L (ref 15–41)
Albumin: 3.8 g/dL (ref 3.5–5.0)
Alkaline Phosphatase: 63 U/L (ref 38–126)
Anion gap: 4 — ABNORMAL LOW (ref 5–15)
BUN: 23 mg/dL (ref 8–23)
CO2: 29 mmol/L (ref 22–32)
Calcium: 9.2 mg/dL (ref 8.9–10.3)
Chloride: 105 mmol/L (ref 98–111)
Creatinine, Ser: 0.71 mg/dL (ref 0.44–1.00)
GFR, Estimated: >60 mL/min (ref >60)
Glucose, Bld: 97 mg/dL (ref 70–99)
Potassium: 4.1 mmol/L (ref 3.5–5.1)
Sodium: 138 mmol/L (ref 135–145)
Total Bilirubin: 0.7 mg/dL (ref 0.3–1.2)
Total Protein: 6.5 g/dL (ref 6.5–8.1)

## 2022-01-09 LAB — CBC WITH DIFFERENTIAL/PLATELET
Abs Immature Granulocytes: 0.05 10*3/uL (ref 0.00–0.07)
Basophils Absolute: 0 10*3/uL (ref 0.0–0.1)
Basophils Relative: 1 %
Eosinophils Absolute: 0.1 10*3/uL (ref 0.0–0.5)
Eosinophils Relative: 2 %
HCT: 32.9 % — ABNORMAL LOW (ref 36.0–46.0)
Hemoglobin: 11 g/dL — ABNORMAL LOW (ref 12.0–15.0)
Immature Granulocytes: 1 %
Lymphocytes Relative: 35 %
Lymphs Abs: 1.2 10*3/uL (ref 0.7–4.0)
MCH: 31.5 pg (ref 26.0–34.0)
MCHC: 33.4 g/dL (ref 30.0–36.0)
MCV: 94.3 fL (ref 80.0–100.0)
Monocytes Absolute: 0.4 10*3/uL (ref 0.1–1.0)
Monocytes Relative: 10 %
Neutro Abs: 1.7 10*3/uL (ref 1.7–7.7)
Neutrophils Relative %: 51 %
Platelets: 226 10*3/uL (ref 150–400)
RBC: 3.49 MIL/uL — ABNORMAL LOW (ref 3.87–5.11)
RDW: 12.9 % (ref 11.5–15.5)
WBC: 3.5 10*3/uL — ABNORMAL LOW (ref 4.0–10.5)
nRBC: 0 % (ref 0.0–0.2)

## 2022-01-09 MED ORDER — SODIUM CHLORIDE 0.9 % IV SOLN
50.0000 mg/m2 | Freq: Once | INTRAVENOUS | Status: AC
  Administered 2022-01-09: 84 mg via INTRAVENOUS
  Filled 2022-01-09: qty 14

## 2022-01-09 MED ORDER — SODIUM CHLORIDE 0.9% FLUSH: 3.0000 mL | INTRAVENOUS | Status: DC | PRN

## 2022-01-09 MED ORDER — ACETAMINOPHEN 325 MG PO TABS
650.0000 mg | ORAL_TABLET | Freq: Once | ORAL | Status: AC
  Administered 2022-01-09: 650 mg via ORAL
  Filled 2022-01-09: qty 2

## 2022-01-09 MED ORDER — SODIUM CHLORIDE 0.9 % IV SOLN
10.0000 mg | Freq: Once | INTRAVENOUS | Status: AC
  Administered 2022-01-09: 10 mg via INTRAVENOUS
  Filled 2022-01-09: qty 10

## 2022-01-09 MED ORDER — SODIUM CHLORIDE 0.9% FLUSH
10.0000 mL | Freq: Once | INTRAVENOUS | Status: AC
  Administered 2022-01-09: 10 mL

## 2022-01-09 MED ORDER — HEPARIN SOD (PORK) LOCK FLUSH 100 UNIT/ML IV SOLN: 250.0000 [IU] | Freq: Once | INTRAVENOUS | Status: DC | PRN

## 2022-01-09 MED ORDER — FAMOTIDINE IN NACL 20-0.9 MG/50ML-% IV SOLN
20.0000 mg | Freq: Once | INTRAVENOUS | Status: AC
  Administered 2022-01-09: 20 mg via INTRAVENOUS
  Filled 2022-01-09: qty 50

## 2022-01-09 MED ORDER — SODIUM CHLORIDE 0.9 % IV SOLN: Freq: Once | INTRAVENOUS | Status: AC

## 2022-01-09 MED ORDER — HEPARIN SOD (PORK) LOCK FLUSH 100 UNIT/ML IV SOLN
500.0000 [IU] | Freq: Once | INTRAVENOUS | Status: AC | PRN
  Administered 2022-01-09: 500 [IU]

## 2022-01-09 MED ORDER — SODIUM CHLORIDE 0.9% FLUSH
10.0000 mL | INTRAVENOUS | Status: DC | PRN
  Administered 2022-01-09: 10 mL

## 2022-01-09 MED ORDER — ALTEPLASE 2 MG IJ SOLR: 2.0000 mg | Freq: Once | INTRAMUSCULAR | Status: DC | PRN

## 2022-01-09 MED ORDER — TRASTUZUMAB-DKST CHEMO 150 MG IV SOLR
2.0000 mg/kg | Freq: Once | INTRAVENOUS | Status: AC
  Administered 2022-01-09: 126 mg via INTRAVENOUS
  Filled 2022-01-09: qty 6

## 2022-01-09 MED ORDER — DIPHENHYDRAMINE HCL 50 MG/ML IJ SOLN
12.5000 mg | Freq: Once | INTRAMUSCULAR | Status: AC
  Administered 2022-01-09: 12.5 mg via INTRAVENOUS
  Filled 2022-01-09: qty 1

## 2022-01-09 NOTE — Patient Instructions (Signed)
[facility] ONCOLOG[facility]  Discharge Instructions: Thank you for choosing [facility] to provide your oncology and hematology care.   If you have a lab appointment with the [facility], please go directly to the [facility] and check in at the registration area.   Wear comfortable clothing and clothing appropriate for easy access to any Portacath or PICC line.   We strive to give you quality time with your provider. You may need to reschedule your appointment if you arrive late (15 or more minutes).  Arriving late affects you and other patients whose appointments are after yours.  Also, if you miss three or more appointments without notifying the office, you may be dismissed from the clinic at the provider's discretion.      For prescription refill requests, have your pharmacy contact our office and allow 72 hours for refills to be completed.    Today you received the following chemotherapy and/or immunotherapy agents: Trastuzumab, Paclitaxel.       To help prevent nausea and vomiting after your treatment, we encourage you to take your nausea medication as directed.  BELOW ARE SYMPTOMS THAT SHOULD BE REPORTED IMMEDIATELY: *FEVER GREATER THAN 100.4 F (38 C) OR HIGHER *CHILLS OR SWEATING *NAUSEA AND VOMITING THAT IS NOT CONTROLLED WITH YOUR NAUSEA MEDICATION *UNUSUAL SHORTNESS OF BREATH *UNUSUAL BRUISING OR BLEEDING *URINARY PROBLEMS (pain or burning when urinating, or frequent urination) *BOWEL PROBLEMS (unusual diarrhea, constipation, pain near the anus) TENDERNESS IN MOUTH AND THROAT WITH OR WITHOUT PRESENCE OF ULCERS (sore throat, sores in mouth, or a toothache) UNUSUAL RASH, SWELLING OR PAIN  UNUSUAL VAGINAL DISCHARGE OR ITCHING   Items with * indicate a potential emergency and should be followed up as soon as possible or go to the Emergency Department if any problems should occur.  Please show the CHEMOTHERAPY ALERT CARD or IMMUNOTHERAPY ALERT CARD  at check-in to the Emergency Department and triage nurse.  Should you have questions after your visit or need to cancel or reschedule your appointment, please contact [facility]  Dept: [phone]  and follow the prompts.  Office hours are 8:00 a.m. to 4:30 p.m. Monday - Friday. Please note that voicemails left after 4:00 p.m. may not be returned until the following business day.  We are closed weekends and major holidays. You have access to a nurse at all times for urgent questions. Please call the main number to the clinic Dept: [phone] and follow the prompts.   For any non-urgent questions, you may also contact your provider using MyChart. We now offer e-Visits for anyone 86 and older to request care online for non-urgent symptoms. For details visit [email or web address].   Also download the MyChart app! Go to the app store, search "MyChart", open the app, select [facility], and log in with your MyChart username and password.  Masks are optional in the cancer centers. If you would like for your care team to wear a mask while they are taking care of you, please let them know. For doctor visits, patients may have with them one support person who is at least 86 years old. At this time, visitors are not allowed in the infusion area.

## 2022-01-11 DIAGNOSIS — E538 Deficiency of other specified B group vitamins: Secondary | ICD-10-CM | POA: Diagnosis not present

## 2022-01-14 ENCOUNTER — Encounter (HOSPITAL_BASED_OUTPATIENT_CLINIC_OR_DEPARTMENT_OTHER): Payer: Self-pay | Admitting: Surgery

## 2022-01-14 MED FILL — Dexamethasone Sodium Phosphate Inj 100 MG/10ML: INTRAMUSCULAR | Qty: 1 | Status: AC

## 2022-01-15 ENCOUNTER — Other Ambulatory Visit: Payer: Self-pay

## 2022-01-15 ENCOUNTER — Inpatient Hospital Stay: Payer: Medicare Other

## 2022-01-15 ENCOUNTER — Inpatient Hospital Stay (HOSPITAL_BASED_OUTPATIENT_CLINIC_OR_DEPARTMENT_OTHER): Payer: Medicare Other | Admitting: Hematology and Oncology

## 2022-01-15 DIAGNOSIS — C50311 Malignant neoplasm of lower-inner quadrant of right female breast: Secondary | ICD-10-CM

## 2022-01-15 DIAGNOSIS — Z5112 Encounter for antineoplastic immunotherapy: Secondary | ICD-10-CM | POA: Diagnosis not present

## 2022-01-15 DIAGNOSIS — Z171 Estrogen receptor negative status [ER-]: Secondary | ICD-10-CM | POA: Diagnosis not present

## 2022-01-15 DIAGNOSIS — Z5111 Encounter for antineoplastic chemotherapy: Secondary | ICD-10-CM | POA: Diagnosis not present

## 2022-01-15 DIAGNOSIS — Z95828 Presence of other vascular implants and grafts: Secondary | ICD-10-CM

## 2022-01-15 LAB — CBC WITH DIFFERENTIAL/PLATELET
Abs Immature Granulocytes: 0.03 10*3/uL (ref 0.00–0.07)
Basophils Absolute: 0 10*3/uL (ref 0.0–0.1)
Basophils Relative: 1 %
Eosinophils Absolute: 0 10*3/uL (ref 0.0–0.5)
Eosinophils Relative: 1 %
HCT: 33.2 % — ABNORMAL LOW (ref 36.0–46.0)
Hemoglobin: 11 g/dL — ABNORMAL LOW (ref 12.0–15.0)
Immature Granulocytes: 1 %
Lymphocytes Relative: 32 %
Lymphs Abs: 1.3 10*3/uL (ref 0.7–4.0)
MCH: 31.4 pg (ref 26.0–34.0)
MCHC: 33.1 g/dL (ref 30.0–36.0)
MCV: 94.9 fL (ref 80.0–100.0)
Monocytes Absolute: 0.3 10*3/uL (ref 0.1–1.0)
Monocytes Relative: 7 %
Neutro Abs: 2.5 10*3/uL (ref 1.7–7.7)
Neutrophils Relative %: 58 %
Platelets: 244 10*3/uL (ref 150–400)
RBC: 3.5 MIL/uL — ABNORMAL LOW (ref 3.87–5.11)
RDW: 12.9 % (ref 11.5–15.5)
WBC: 4.2 10*3/uL (ref 4.0–10.5)
nRBC: 0 % (ref 0.0–0.2)

## 2022-01-15 LAB — COMPREHENSIVE METABOLIC PANEL
ALT: 17 U/L (ref 0–44)
AST: 17 U/L (ref 15–41)
Albumin: 3.9 g/dL (ref 3.5–5.0)
Alkaline Phosphatase: 58 U/L (ref 38–126)
Anion gap: 4 — ABNORMAL LOW (ref 5–15)
BUN: 16 mg/dL (ref 8–23)
CO2: 28 mmol/L (ref 22–32)
Calcium: 9.2 mg/dL (ref 8.9–10.3)
Chloride: 106 mmol/L (ref 98–111)
Creatinine, Ser: 0.67 mg/dL (ref 0.44–1.00)
GFR, Estimated: >60 mL/min (ref >60)
Glucose, Bld: 92 mg/dL (ref 70–99)
Potassium: 4.2 mmol/L (ref 3.5–5.1)
Sodium: 138 mmol/L (ref 135–145)
Total Bilirubin: 0.8 mg/dL (ref 0.3–1.2)
Total Protein: 6.6 g/dL (ref 6.5–8.1)

## 2022-01-15 MED ORDER — HEPARIN SOD (PORK) LOCK FLUSH 100 UNIT/ML IV SOLN
500.0000 [IU] | Freq: Once | INTRAVENOUS | Status: AC | PRN
  Administered 2022-01-15: 500 [IU]

## 2022-01-15 MED ORDER — TRASTUZUMAB-DKST CHEMO 150 MG IV SOLR
2.0000 mg/kg | Freq: Once | INTRAVENOUS | Status: AC
  Administered 2022-01-15: 126 mg via INTRAVENOUS
  Filled 2022-01-15: qty 6

## 2022-01-15 MED ORDER — ACETAMINOPHEN 325 MG PO TABS
650.0000 mg | ORAL_TABLET | Freq: Once | ORAL | Status: AC
  Administered 2022-01-15: 650 mg via ORAL
  Filled 2022-01-15: qty 2

## 2022-01-15 MED ORDER — SODIUM CHLORIDE 0.9 % IV SOLN: Freq: Once | INTRAVENOUS | Status: AC

## 2022-01-15 MED ORDER — SODIUM CHLORIDE 0.9% FLUSH
10.0000 mL | INTRAVENOUS | Status: DC | PRN
  Administered 2022-01-15: 10 mL

## 2022-01-15 MED ORDER — SODIUM CHLORIDE 0.9 % IV SOLN
50.0000 mg/m2 | Freq: Once | INTRAVENOUS | Status: AC
  Administered 2022-01-15: 84 mg via INTRAVENOUS
  Filled 2022-01-15: qty 14

## 2022-01-15 MED ORDER — FAMOTIDINE IN NACL 20-0.9 MG/50ML-% IV SOLN
20.0000 mg | Freq: Once | INTRAVENOUS | Status: AC
  Administered 2022-01-15: 20 mg via INTRAVENOUS
  Filled 2022-01-15: qty 50

## 2022-01-15 MED ORDER — SODIUM CHLORIDE 0.9% FLUSH
10.0000 mL | Freq: Once | INTRAVENOUS | Status: AC
  Administered 2022-01-15: 10 mL

## 2022-01-15 MED ORDER — SODIUM CHLORIDE 0.9 % IV SOLN
10.0000 mg | Freq: Once | INTRAVENOUS | Status: AC
  Administered 2022-01-15: 10 mg via INTRAVENOUS
  Filled 2022-01-15: qty 10

## 2022-01-15 MED ORDER — DIPHENHYDRAMINE HCL 50 MG/ML IJ SOLN
12.5000 mg | Freq: Once | INTRAMUSCULAR | Status: AC
  Administered 2022-01-15: 12.5 mg via INTRAVENOUS
  Filled 2022-01-15: qty 1

## 2022-01-15 NOTE — Progress Notes (Signed)
Patient Care Team: Raelene Bott, MD as PCP - General (Internal Medicine) Mauro Kaufmann, RN as Oncology Nurse Navigator Rockwell Germany, RN as Oncology Nurse Navigator Erroll Luna, MD as Consulting Physician (General Surgery) Nicholas Lose, MD as Consulting Physician (Hematology and Oncology) Eppie Gibson, MD as Attending Physician (Radiation Oncology)  DIAGNOSIS:  Encounter Diagnosis  Name Primary?   Malignant neoplasm of lower-inner quadrant of right breast of female, estrogen receptor negative ([place])     SUMMARY OF ONCOLOGIC HISTORY: Oncology History  Malignant neoplasm of lower-inner quadrant of right breast of female, estrogen receptor negative ([place])  11/28/2021 Initial Diagnosis   Palpable right breast lump 2.6 cm with calcifications at 3 o'clock position, additional 1.3 cm lesion is an intramammary lymph node biopsy: Benign, axilla negative, biopsy of the breast lump: Grade 3 IDC ER 0%, PR 0%, Ki-67 98%, HER2 3+ positive   12/05/2021 Cancer Staging   Staging form: Breast, AJCC 8th Edition - Clinical: Stage IIA (cT2, cN0, cM0, G3, ER-, PR-, HER2+) - Signed by Nicholas Lose, MD on 12/05/2021 Stage prefix: Initial diagnosis Histologic grading system: 3 grade system   12/26/2021 -  Chemotherapy   Patient is on Treatment Plan : BREAST Paclitaxel + Trastuzumab q7d / Trastuzumab q21d      Genetic Testing   [facility] CustomNext Panel was Negative. Report date is 12/18/2021.  The CustomNext-Cancer+RNAinsight panel offered by [facility] includes sequencing and rearrangement analysis for the following 47 genes:  APC, ATM, AXIN2, BARD1, BMPR1A, BRCA1, BRCA2, BRIP1, CDH1, CDK4, CDKN2A, CHEK2, CTNNA1, DICER1, EPCAM, GREM1, HOXB13, KIT, MEN1, MLH1, MSH2, MSH3, MSH6, MUTYH, NBN, NF1, NTHL1, PALB2, PDGFRA, PMS2, POLD1, POLE, PTEN, RAD50, RAD51C, RAD51D, SDHA, SDHB, SDHC, SDHD, SMAD4, SMARCA4, STK11, TP53, TSC1, TSC2, and VHL.  RNA data is routinely analyzed for use in variant  interpretation for all genes.     CHIEF COMPLIANT: Follow-up on cycle 4 Taxol herceptin  INTERVAL HISTORY: Sydney Key is a  88 y.o. female is here because of recent diagnosis of right breast cancer. She presents to the clinic today for a follow-up. States that she has some diarrhea 3 times a day. Denies numbness and tingling.    ALLERGIES:  is allergic to paclitaxel.  MEDICATIONS:  Current Outpatient Medications  Medication Sig Dispense Refill   acetaminophen (TYLENOL) 500 MG tablet Take 500 mg by mouth at bedtime.     acetaminophen (TYLENOL) 650 MG CR tablet Take 650 mg by mouth every morning.     Ascorbic Acid (VITAMIN C WITH ROSE HIPS) 500 MG tablet Take 500 mg by mouth daily.     aspirin EC 81 MG tablet Take 81 mg by mouth every evening.     brimonidine (ALPHAGAN) 0.15 % ophthalmic solution Place 1 drop into the left eye 2 (two) times daily.     cholecalciferol (VITAMIN D) 1000 UNITS tablet Take 1,000 Units by mouth daily.     cyanocobalamin (,VITAMIN B-12,) 1000 MCG/ML injection Inject 1,000 mcg into the muscle every 30 (thirty) days.     escitalopram (LEXAPRO) 10 MG tablet Take 5 mg by mouth at bedtime.     gabapentin (NEURONTIN) 300 MG capsule Take 300 mg by mouth at bedtime.     lidocaine-prilocaine (EMLA) cream Apply to affected area once 30 g 3   Multiple Vitamin (MULTIVITAMIN WITH MINERALS) TABS tablet Take 1 tablet by mouth daily. Woman's 1 a day multivitamin     ondansetron (ZOFRAN) 8 MG tablet Take 1 tablet (8 mg total) by mouth  2 (two) times daily as needed (Nausea or vomiting). 30 tablet 1   oxyCODONE (OXY IR/ROXICODONE) 5 MG immediate release tablet Take 1 tablet (5 mg total) by mouth every 6 (six) hours as needed for severe pain. 15 tablet 0   prochlorperazine (COMPAZINE) 10 MG tablet Take 1 tablet (10 mg total) by mouth every 6 (six) hours as needed (Nausea or vomiting). 30 tablet 1   No current facility-administered medications for this visit.    PHYSICAL  EXAMINATION: ECOG PERFORMANCE STATUS: 1 - Symptomatic but completely ambulatory  Vitals:   01/15/22 1056  BP: (!) 128/91  Pulse: 67  Resp: 18  Temp: 97.8 F (36.6 C)  SpO2: 100%   Filed Weights   01/15/22 1056  Weight: 138 lb 8 oz (62.8 kg)     LABORATORY DATA:  I have reviewed the data as listed    Latest Ref Rng & Units 01/09/2022   10:17 AM 01/01/2022   10:46 AM 12/26/2021    9:02 AM  CMP  Glucose 70 - 99 mg/dL 97  103  98   BUN 8 - 23 mg/dL _0 Creatinine 0.44 - 1.00 mg/dL 0.71  0.64  0.72   Sodium 135 - 145 mmol/L 138  138  140   Potassium 3.5 - 5.1 mmol/L 4.1  4.2  4.2   Chloride 98 - 111 mmol/L 105  108  108   CO2 22 - 32 mmol/L _1 Calcium 8.9 - 10.3 mg/dL 9.2  9.2  9.6   Total Protein 6.5 - 8.1 g/dL 6.5  6.5  6.8   Total Bilirubin 0.3 - 1.2 mg/dL 0.7  0.9  1.0   Alkaline Phos 38 - 126 U/L 63  60  56   AST 15 - 41 U/L _2 ALT 0 - 44 U/L _3 Lab Results  Component Value Date   WBC 4.2 01/15/2022   HGB 11.0 (L) 01/15/2022   HCT 33.2 (L) 01/15/2022   MCV 94.9 01/15/2022   PLT 244 01/15/2022   NEUTROABS 2.5 01/15/2022    ASSESSMENT & PLAN:  Malignant neoplasm of lower-inner quadrant of right breast of female, estrogen receptor negative ([place]) 11/28/2021:Palpable right breast lump 2.6 cm with calcifications at 3 o'clock position, additional 1.3 cm lesion is an intramammary lymph node biopsy: Benign, axilla negative, biopsy of the breast lump: Grade 3 IDC ER 0%, PR 0%, Ki-67 98%, HER2 3+ positive   Treatment plan: 1.  Neoadjuvant chemotherapy with Taxol Herceptin weekly x12 followed by Herceptin maintenance 2. Breast conserving surgery with sentinel lymph node biopsy 3.  Adjuvant radiation ----------------------------------------------------------------------------------------------------------------- Current Treatment: Cycle 4 Taxol-Herceptin ECHO 12/13/21: EF 60-65% Labs reviewed.   Chemotoxicities: Denies any adverse  effects to chemotherapy. Monitoring her blood counts. We gave her a lower dosage of chemo and will keep the dosage the same.  Patient is wondering if she can have fewer number of chemo treatment therapy treatments.  I suggested that we might obtain a mammogram and ultrasound after 6 rounds of chemo and then make a decision on that.   Return to clinic weekly for Taxol Herceptin treatments and every other week for follow-up with me.    No orders of the defined types were placed in this encounter.  The patient has a good understanding of the overall plan. she agrees with it. she will call with any problems that  may develop before the next visit here. Total time spent: 30 mins including face to face time and time spent for planning, charting and co-ordination of care   Harriette Ohara, MD 01/15/22    I Gardiner Coins am scribing for Dr. Lindi Adie  I have reviewed the above documentation for accuracy and completeness, and I agree with the above.

## 2022-01-15 NOTE — Assessment & Plan Note (Addendum)
11/28/2021:Palpable right breast lump 2.6 cm with calcifications at 3 o'clock position, additional 1.3 cm lesion is an intramammary lymph node biopsy: Benign, axilla negative, biopsy of the breast lump: Grade 3 IDC ER 0%, PR 0%, Ki-67 98%, HER2 3+ positive  Treatment plan: 1.Neoadjuvant chemotherapy with Taxol Herceptin weekly x12 followed by Herceptin maintenance 2.Breast conserving surgery with sentinel lymph node biopsy 3.Adjuvant radiation ----------------------------------------------------------------------------------------------------------------- Current Treatment: Cycle 4 Taxol-Herceptin ECHO 12/13/21: EF 60-65% Labs reviewed.  Chemotoxicities: Denies any adverse effects to chemotherapy. Monitoring her blood counts. We gave her a lower dosage of chemo and will keep the dosage the same.  Patient is wondering if she can have fewer number of chemo treatment therapy treatments.  I suggested that we might obtain a mammogram and ultrasound after 6 rounds of chemo and then make a decision on that.  Return to clinic weekly for Taxol Herceptin treatments and every other week for follow-up with me.

## 2022-01-15 NOTE — Patient Instructions (Signed)
[facility] ONCOLOG[facility]  Discharge Instructions: Thank you for choosing [facility] to provide your oncology and hematology care.   If you have a lab appointment with the [facility], please go directly to the [facility] and check in at the registration area.   Wear comfortable clothing and clothing appropriate for easy access to any Portacath or PICC line.   We strive to give you quality time with your provider. You may need to reschedule your appointment if you arrive late (15 or more minutes).  Arriving late affects you and other patients whose appointments are after yours.  Also, if you miss three or more appointments without notifying the office, you may be dismissed from the clinic at the provider's discretion.      For prescription refill requests, have your pharmacy contact our office and allow 72 hours for refills to be completed.    Today you received the following chemotherapy and/or immunotherapy agents Ogivri & Taxol      To help prevent nausea and vomiting after your treatment, we encourage you to take your nausea medication as directed.  BELOW ARE SYMPTOMS THAT SHOULD BE REPORTED IMMEDIATELY: *FEVER GREATER THAN 100.4 F (38 C) OR HIGHER *CHILLS OR SWEATING *NAUSEA AND VOMITING THAT IS NOT CONTROLLED WITH YOUR NAUSEA MEDICATION *UNUSUAL SHORTNESS OF BREATH *UNUSUAL BRUISING OR BLEEDING *URINARY PROBLEMS (pain or burning when urinating, or frequent urination) *BOWEL PROBLEMS (unusual diarrhea, constipation, pain near the anus) TENDERNESS IN MOUTH AND THROAT WITH OR WITHOUT PRESENCE OF ULCERS (sore throat, sores in mouth, or a toothache) UNUSUAL RASH, SWELLING OR PAIN  UNUSUAL VAGINAL DISCHARGE OR ITCHING   Items with * indicate a potential emergency and should be followed up as soon as possible or go to the Emergency Department if any problems should occur.  Please show the CHEMOTHERAPY ALERT CARD or IMMUNOTHERAPY ALERT CARD at check-in  to the Emergency Department and triage nurse.  Should you have questions after your visit or need to cancel or reschedule your appointment, please contact [facility]  Dept: [phone]  and follow the prompts.  Office hours are 8:00 a.m. to 4:30 p.m. Monday - Friday. Please note that voicemails left after 4:00 p.m. may not be returned until the following business day.  We are closed weekends and major holidays. You have access to a nurse at all times for urgent questions. Please call the main number to the clinic Dept: [phone] and follow the prompts.   For any non-urgent questions, you may also contact your provider using MyChart. We now offer e-Visits for anyone 31 and older to request care online for non-urgent symptoms. For details visit [email or web address].   Also download the MyChart app! Go to the app store, search "MyChart", open the app, select [facility], and log in with your MyChart username and password.  Masks are optional in the cancer centers. If you would like for your care team to wear a mask while they are taking care of you, please let them know. For doctor visits, patients may have with them one support person who is at least 86 years old. At this time, visitors are not allowed in the infusion area.

## 2022-01-21 MED FILL — Dexamethasone Sodium Phosphate Inj 100 MG/10ML: INTRAMUSCULAR | Qty: 1 | Status: AC

## 2022-01-22 ENCOUNTER — Inpatient Hospital Stay: Payer: Medicare Other

## 2022-01-22 ENCOUNTER — Other Ambulatory Visit: Payer: Self-pay

## 2022-01-22 VITALS — BP 123/63 | HR 61 | Temp 98.0°F | Resp 17 | Wt 136.5 lb

## 2022-01-22 DIAGNOSIS — Z95828 Presence of other vascular implants and grafts: Secondary | ICD-10-CM

## 2022-01-22 DIAGNOSIS — C50311 Malignant neoplasm of lower-inner quadrant of right female breast: Secondary | ICD-10-CM

## 2022-01-22 DIAGNOSIS — Z5112 Encounter for antineoplastic immunotherapy: Secondary | ICD-10-CM | POA: Diagnosis not present

## 2022-01-22 DIAGNOSIS — Z5111 Encounter for antineoplastic chemotherapy: Secondary | ICD-10-CM | POA: Diagnosis not present

## 2022-01-22 LAB — COMPREHENSIVE METABOLIC PANEL
ALT: 18 U/L (ref 0–44)
AST: 18 U/L (ref 15–41)
Albumin: 4 g/dL (ref 3.5–5.0)
Alkaline Phosphatase: 62 U/L (ref 38–126)
Anion gap: 4 — ABNORMAL LOW (ref 5–15)
BUN: 21 mg/dL (ref 8–23)
CO2: 28 mmol/L (ref 22–32)
Calcium: 9.3 mg/dL (ref 8.9–10.3)
Chloride: 105 mmol/L (ref 98–111)
Creatinine, Ser: 0.67 mg/dL (ref 0.44–1.00)
GFR, Estimated: >60 mL/min (ref >60)
Glucose, Bld: 94 mg/dL (ref 70–99)
Potassium: 4.3 mmol/L (ref 3.5–5.1)
Sodium: 137 mmol/L (ref 135–145)
Total Bilirubin: 0.8 mg/dL (ref 0.3–1.2)
Total Protein: 6.6 g/dL (ref 6.5–8.1)

## 2022-01-22 LAB — CBC WITH DIFFERENTIAL/PLATELET
Abs Immature Granulocytes: 0.03 10*3/uL (ref 0.00–0.07)
Basophils Absolute: 0 10*3/uL (ref 0.0–0.1)
Basophils Relative: 1 %
Eosinophils Absolute: 0.1 10*3/uL (ref 0.0–0.5)
Eosinophils Relative: 1 %
HCT: 34 % — ABNORMAL LOW (ref 36.0–46.0)
Hemoglobin: 11.3 g/dL — ABNORMAL LOW (ref 12.0–15.0)
Immature Granulocytes: 1 %
Lymphocytes Relative: 34 %
Lymphs Abs: 1.3 10*3/uL (ref 0.7–4.0)
MCH: 31.6 pg (ref 26.0–34.0)
MCHC: 33.2 g/dL (ref 30.0–36.0)
MCV: 95 fL (ref 80.0–100.0)
Monocytes Absolute: 0.3 10*3/uL (ref 0.1–1.0)
Monocytes Relative: 9 %
Neutro Abs: 2.1 10*3/uL (ref 1.7–7.7)
Neutrophils Relative %: 54 %
Platelets: 238 10*3/uL (ref 150–400)
RBC: 3.58 MIL/uL — ABNORMAL LOW (ref 3.87–5.11)
RDW: 13.2 % (ref 11.5–15.5)
WBC: 3.9 10*3/uL — ABNORMAL LOW (ref 4.0–10.5)
nRBC: 0 % (ref 0.0–0.2)

## 2022-01-22 MED ORDER — ACETAMINOPHEN 325 MG PO TABS
650.0000 mg | ORAL_TABLET | Freq: Once | ORAL | Status: AC
  Administered 2022-01-22: 650 mg via ORAL
  Filled 2022-01-22: qty 2

## 2022-01-22 MED ORDER — HEPARIN SOD (PORK) LOCK FLUSH 100 UNIT/ML IV SOLN: 500.0000 [IU] | Freq: Once | INTRAVENOUS | Status: DC | PRN

## 2022-01-22 MED ORDER — SODIUM CHLORIDE 0.9 % IV SOLN: Freq: Once | INTRAVENOUS | Status: AC

## 2022-01-22 MED ORDER — SODIUM CHLORIDE 0.9 % IV SOLN
10.0000 mg | Freq: Once | INTRAVENOUS | Status: AC
  Administered 2022-01-22: 10 mg via INTRAVENOUS
  Filled 2022-01-22: qty 10

## 2022-01-22 MED ORDER — SODIUM CHLORIDE 0.9 % IV SOLN
50.0000 mg/m2 | Freq: Once | INTRAVENOUS | Status: AC
  Administered 2022-01-22: 84 mg via INTRAVENOUS
  Filled 2022-01-22: qty 14

## 2022-01-22 MED ORDER — SODIUM CHLORIDE 0.9% FLUSH
10.0000 mL | Freq: Once | INTRAVENOUS | Status: AC
  Administered 2022-01-22: 10 mL

## 2022-01-22 MED ORDER — DIPHENHYDRAMINE HCL 50 MG/ML IJ SOLN
12.5000 mg | Freq: Once | INTRAMUSCULAR | Status: AC
  Administered 2022-01-22: 12.5 mg via INTRAVENOUS
  Filled 2022-01-22: qty 1

## 2022-01-22 MED ORDER — SODIUM CHLORIDE 0.9% FLUSH: 10.0000 mL | INTRAVENOUS | Status: DC | PRN

## 2022-01-22 MED ORDER — FAMOTIDINE IN NACL 20-0.9 MG/50ML-% IV SOLN
20.0000 mg | Freq: Once | INTRAVENOUS | Status: AC
  Administered 2022-01-22: 20 mg via INTRAVENOUS
  Filled 2022-01-22: qty 50

## 2022-01-22 MED ORDER — TRASTUZUMAB-DKST CHEMO 150 MG IV SOLR
2.0000 mg/kg | Freq: Once | INTRAVENOUS | Status: AC
  Administered 2022-01-22: 126 mg via INTRAVENOUS
  Filled 2022-01-22: qty 6

## 2022-01-22 NOTE — Patient Instructions (Signed)
[facility] ONCOLOGY  Discharge Instructions: Thank you for choosing [facility] to provide your oncology and hematology care.   If you have a lab appointment with the [facility], please go directly to the [facility] and check in at the registration area.   Wear comfortable clothing and clothing appropriate for easy access to any Portacath or PICC line.   We strive to give you quality time with your provider. You may need to reschedule your appointment if you arrive late (15 or more minutes).  Arriving late affects you and other patients whose appointments are after yours.  Also, if you miss three or more appointments without notifying the office, you may be dismissed from the clinic at the provider's discretion.      For prescription refill requests, have your pharmacy contact our office and allow 72 hours for refills to be completed.    Today you received the following chemotherapy and/or immunotherapy agents: paclitaxel, trastuzumab      To help prevent nausea and vomiting after your treatment, we encourage you to take your nausea medication as directed.  BELOW ARE SYMPTOMS THAT SHOULD BE REPORTED IMMEDIATELY: *FEVER GREATER THAN 100.4 F (38 C) OR HIGHER *CHILLS OR SWEATING *NAUSEA AND VOMITING THAT IS NOT CONTROLLED WITH YOUR NAUSEA MEDICATION *UNUSUAL SHORTNESS OF BREATH *UNUSUAL BRUISING OR BLEEDING *URINARY PROBLEMS (pain or burning when urinating, or frequent urination) *BOWEL PROBLEMS (unusual diarrhea, constipation, pain near the anus) TENDERNESS IN MOUTH AND THROAT WITH OR WITHOUT PRESENCE OF ULCERS (sore throat, sores in mouth, or a toothache) UNUSUAL RASH, SWELLING OR PAIN  UNUSUAL VAGINAL DISCHARGE OR ITCHING   Items with * indicate a potential emergency and should be followed up as soon as possible or go to the Emergency Department if any problems should occur.  Please show the CHEMOTHERAPY ALERT CARD or IMMUNOTHERAPY ALERT CARD  at check-in to the Emergency Department and triage nurse.  Should you have questions after your visit or need to cancel or reschedule your appointment, please contact [facility]  Dept: [phone]  and follow the prompts.  Office hours are 8:00 a.m. to 4:30 p.m. Monday - Friday. Please note that voicemails left after 4:00 p.m. may not be returned until the following business day.  We are closed weekends and major holidays. You have access to a nurse at all times for urgent questions. Please call the main number to the clinic Dept: [phone] and follow the prompts.   For any non-urgent questions, you may also contact your provider using MyChart. We now offer e-Visits for anyone 63 and older to request care online for non-urgent symptoms. For details visit [email or web address].   Also download the MyChart app! Go to the app store, search "MyChart", open the app, select [facility], and log in with your MyChart username and password.  Masks are optional in the cancer centers. If you would like for your care team to wear a mask while they are taking care of you, please let them know. For doctor visits, patients may have with them one support person who is at least 86 years old. At this time, visitors are not allowed in the infusion area.

## 2022-01-24 ENCOUNTER — Telehealth: Payer: Self-pay | Admitting: *Deleted

## 2022-01-24 NOTE — Telephone Encounter (Signed)
Received message that patient's insurance has denied a couple of visits due to no authorization. Sent message to revenue team with codes for them to reach out to the patient for help with this.

## 2022-01-28 ENCOUNTER — Other Ambulatory Visit: Payer: Self-pay

## 2022-01-28 DIAGNOSIS — D492 Neoplasm of unspecified behavior of bone, soft tissue, and skin: Secondary | ICD-10-CM | POA: Diagnosis not present

## 2022-01-28 DIAGNOSIS — C44729 Squamous cell carcinoma of skin of left lower limb, including hip: Secondary | ICD-10-CM | POA: Diagnosis not present

## 2022-01-28 DIAGNOSIS — L565 Disseminated superficial actinic porokeratosis (DSAP): Secondary | ICD-10-CM | POA: Diagnosis not present

## 2022-01-28 DIAGNOSIS — L570 Actinic keratosis: Secondary | ICD-10-CM | POA: Diagnosis not present

## 2022-01-28 MED FILL — Dexamethasone Sodium Phosphate Inj 100 MG/10ML: INTRAMUSCULAR | Qty: 1 | Status: AC

## 2022-01-28 NOTE — Progress Notes (Signed)
Patient Care Team: Raelene Bott, MD as PCP - General (Internal Medicine) Mauro Kaufmann, RN as Oncology Nurse Navigator Rockwell Germany, RN as Oncology Nurse Navigator Erroll Luna, MD as Consulting Physician (General Surgery) Nicholas Lose, MD as Consulting Physician (Hematology and Oncology) Eppie Gibson, MD as Attending Physician (Radiation Oncology)  DIAGNOSIS: No diagnosis found.  SUMMARY OF ONCOLOGIC HISTORY: Oncology History  Malignant neoplasm of lower-inner quadrant of right breast of female, estrogen receptor negative ([facility])  11/28/2021 Initial Diagnosis   Palpable right breast lump 2.6 cm with calcifications at 3 o'clock position, additional 1.3 cm lesion is an intramammary lymph node biopsy: Benign, axilla negative, biopsy of the breast lump: Grade 3 IDC ER 0%, PR 0%, Ki-67 98%, HER2 3+ positive   12/05/2021 Cancer Staging   Staging form: Breast, AJCC 8th Edition - Clinical: Stage IIA (cT2, cN0, cM0, G3, ER-, PR-, HER2+) - Signed by Nicholas Lose, MD on 12/05/2021 Stage prefix: Initial diagnosis Histologic grading system: 3 grade system   12/26/2021 -  Chemotherapy   Patient is on Treatment Plan : BREAST Paclitaxel + Trastuzumab q7d / Trastuzumab q21d      Genetic Testing   [facility] CustomNext Panel was Negative. Report date is 12/18/2021.  The CustomNext-Cancer+RNAinsight panel offered by [facility] includes sequencing and rearrangement analysis for the following 47 genes:  APC, ATM, AXIN2, BARD1, BMPR1A, BRCA1, BRCA2, BRIP1, CDH1, CDK4, CDKN2A, CHEK2, CTNNA1, DICER1, EPCAM, GREM1, HOXB13, KIT, MEN1, MLH1, MSH2, MSH3, MSH6, MUTYH, NBN, NF1, NTHL1, PALB2, PDGFRA, PMS2, POLD1, POLE, PTEN, RAD50, RAD51C, RAD51D, SDHA, SDHB, SDHC, SDHD, SMAD4, SMARCA4, STK11, TP53, TSC1, TSC2, and VHL.  RNA data is routinely analyzed for use in variant interpretation for all genes.     CHIEF COMPLIANT:  Follow-up on cycle 5 Taxol herceptin  INTERVAL HISTORY: Sydney Key is a 88  y.o. female is here because of recent diagnosis of right breast cancer. She presents to the clinic today for a follow-up.   ALLERGIES:  is allergic to paclitaxel.  MEDICATIONS:  Current Outpatient Medications  Medication Sig Dispense Refill   acetaminophen (TYLENOL) 500 MG tablet Take 500 mg by mouth at bedtime.     acetaminophen (TYLENOL) 650 MG CR tablet Take 650 mg by mouth every morning.     Ascorbic Acid (VITAMIN C WITH ROSE HIPS) 500 MG tablet Take 500 mg by mouth daily.     aspirin EC 81 MG tablet Take 81 mg by mouth every evening.     brimonidine (ALPHAGAN) 0.15 % ophthalmic solution Place 1 drop into the left eye 2 (two) times daily.     cholecalciferol (VITAMIN D) 1000 UNITS tablet Take 1,000 Units by mouth daily.     cyanocobalamin (,VITAMIN B-12,) 1000 MCG/ML injection Inject 1,000 mcg into the muscle every 30 (thirty) days.     escitalopram (LEXAPRO) 10 MG tablet Take 5 mg by mouth at bedtime.     gabapentin (NEURONTIN) 300 MG capsule Take 300 mg by mouth at bedtime.     lidocaine-prilocaine (EMLA) cream Apply to affected area once 30 g 3   Multiple Vitamin (MULTIVITAMIN WITH MINERALS) TABS tablet Take 1 tablet by mouth daily. Woman's 1 a day multivitamin     ondansetron (ZOFRAN) 8 MG tablet Take 1 tablet (8 mg total) by mouth 2 (two) times daily as needed (Nausea or vomiting). 30 tablet 1   oxyCODONE (OXY IR/ROXICODONE) 5 MG immediate release tablet Take 1 tablet (5 mg total) by mouth every 6 (six) hours as needed for  severe pain. 15 tablet 0   prochlorperazine (COMPAZINE) 10 MG tablet Take 1 tablet (10 mg total) by mouth every 6 (six) hours as needed (Nausea or vomiting). 30 tablet 1   No current facility-administered medications for this visit.    PHYSICAL EXAMINATION: ECOG PERFORMANCE STATUS: {CHL ONC ECOG [phone]}  There were no vitals filed for this visit. There were no vitals filed for this visit.  BREAST:*** No palpable masses or nodules in either right or  left breasts. No palpable axillary supraclavicular or infraclavicular adenopathy no breast tenderness or nipple discharge. (exam performed in the presence of a chaperone)  LABORATORY DATA:  I have reviewed the data as listed    Latest Ref Rng & Units 01/22/2022   11:07 AM 01/15/2022   10:42 AM 01/09/2022   10:17 AM  CMP  Glucose 70 - 99 mg/dL 94  92  97   BUN 8 - 23 mg/dL _0 Creatinine 0.44 - 1.00 mg/dL 0.67  0.67  0.71   Sodium 135 - 145 mmol/L 137  138  138   Potassium 3.5 - 5.1 mmol/L 4.3  4.2  4.1   Chloride 98 - 111 mmol/L 105  106  105   CO2 22 - 32 mmol/L _1 Calcium 8.9 - 10.3 mg/dL 9.3  9.2  9.2   Total Protein 6.5 - 8.1 g/dL 6.6  6.6  6.5   Total Bilirubin 0.3 - 1.2 mg/dL 0.8  0.8  0.7   Alkaline Phos 38 - 126 U/L 62  58  63   AST 15 - 41 U/L _2 ALT 0 - 44 U/L _3 Lab Results  Component Value Date   WBC 3.9 (L) 01/22/2022   HGB 11.3 (L) 01/22/2022   HCT 34.0 (L) 01/22/2022   MCV 95.0 01/22/2022   PLT 238 01/22/2022   NEUTROABS 2.1 01/22/2022    ASSESSMENT & PLAN:  No problem-specific Assessment & Plan notes found for this encounter.    No orders of the defined types were placed in this encounter.  The patient has a good understanding of the overall plan. she agrees with it. she will call with any problems that may develop before the next visit here. Total time spent: 30 mins including face to face time and time spent for planning, charting and co-ordination of care   Suzzette Righter, [place] 01/28/22    I Gardiner Coins am scribing for Dr. Lindi Adie  ***

## 2022-01-29 ENCOUNTER — Inpatient Hospital Stay: Payer: Medicare Other

## 2022-01-29 ENCOUNTER — Inpatient Hospital Stay (HOSPITAL_BASED_OUTPATIENT_CLINIC_OR_DEPARTMENT_OTHER): Payer: Medicare Other | Admitting: Hematology and Oncology

## 2022-01-29 ENCOUNTER — Other Ambulatory Visit: Payer: Self-pay

## 2022-01-29 DIAGNOSIS — Z95828 Presence of other vascular implants and grafts: Secondary | ICD-10-CM

## 2022-01-29 DIAGNOSIS — Z171 Estrogen receptor negative status [ER-]: Secondary | ICD-10-CM

## 2022-01-29 DIAGNOSIS — C50311 Malignant neoplasm of lower-inner quadrant of right female breast: Secondary | ICD-10-CM

## 2022-01-29 DIAGNOSIS — Z5111 Encounter for antineoplastic chemotherapy: Secondary | ICD-10-CM | POA: Diagnosis not present

## 2022-01-29 DIAGNOSIS — Z5112 Encounter for antineoplastic immunotherapy: Secondary | ICD-10-CM | POA: Diagnosis not present

## 2022-01-29 LAB — CBC WITH DIFFERENTIAL/PLATELET
Abs Immature Granulocytes: 0.03 10*3/uL (ref 0.00–0.07)
Basophils Absolute: 0 10*3/uL (ref 0.0–0.1)
Basophils Relative: 1 %
Eosinophils Absolute: 0.1 10*3/uL (ref 0.0–0.5)
Eosinophils Relative: 1 %
HCT: 32.2 % — ABNORMAL LOW (ref 36.0–46.0)
Hemoglobin: 10.8 g/dL — ABNORMAL LOW (ref 12.0–15.0)
Immature Granulocytes: 1 %
Lymphocytes Relative: 40 %
Lymphs Abs: 1.4 10*3/uL (ref 0.7–4.0)
MCH: 32 pg (ref 26.0–34.0)
MCHC: 33.5 g/dL (ref 30.0–36.0)
MCV: 95.3 fL (ref 80.0–100.0)
Monocytes Absolute: 0.3 10*3/uL (ref 0.1–1.0)
Monocytes Relative: 8 %
Neutro Abs: 1.7 10*3/uL (ref 1.7–7.7)
Neutrophils Relative %: 49 %
Platelets: 213 10*3/uL (ref 150–400)
RBC: 3.38 MIL/uL — ABNORMAL LOW (ref 3.87–5.11)
RDW: 13.4 % (ref 11.5–15.5)
WBC: 3.5 10*3/uL — ABNORMAL LOW (ref 4.0–10.5)
nRBC: 0 % (ref 0.0–0.2)

## 2022-01-29 LAB — COMPREHENSIVE METABOLIC PANEL
ALT: 18 U/L (ref 0–44)
AST: 22 U/L (ref 15–41)
Albumin: 4 g/dL (ref 3.5–5.0)
Alkaline Phosphatase: 61 U/L (ref 38–126)
Anion gap: 5 (ref 5–15)
BUN: 24 mg/dL — ABNORMAL HIGH (ref 8–23)
CO2: 28 mmol/L (ref 22–32)
Calcium: 9 mg/dL (ref 8.9–10.3)
Chloride: 106 mmol/L (ref 98–111)
Creatinine, Ser: 0.69 mg/dL (ref 0.44–1.00)
GFR, Estimated: >60 mL/min (ref >60)
Glucose, Bld: 95 mg/dL (ref 70–99)
Potassium: 4.3 mmol/L (ref 3.5–5.1)
Sodium: 139 mmol/L (ref 135–145)
Total Bilirubin: 1 mg/dL (ref 0.3–1.2)
Total Protein: 6.3 g/dL — ABNORMAL LOW (ref 6.5–8.1)

## 2022-01-29 MED ORDER — ACETAMINOPHEN 325 MG PO TABS
650.0000 mg | ORAL_TABLET | Freq: Once | ORAL | Status: AC
  Administered 2022-01-29: 650 mg via ORAL
  Filled 2022-01-29: qty 2

## 2022-01-29 MED ORDER — SODIUM CHLORIDE 0.9% FLUSH
10.0000 mL | INTRAVENOUS | Status: DC | PRN
  Administered 2022-01-29: 10 mL

## 2022-01-29 MED ORDER — DIPHENHYDRAMINE HCL 50 MG/ML IJ SOLN
12.5000 mg | Freq: Once | INTRAMUSCULAR | Status: AC
  Administered 2022-01-29: 12.5 mg via INTRAVENOUS
  Filled 2022-01-29: qty 1

## 2022-01-29 MED ORDER — SODIUM CHLORIDE 0.9% FLUSH
10.0000 mL | Freq: Once | INTRAVENOUS | Status: AC
  Administered 2022-01-29: 10 mL

## 2022-01-29 MED ORDER — SODIUM CHLORIDE 0.9 % IV SOLN: Freq: Once | INTRAVENOUS | Status: AC

## 2022-01-29 MED ORDER — SODIUM CHLORIDE 0.9 % IV SOLN
50.0000 mg/m2 | Freq: Once | INTRAVENOUS | Status: AC
  Administered 2022-01-29: 84 mg via INTRAVENOUS
  Filled 2022-01-29: qty 14

## 2022-01-29 MED ORDER — SODIUM CHLORIDE 0.9 % IV SOLN
10.0000 mg | Freq: Once | INTRAVENOUS | Status: AC
  Administered 2022-01-29: 10 mg via INTRAVENOUS
  Filled 2022-01-29: qty 10

## 2022-01-29 MED ORDER — TRASTUZUMAB-DKST CHEMO 150 MG IV SOLR
2.0000 mg/kg | Freq: Once | INTRAVENOUS | Status: AC
  Administered 2022-01-29: 126 mg via INTRAVENOUS
  Filled 2022-01-29: qty 6

## 2022-01-29 MED ORDER — LOPERAMIDE HCL 2 MG PO CAPS: 2.0000 mg | ORAL_CAPSULE | ORAL | 0 refills | Status: DC | PRN

## 2022-01-29 MED ORDER — HEPARIN SOD (PORK) LOCK FLUSH 100 UNIT/ML IV SOLN
500.0000 [IU] | Freq: Once | INTRAVENOUS | Status: AC | PRN
  Administered 2022-01-29: 500 [IU]

## 2022-01-29 MED ORDER — FAMOTIDINE IN NACL 20-0.9 MG/50ML-% IV SOLN
20.0000 mg | Freq: Once | INTRAVENOUS | Status: AC
  Administered 2022-01-29: 20 mg via INTRAVENOUS
  Filled 2022-01-29: qty 50

## 2022-01-29 NOTE — Assessment & Plan Note (Addendum)
11/28/2021:Palpable right breast lump 2.6 cm with calcifications at 3 o'clock position, additional 1.3 cm lesion is an intramammary lymph node biopsy: Benign, axilla negative, biopsy of the breast lump: Grade 3 IDC ER 0%, PR 0%, Ki-67 98%, HER2 3+ positive  Treatment plan: 1.Neoadjuvant chemotherapy with Taxol Herceptin weekly x12 followed by Herceptin maintenance 2.Breast conserving surgery with sentinel lymph node biopsy 3.Adjuvant radiation ----------------------------------------------------------------------------------------------------------------- Current Treatment: Cycle6Taxol-Herceptin ECHO 12/13/21: EF 60-65% Labs reviewed.  Chemotoxicities:  Diarrhea: Responded to Imodium Chemo-induced anemia: Monitoring closely Leukopenia: Stable   Monitoring Sydney Key blood counts. We gave Sydney Key a lower dosage of chemo and will keep the dosage the same.  Return to clinic weekly for Taxol Herceptin treatments and every other week for follow-up with me

## 2022-01-29 NOTE — Patient Instructions (Signed)
[facility]  Discharge Instructions: Thank you for choosing [facility] to provide your oncology and hematology care.   If you have a lab appointment with the [facility], please go directly to the [facility] and check in at the registration area.   Wear comfortable clothing and clothing appropriate for easy access to any Portacath or PICC line.   We strive to give you quality time with your provider. You may need to reschedule your appointment if you arrive late (15 or more minutes).  Arriving late affects you and other patients whose appointments are after yours.  Also, if you miss three or more appointments without notifying the office, you may be dismissed from the clinic at the provider's discretion.      For prescription refill requests, have your pharmacy contact our office and allow 72 hours for refills to be completed.    Today you received the following chemotherapy and/or immunotherapy agents: Trastuzumab and Paclitaxel      To help prevent nausea and vomiting after your treatment, we encourage you to take your nausea medication as directed.  BELOW ARE SYMPTOMS THAT SHOULD BE REPORTED IMMEDIATELY: *FEVER GREATER THAN 100.4 F (38 C) OR HIGHER *CHILLS OR SWEATING *NAUSEA AND VOMITING THAT IS NOT CONTROLLED WITH YOUR NAUSEA MEDICATION *UNUSUAL SHORTNESS OF BREATH *UNUSUAL BRUISING OR BLEEDING *URINARY PROBLEMS (pain or burning when urinating, or frequent urination) *BOWEL PROBLEMS (unusual diarrhea, constipation, pain near the anus) TENDERNESS IN MOUTH AND THROAT WITH OR WITHOUT PRESENCE OF ULCERS (sore throat, sores in mouth, or a toothache) UNUSUAL RASH, SWELLING OR PAIN  UNUSUAL VAGINAL DISCHARGE OR ITCHING   Items with * indicate a potential emergency and should be followed up as soon as possible or go to the Emergency Department if any problems should occur.  Please show the CHEMOTHERAPY ALERT CARD or IMMUNOTHERAPY ALERT  CARD at check-in to the Emergency Department and triage nurse.  Should you have questions after your visit or need to cancel or reschedule your appointment, please contact [facility]  Dept: [phone]  and follow the prompts.  Office hours are 8:00 a.m. to 4:30 p.m. Monday - Friday. Please note that voicemails left after 4:00 p.m. may not be returned until the following business day.  We are closed weekends and major holidays. You have access to a nurse at all times for urgent questions. Please call the main number to the clinic Dept: [phone] and follow the prompts.   For any non-urgent questions, you may also contact your provider using MyChart. We now offer e-Visits for anyone 50 and older to request care online for non-urgent symptoms. For details visit [email or web address].   Also download the MyChart app! Go to the app store, search "MyChart", open the app, select [facility], and log in with your MyChart username and password.  Masks are optional in the cancer centers. If you would like for your care team to wear a mask while they are taking care of you, please let them know. For doctor visits, patients may have with them one support person who is at least 86 years old. At this time, visitors are not allowed in the infusion area.

## 2022-01-30 ENCOUNTER — Other Ambulatory Visit: Payer: Self-pay

## 2022-02-04 MED FILL — Dexamethasone Sodium Phosphate Inj 100 MG/10ML: INTRAMUSCULAR | Qty: 1 | Status: AC

## 2022-02-04 NOTE — Progress Notes (Signed)
Patient Care Team: Raelene Bott, MD as PCP - General (Internal Medicine) Mauro Kaufmann, RN as Oncology Nurse Navigator Rockwell Germany, RN as Oncology Nurse Navigator Erroll Luna, MD as Consulting Physician (General Surgery) Nicholas Lose, MD as Consulting Physician (Hematology and Oncology) Eppie Gibson, MD as Attending Physician (Radiation Oncology)  DIAGNOSIS:  Encounter Diagnosis  Name Primary?   Malignant neoplasm of lower-inner quadrant of right breast of female, estrogen receptor negative ([facility])     SUMMARY OF ONCOLOGIC HISTORY: Oncology History  Malignant neoplasm of lower-inner quadrant of right breast of female, estrogen receptor negative ([facility])  11/28/2021 Initial Diagnosis   Palpable right breast lump 2.6 cm with calcifications at 3 o'clock position, additional 1.3 cm lesion is an intramammary lymph node biopsy: Benign, axilla negative, biopsy of the breast lump: Grade 3 IDC ER 0%, PR 0%, Ki-67 98%, HER2 3+ positive   12/05/2021 Cancer Staging   Staging form: Breast, AJCC 8th Edition - Clinical: Stage IIA (cT2, cN0, cM0, G3, ER-, PR-, HER2+) - Signed by Nicholas Lose, MD on 12/05/2021 Stage prefix: Initial diagnosis Histologic grading system: 3 grade system   12/26/2021 -  Chemotherapy   Patient is on Treatment Plan : BREAST Paclitaxel + Trastuzumab q7d / Trastuzumab q21d      Genetic Testing   [facility] CustomNext Panel was Negative. Report date is 12/18/2021.  The CustomNext-Cancer+RNAinsight panel offered by [facility] includes sequencing and rearrangement analysis for the following 47 genes:  APC, ATM, AXIN2, BARD1, BMPR1A, BRCA1, BRCA2, BRIP1, CDH1, CDK4, CDKN2A, CHEK2, CTNNA1, DICER1, EPCAM, GREM1, HOXB13, KIT, MEN1, MLH1, MSH2, MSH3, MSH6, MUTYH, NBN, NF1, NTHL1, PALB2, PDGFRA, PMS2, POLD1, POLE, PTEN, RAD50, RAD51C, RAD51D, SDHA, SDHB, SDHC, SDHD, SMAD4, SMARCA4, STK11, TP53, TSC1, TSC2, and VHL.  RNA data is routinely analyzed for use in variant  interpretation for all genes.     CHIEF COMPLIANT:   Follow-up on cycle 8 Taxol herceptin    INTERVAL HISTORY: Sydney Key is a 88 y.o. female is here because of recent diagnosis of right breast cancer. She presents to the clinic today for a follow-up. She denies diarrhea numbness in fingers and toes. She states that everything is going well. She had some concerns on how long would it takes for her to get over surgery.   ALLERGIES:  is allergic to paclitaxel.  MEDICATIONS:  Current Outpatient Medications  Medication Sig Dispense Refill   acetaminophen (TYLENOL) 500 MG tablet Take 500 mg by mouth at bedtime.     acetaminophen (TYLENOL) 650 MG CR tablet Take 650 mg by mouth every morning.     Ascorbic Acid (VITAMIN C WITH ROSE HIPS) 500 MG tablet Take 500 mg by mouth daily.     aspirin EC 81 MG tablet Take 81 mg by mouth every evening.     brimonidine (ALPHAGAN) 0.15 % ophthalmic solution Place 1 drop into the left eye 2 (two) times daily.     cholecalciferol (VITAMIN D) 1000 UNITS tablet Take 1,000 Units by mouth daily.     cyanocobalamin (,VITAMIN B-12,) 1000 MCG/ML injection Inject 1,000 mcg into the muscle every 30 (thirty) days.     escitalopram (LEXAPRO) 10 MG tablet Take 5 mg by mouth at bedtime.     gabapentin (NEURONTIN) 300 MG capsule Take 300 mg by mouth at bedtime.     lidocaine-prilocaine (EMLA) cream Apply to affected area once 30 g 3   loperamide (IMODIUM) 2 MG capsule Take 1 capsule (2 mg total) by mouth as needed for  diarrhea or loose stools. 30 capsule 0   Multiple Vitamin (MULTIVITAMIN WITH MINERALS) TABS tablet Take 1 tablet by mouth daily. Woman's 1 a day multivitamin     ondansetron (ZOFRAN) 8 MG tablet Take 1 tablet (8 mg total) by mouth 2 (two) times daily as needed (Nausea or vomiting). 30 tablet 1   prochlorperazine (COMPAZINE) 10 MG tablet Take 1 tablet (10 mg total) by mouth every 6 (six) hours as needed (Nausea or vomiting). 30 tablet 1   No current  facility-administered medications for this visit.    PHYSICAL EXAMINATION: ECOG PERFORMANCE STATUS: 1 - Symptomatic but completely ambulatory  Vitals:   02/12/22 1035  BP: 128/63  Pulse: 69  Resp: 12  Temp: (!) 97.2 F (36.2 C)  SpO2: 99%   Filed Weights   02/12/22 1035  Weight: 136 lb 9.6 oz (62 kg)    BREAST: Right breast palpable nodule freely mobile and it slightly smaller than before.  (exam performed in the presence of a chaperone)  LABORATORY DATA:  I have reviewed the data as listed    Latest Ref Rng & Units 02/12/2022   10:16 AM 02/05/2022   12:18 PM 01/29/2022   10:32 AM  CMP  Glucose 70 - 99 mg/dL 99  96  95   BUN 8 - 23 mg/dL 21  20  24    Creatinine 0.44 - 1.00 mg/dL 0.69  0.66  0.69   Sodium 135 - 145 mmol/L 138  138  139   Potassium 3.5 - 5.1 mmol/L 4.3  4.0  4.3   Chloride 98 - 111 mmol/L 106  105  106   CO2 22 - 32 mmol/L 29  29  28    Calcium 8.9 - 10.3 mg/dL 9.0  9.2  9.0   Total Protein 6.5 - 8.1 g/dL 6.5  6.8  6.3   Total Bilirubin 0.3 - 1.2 mg/dL 0.7  0.8  1.0   Alkaline Phos 38 - 126 U/L 66  61  61   AST 15 - 41 U/L 17  18  22    ALT 0 - 44 U/L 17  18  18      Lab Results  Component Value Date   WBC 3.9 (L) 02/12/2022   HGB 10.7 (L) 02/12/2022   HCT 31.8 (L) 02/12/2022   MCV 95.2 02/12/2022   PLT 224 02/12/2022   NEUTROABS 2.3 02/12/2022    ASSESSMENT & PLAN:  Malignant neoplasm of lower-inner quadrant of right breast of female, estrogen receptor negative ([facility]) 11/28/2021:Palpable right breast lump 2.6 cm with calcifications at 3 o'clock position, additional 1.3 cm lesion is an intramammary lymph node biopsy: Benign, axilla negative, biopsy of the breast lump: Grade 3 IDC ER 0%, PR 0%, Ki-67 98%, HER2 3+ positive   Treatment plan: 1.  Neoadjuvant chemotherapy with Taxol Herceptin weekly x12 followed by Herceptin maintenance 2. Breast conserving surgery with sentinel lymph node biopsy 3.  Adjuvant  radiation ----------------------------------------------------------------------------------------------------------------- Current Treatment: Cycle 8 Taxol-Herceptin ECHO 12/13/21: EF 60-65% Labs reviewed.   Chemotoxicities:  Diarrhea: No longer happening Chemo-induced anemia: Monitoring closely today's hemoglobin 11.4 Leukopenia: Stable     Monitoring her blood counts. We briefly discussed that type of surgery that she will be undergoing after chemo is done.  I believe that the tumor is shrinking but the type of surgery will depend on the final MRI.   Return to clinic weekly for Taxol Herceptin treatments and every other week for follow-up with me    No orders of  the defined types were placed in this encounter.  The patient has a good understanding of the overall plan. she agrees with it. she will call with any problems that may develop before the next visit here. Total time spent: 30 mins including face to face time and time spent for planning, charting and co-ordination of care   Harriette Ohara, MD 02/12/22    I Gardiner Coins am scribing for Dr. Lindi Adie  I have reviewed the above documentation for accuracy and completeness, and I agree with the above.

## 2022-02-05 ENCOUNTER — Inpatient Hospital Stay: Payer: Medicare Other | Attending: Hematology and Oncology

## 2022-02-05 ENCOUNTER — Other Ambulatory Visit: Payer: Self-pay

## 2022-02-05 ENCOUNTER — Encounter: Payer: Self-pay | Admitting: *Deleted

## 2022-02-05 ENCOUNTER — Inpatient Hospital Stay: Payer: Medicare Other

## 2022-02-05 VITALS — BP 102/52 | HR 66 | Temp 97.8°F | Resp 16 | Wt 137.0 lb

## 2022-02-05 DIAGNOSIS — C50311 Malignant neoplasm of lower-inner quadrant of right female breast: Secondary | ICD-10-CM

## 2022-02-05 DIAGNOSIS — Z95828 Presence of other vascular implants and grafts: Secondary | ICD-10-CM

## 2022-02-05 DIAGNOSIS — Z79899 Other long term (current) drug therapy: Secondary | ICD-10-CM | POA: Insufficient documentation

## 2022-02-05 DIAGNOSIS — Z5111 Encounter for antineoplastic chemotherapy: Secondary | ICD-10-CM | POA: Insufficient documentation

## 2022-02-05 DIAGNOSIS — Z5112 Encounter for antineoplastic immunotherapy: Secondary | ICD-10-CM | POA: Insufficient documentation

## 2022-02-05 LAB — CBC WITH DIFFERENTIAL/PLATELET
Abs Immature Granulocytes: 0.03 10*3/uL (ref 0.00–0.07)
Basophils Absolute: 0 10*3/uL (ref 0.0–0.1)
Basophils Relative: 1 %
Eosinophils Absolute: 0.1 10*3/uL (ref 0.0–0.5)
Eosinophils Relative: 2 %
HCT: 31.8 % — ABNORMAL LOW (ref 36.0–46.0)
Hemoglobin: 10.6 g/dL — ABNORMAL LOW (ref 12.0–15.0)
Immature Granulocytes: 1 %
Lymphocytes Relative: 33 %
Lymphs Abs: 1.3 10*3/uL (ref 0.7–4.0)
MCH: 31.7 pg (ref 26.0–34.0)
MCHC: 33.3 g/dL (ref 30.0–36.0)
MCV: 95.2 fL (ref 80.0–100.0)
Monocytes Absolute: 0.3 10*3/uL (ref 0.1–1.0)
Monocytes Relative: 8 %
Neutro Abs: 2.2 10*3/uL (ref 1.7–7.7)
Neutrophils Relative %: 55 %
Platelets: 228 10*3/uL (ref 150–400)
RBC: 3.34 MIL/uL — ABNORMAL LOW (ref 3.87–5.11)
RDW: 13.7 % (ref 11.5–15.5)
WBC: 4 10*3/uL (ref 4.0–10.5)
nRBC: 0 % (ref 0.0–0.2)

## 2022-02-05 LAB — COMPREHENSIVE METABOLIC PANEL
ALT: 18 U/L (ref 0–44)
AST: 18 U/L (ref 15–41)
Albumin: 4.1 g/dL (ref 3.5–5.0)
Alkaline Phosphatase: 61 U/L (ref 38–126)
Anion gap: 4 — ABNORMAL LOW (ref 5–15)
BUN: 20 mg/dL (ref 8–23)
CO2: 29 mmol/L (ref 22–32)
Calcium: 9.2 mg/dL (ref 8.9–10.3)
Chloride: 105 mmol/L (ref 98–111)
Creatinine, Ser: 0.66 mg/dL (ref 0.44–1.00)
GFR, Estimated: >60 mL/min (ref >60)
Glucose, Bld: 96 mg/dL (ref 70–99)
Potassium: 4 mmol/L (ref 3.5–5.1)
Sodium: 138 mmol/L (ref 135–145)
Total Bilirubin: 0.8 mg/dL (ref 0.3–1.2)
Total Protein: 6.8 g/dL (ref 6.5–8.1)

## 2022-02-05 MED ORDER — SODIUM CHLORIDE 0.9 % IV SOLN: Freq: Once | INTRAVENOUS | Status: AC

## 2022-02-05 MED ORDER — SODIUM CHLORIDE 0.9 % IV SOLN
10.0000 mg | Freq: Once | INTRAVENOUS | Status: AC
  Administered 2022-02-05: 10 mg via INTRAVENOUS
  Filled 2022-02-05: qty 10

## 2022-02-05 MED ORDER — SODIUM CHLORIDE 0.9% FLUSH
10.0000 mL | INTRAVENOUS | Status: DC | PRN
  Administered 2022-02-05: 10 mL

## 2022-02-05 MED ORDER — ACETAMINOPHEN 325 MG PO TABS
650.0000 mg | ORAL_TABLET | Freq: Once | ORAL | Status: AC
  Administered 2022-02-05: 650 mg via ORAL
  Filled 2022-02-05: qty 2

## 2022-02-05 MED ORDER — TRASTUZUMAB-DKST CHEMO 150 MG IV SOLR
2.0000 mg/kg | Freq: Once | INTRAVENOUS | Status: AC
  Administered 2022-02-05: 126 mg via INTRAVENOUS
  Filled 2022-02-05: qty 6

## 2022-02-05 MED ORDER — SODIUM CHLORIDE 0.9% FLUSH
10.0000 mL | Freq: Once | INTRAVENOUS | Status: AC
  Administered 2022-02-05: 10 mL

## 2022-02-05 MED ORDER — SODIUM CHLORIDE 0.9 % IV SOLN
50.0000 mg/m2 | Freq: Once | INTRAVENOUS | Status: AC
  Administered 2022-02-05: 84 mg via INTRAVENOUS
  Filled 2022-02-05: qty 14

## 2022-02-05 MED ORDER — DIPHENHYDRAMINE HCL 50 MG/ML IJ SOLN
12.5000 mg | Freq: Once | INTRAMUSCULAR | Status: AC
  Administered 2022-02-05: 12.5 mg via INTRAVENOUS
  Filled 2022-02-05: qty 1

## 2022-02-05 MED ORDER — FAMOTIDINE IN NACL 20-0.9 MG/50ML-% IV SOLN
20.0000 mg | Freq: Once | INTRAVENOUS | Status: AC
  Administered 2022-02-05: 20 mg via INTRAVENOUS
  Filled 2022-02-05: qty 50

## 2022-02-05 MED ORDER — HEPARIN SOD (PORK) LOCK FLUSH 100 UNIT/ML IV SOLN
500.0000 [IU] | Freq: Once | INTRAVENOUS | Status: AC | PRN
  Administered 2022-02-05: 500 [IU]

## 2022-02-05 NOTE — Patient Instructions (Signed)
[facility]   Discharge Instructions: Thank you for choosing [facility] to provide your oncology and hematology care.   If you have a lab appointment with the [facility], please go directly to the [facility] and check in at the registration area.   Wear comfortable clothing and clothing appropriate for easy access to any Portacath or PICC line.   We strive to give you quality time with your provider. You may need to reschedule your appointment if you arrive late (15 or more minutes).  Arriving late affects you and other patients whose appointments are after yours.  Also, if you miss three or more appointments without notifying the office, you may be dismissed from the clinic at the provider's discretion.      For prescription refill requests, have your pharmacy contact our office and allow 72 hours for refills to be completed.    Today you received the following chemotherapy and/or immunotherapy agents: trastuzumab-dkst and paclitaxel      To help prevent nausea and vomiting after your treatment, we encourage you to take your nausea medication as directed.  BELOW ARE SYMPTOMS THAT SHOULD BE REPORTED IMMEDIATELY: *FEVER GREATER THAN 100.4 F (38 C) OR HIGHER *CHILLS OR SWEATING *NAUSEA AND VOMITING THAT IS NOT CONTROLLED WITH YOUR NAUSEA MEDICATION *UNUSUAL SHORTNESS OF BREATH *UNUSUAL BRUISING OR BLEEDING *URINARY PROBLEMS (pain or burning when urinating, or frequent urination) *BOWEL PROBLEMS (unusual diarrhea, constipation, pain near the anus) TENDERNESS IN MOUTH AND THROAT WITH OR WITHOUT PRESENCE OF ULCERS (sore throat, sores in mouth, or a toothache) UNUSUAL RASH, SWELLING OR PAIN  UNUSUAL VAGINAL DISCHARGE OR ITCHING   Items with * indicate a potential emergency and should be followed up as soon as possible or go to the Emergency Department if any problems should occur.  Please show the CHEMOTHERAPY ALERT CARD or IMMUNOTHERAPY  ALERT CARD at check-in to the Emergency Department and triage nurse.  Should you have questions after your visit or need to cancel or reschedule your appointment, please contact [facility]  Dept: [phone]  and follow the prompts.  Office hours are 8:00 a.m. to 4:30 p.m. Monday - Friday. Please note that voicemails left after 4:00 p.m. may not be returned until the following business day.  We are closed weekends and major holidays. You have access to a nurse at all times for urgent questions. Please call the main number to the clinic Dept: [phone] and follow the prompts.   For any non-urgent questions, you may also contact your provider using MyChart. We now offer e-Visits for anyone 73 and older to request care online for non-urgent symptoms. For details visit [email or web address].   Also download the MyChart app! Go to the app store, search "MyChart", open the app, select [facility], and log in with your MyChart username and password.  Masks are optional in the cancer centers. If you would like for your care team to wear a mask while they are taking care of you, please let them know. You may have one support person who is at least 86 years old accompany you for your appointments.

## 2022-02-11 MED FILL — Dexamethasone Sodium Phosphate Inj 100 MG/10ML: INTRAMUSCULAR | Qty: 1 | Status: AC

## 2022-02-12 ENCOUNTER — Other Ambulatory Visit: Payer: Self-pay

## 2022-02-12 ENCOUNTER — Inpatient Hospital Stay: Payer: Medicare Other

## 2022-02-12 ENCOUNTER — Inpatient Hospital Stay: Payer: Medicare Other | Admitting: Hematology and Oncology

## 2022-02-12 DIAGNOSIS — Z171 Estrogen receptor negative status [ER-]: Secondary | ICD-10-CM

## 2022-02-12 DIAGNOSIS — Z79899 Other long term (current) drug therapy: Secondary | ICD-10-CM | POA: Diagnosis not present

## 2022-02-12 DIAGNOSIS — C50311 Malignant neoplasm of lower-inner quadrant of right female breast: Secondary | ICD-10-CM | POA: Diagnosis not present

## 2022-02-12 DIAGNOSIS — Z95828 Presence of other vascular implants and grafts: Secondary | ICD-10-CM

## 2022-02-12 DIAGNOSIS — Z5111 Encounter for antineoplastic chemotherapy: Secondary | ICD-10-CM | POA: Diagnosis not present

## 2022-02-12 DIAGNOSIS — Z5112 Encounter for antineoplastic immunotherapy: Secondary | ICD-10-CM | POA: Diagnosis not present

## 2022-02-12 LAB — COMPREHENSIVE METABOLIC PANEL
ALT: 17 U/L (ref 0–44)
AST: 17 U/L (ref 15–41)
Albumin: 3.9 g/dL (ref 3.5–5.0)
Alkaline Phosphatase: 66 U/L (ref 38–126)
Anion gap: 3 — ABNORMAL LOW (ref 5–15)
BUN: 21 mg/dL (ref 8–23)
CO2: 29 mmol/L (ref 22–32)
Calcium: 9 mg/dL (ref 8.9–10.3)
Chloride: 106 mmol/L (ref 98–111)
Creatinine, Ser: 0.69 mg/dL (ref 0.44–1.00)
GFR, Estimated: >60 mL/min (ref >60)
Glucose, Bld: 99 mg/dL (ref 70–99)
Potassium: 4.3 mmol/L (ref 3.5–5.1)
Sodium: 138 mmol/L (ref 135–145)
Total Bilirubin: 0.7 mg/dL (ref 0.3–1.2)
Total Protein: 6.5 g/dL (ref 6.5–8.1)

## 2022-02-12 LAB — CBC WITH DIFFERENTIAL/PLATELET
Abs Immature Granulocytes: 0.03 10*3/uL (ref 0.00–0.07)
Basophils Absolute: 0 10*3/uL (ref 0.0–0.1)
Basophils Relative: 1 %
Eosinophils Absolute: 0.1 10*3/uL (ref 0.0–0.5)
Eosinophils Relative: 1 %
HCT: 31.8 % — ABNORMAL LOW (ref 36.0–46.0)
Hemoglobin: 10.7 g/dL — ABNORMAL LOW (ref 12.0–15.0)
Immature Granulocytes: 1 %
Lymphocytes Relative: 31 %
Lymphs Abs: 1.2 10*3/uL (ref 0.7–4.0)
MCH: 32 pg (ref 26.0–34.0)
MCHC: 33.6 g/dL (ref 30.0–36.0)
MCV: 95.2 fL (ref 80.0–100.0)
Monocytes Absolute: 0.3 10*3/uL (ref 0.1–1.0)
Monocytes Relative: 8 %
Neutro Abs: 2.3 10*3/uL (ref 1.7–7.7)
Neutrophils Relative %: 58 %
Platelets: 224 10*3/uL (ref 150–400)
RBC: 3.34 MIL/uL — ABNORMAL LOW (ref 3.87–5.11)
RDW: 13.7 % (ref 11.5–15.5)
WBC: 3.9 10*3/uL — ABNORMAL LOW (ref 4.0–10.5)
nRBC: 0 % (ref 0.0–0.2)

## 2022-02-12 MED ORDER — FAMOTIDINE IN NACL 20-0.9 MG/50ML-% IV SOLN
20.0000 mg | Freq: Once | INTRAVENOUS | Status: AC
  Administered 2022-02-12: 20 mg via INTRAVENOUS
  Filled 2022-02-12: qty 50

## 2022-02-12 MED ORDER — HEPARIN SOD (PORK) LOCK FLUSH 100 UNIT/ML IV SOLN
500.0000 [IU] | Freq: Once | INTRAVENOUS | Status: AC | PRN
  Administered 2022-02-12: 500 [IU]

## 2022-02-12 MED ORDER — ACETAMINOPHEN 325 MG PO TABS
650.0000 mg | ORAL_TABLET | Freq: Once | ORAL | Status: AC
  Administered 2022-02-12: 650 mg via ORAL
  Filled 2022-02-12: qty 2

## 2022-02-12 MED ORDER — SODIUM CHLORIDE 0.9 % IV SOLN: Freq: Once | INTRAVENOUS | Status: AC

## 2022-02-12 MED ORDER — SODIUM CHLORIDE 0.9 % IV SOLN
50.0000 mg/m2 | Freq: Once | INTRAVENOUS | Status: AC
  Administered 2022-02-12: 84 mg via INTRAVENOUS
  Filled 2022-02-12: qty 14

## 2022-02-12 MED ORDER — SODIUM CHLORIDE 0.9% FLUSH
10.0000 mL | INTRAVENOUS | Status: DC | PRN
  Administered 2022-02-12: 10 mL

## 2022-02-12 MED ORDER — SODIUM CHLORIDE 0.9 % IV SOLN
10.0000 mg | Freq: Once | INTRAVENOUS | Status: AC
  Administered 2022-02-12: 10 mg via INTRAVENOUS
  Filled 2022-02-12: qty 10

## 2022-02-12 MED ORDER — DIPHENHYDRAMINE HCL 50 MG/ML IJ SOLN
12.5000 mg | Freq: Once | INTRAMUSCULAR | Status: AC
  Administered 2022-02-12: 12.5 mg via INTRAVENOUS
  Filled 2022-02-12: qty 1

## 2022-02-12 MED ORDER — SODIUM CHLORIDE 0.9% FLUSH
10.0000 mL | Freq: Once | INTRAVENOUS | Status: AC
  Administered 2022-02-12: 10 mL

## 2022-02-12 MED ORDER — TRASTUZUMAB-DKST CHEMO 150 MG IV SOLR
2.0000 mg/kg | Freq: Once | INTRAVENOUS | Status: AC
  Administered 2022-02-12: 126 mg via INTRAVENOUS
  Filled 2022-02-12: qty 6

## 2022-02-12 NOTE — Assessment & Plan Note (Addendum)
11/28/2021:Palpable right breast lump 2.6 cm with calcifications at 3 o'clock position, additional 1.3 cm lesion is an intramammary lymph node biopsy: Benign, axilla negative, biopsy of the breast lump: Grade 3 IDC ER 0%, PR 0%, Ki-67 98%, HER2 3+ positive  Treatment plan: 1.Neoadjuvant chemotherapy with Taxol Herceptin weekly x12 followed by Herceptin maintenance 2.Breast conserving surgery with sentinel lymph node biopsy 3.Adjuvant radiation ----------------------------------------------------------------------------------------------------------------- Current Treatment: Cycle8Taxol-Herceptin ECHO 12/13/21: EF 60-65% Labs reviewed.  Chemotoxicities:  Diarrhea: No longer happening Chemo-induced anemia: Monitoring closely today's hemoglobin 11.4 Leukopenia: Stable   Monitoring her blood counts. We briefly discussed that type of surgery that she will be undergoing after chemo is done.  I believe that the tumor is shrinking but the type of surgery will depend on the final MRI.  Return to clinic weekly for Taxol Herceptin treatments and every other week for follow-up with me

## 2022-02-12 NOTE — Patient Instructions (Signed)
[facility]   Discharge Instructions: Thank you for choosing [facility] to provide your oncology and hematology care.   If you have a lab appointment with the [facility], please go directly to the [facility] and check in at the registration area.   Wear comfortable clothing and clothing appropriate for easy access to any Portacath or PICC line.   We strive to give you quality time with your provider. You may need to reschedule your appointment if you arrive late (15 or more minutes).  Arriving late affects you and other patients whose appointments are after yours.  Also, if you miss three or more appointments without notifying the office, you may be dismissed from the clinic at the provider's discretion.      For prescription refill requests, have your pharmacy contact our office and allow 72 hours for refills to be completed.    Today you received the following chemotherapy and/or immunotherapy agents: trastuzumab-dkst and paclitaxel      To help prevent nausea and vomiting after your treatment, we encourage you to take your nausea medication as directed.  BELOW ARE SYMPTOMS THAT SHOULD BE REPORTED IMMEDIATELY: *FEVER GREATER THAN 100.4 F (38 C) OR HIGHER *CHILLS OR SWEATING *NAUSEA AND VOMITING THAT IS NOT CONTROLLED WITH YOUR NAUSEA MEDICATION *UNUSUAL SHORTNESS OF BREATH *UNUSUAL BRUISING OR BLEEDING *URINARY PROBLEMS (pain or burning when urinating, or frequent urination) *BOWEL PROBLEMS (unusual diarrhea, constipation, pain near the anus) TENDERNESS IN MOUTH AND THROAT WITH OR WITHOUT PRESENCE OF ULCERS (sore throat, sores in mouth, or a toothache) UNUSUAL RASH, SWELLING OR PAIN  UNUSUAL VAGINAL DISCHARGE OR ITCHING   Items with * indicate a potential emergency and should be followed up as soon as possible or go to the Emergency Department if any problems should occur.  Please show the CHEMOTHERAPY ALERT CARD or IMMUNOTHERAPY  ALERT CARD at check-in to the Emergency Department and triage nurse.  Should you have questions after your visit or need to cancel or reschedule your appointment, please contact [facility]  Dept: [phone]  and follow the prompts.  Office hours are 8:00 a.m. to 4:30 p.m. Monday - Friday. Please note that voicemails left after 4:00 p.m. may not be returned until the following business day.  We are closed weekends and major holidays. You have access to a nurse at all times for urgent questions. Please call the main number to the clinic Dept: [phone] and follow the prompts.   For any non-urgent questions, you may also contact your provider using MyChart. We now offer e-Visits for anyone 35 and older to request care online for non-urgent symptoms. For details visit [email or web address].   Also download the MyChart app! Go to the app store, search "MyChart", open the app, select [facility], and log in with your MyChart username and password.  Masks are optional in the cancer centers. If you would like for your care team to wear a mask while they are taking care of you, please let them know. You may have one support person who is at least 86 years old accompany you for your appointments.

## 2022-02-12 NOTE — Patient Instructions (Signed)

## 2022-02-13 DIAGNOSIS — E538 Deficiency of other specified B group vitamins: Secondary | ICD-10-CM | POA: Diagnosis not present

## 2022-02-18 MED FILL — Dexamethasone Sodium Phosphate Inj 100 MG/10ML: INTRAMUSCULAR | Qty: 1 | Status: AC

## 2022-02-19 ENCOUNTER — Inpatient Hospital Stay: Payer: Medicare Other

## 2022-02-19 ENCOUNTER — Other Ambulatory Visit: Payer: Self-pay

## 2022-02-19 VITALS — BP 136/83 | HR 68 | Temp 98.1°F | Resp 12 | Wt 136.8 lb

## 2022-02-19 DIAGNOSIS — Z171 Estrogen receptor negative status [ER-]: Secondary | ICD-10-CM

## 2022-02-19 DIAGNOSIS — Z5112 Encounter for antineoplastic immunotherapy: Secondary | ICD-10-CM | POA: Diagnosis not present

## 2022-02-19 DIAGNOSIS — Z79899 Other long term (current) drug therapy: Secondary | ICD-10-CM | POA: Diagnosis not present

## 2022-02-19 DIAGNOSIS — C50311 Malignant neoplasm of lower-inner quadrant of right female breast: Secondary | ICD-10-CM | POA: Diagnosis not present

## 2022-02-19 DIAGNOSIS — Z5111 Encounter for antineoplastic chemotherapy: Secondary | ICD-10-CM | POA: Diagnosis not present

## 2022-02-19 DIAGNOSIS — Z95828 Presence of other vascular implants and grafts: Secondary | ICD-10-CM

## 2022-02-19 LAB — CBC WITH DIFFERENTIAL/PLATELET
Abs Immature Granulocytes: 0.04 10*3/uL (ref 0.00–0.07)
Basophils Absolute: 0 10*3/uL (ref 0.0–0.1)
Basophils Relative: 1 %
Eosinophils Absolute: 0.1 10*3/uL (ref 0.0–0.5)
Eosinophils Relative: 1 %
HCT: 31.8 % — ABNORMAL LOW (ref 36.0–46.0)
Hemoglobin: 10.8 g/dL — ABNORMAL LOW (ref 12.0–15.0)
Immature Granulocytes: 1 %
Lymphocytes Relative: 34 %
Lymphs Abs: 1.4 10*3/uL (ref 0.7–4.0)
MCH: 32.5 pg (ref 26.0–34.0)
MCHC: 34 g/dL (ref 30.0–36.0)
MCV: 95.8 fL (ref 80.0–100.0)
Monocytes Absolute: 0.3 10*3/uL (ref 0.1–1.0)
Monocytes Relative: 8 %
Neutro Abs: 2.2 10*3/uL (ref 1.7–7.7)
Neutrophils Relative %: 55 %
Platelets: 224 10*3/uL (ref 150–400)
RBC: 3.32 MIL/uL — ABNORMAL LOW (ref 3.87–5.11)
RDW: 14.1 % (ref 11.5–15.5)
WBC: 4.1 10*3/uL (ref 4.0–10.5)
nRBC: 0 % (ref 0.0–0.2)

## 2022-02-19 LAB — COMPREHENSIVE METABOLIC PANEL
ALT: 22 U/L (ref 0–44)
AST: 20 U/L (ref 15–41)
Albumin: 3.7 g/dL (ref 3.5–5.0)
Alkaline Phosphatase: 60 U/L (ref 38–126)
Anion gap: 5 (ref 5–15)
BUN: 18 mg/dL (ref 8–23)
CO2: 26 mmol/L (ref 22–32)
Calcium: 9.3 mg/dL (ref 8.9–10.3)
Chloride: 107 mmol/L (ref 98–111)
Creatinine, Ser: 0.82 mg/dL (ref 0.44–1.00)
GFR, Estimated: >60 mL/min (ref >60)
Glucose, Bld: 97 mg/dL (ref 70–99)
Potassium: 3.8 mmol/L (ref 3.5–5.1)
Sodium: 138 mmol/L (ref 135–145)
Total Bilirubin: 0.8 mg/dL (ref 0.3–1.2)
Total Protein: 6.5 g/dL (ref 6.5–8.1)

## 2022-02-19 MED ORDER — HEPARIN SOD (PORK) LOCK FLUSH 100 UNIT/ML IV SOLN
500.0000 [IU] | Freq: Once | INTRAVENOUS | Status: AC | PRN
  Administered 2022-02-19: 500 [IU]

## 2022-02-19 MED ORDER — SODIUM CHLORIDE 0.9 % IV SOLN
50.0000 mg/m2 | Freq: Once | INTRAVENOUS | Status: AC
  Administered 2022-02-19: 84 mg via INTRAVENOUS
  Filled 2022-02-19: qty 14

## 2022-02-19 MED ORDER — TRASTUZUMAB-DKST CHEMO 150 MG IV SOLR
2.0000 mg/kg | Freq: Once | INTRAVENOUS | Status: AC
  Administered 2022-02-19: 126 mg via INTRAVENOUS
  Filled 2022-02-19: qty 6

## 2022-02-19 MED ORDER — FAMOTIDINE IN NACL 20-0.9 MG/50ML-% IV SOLN
20.0000 mg | Freq: Once | INTRAVENOUS | Status: AC
  Administered 2022-02-19: 20 mg via INTRAVENOUS

## 2022-02-19 MED ORDER — SODIUM CHLORIDE 0.9 % IV SOLN: Freq: Once | INTRAVENOUS | Status: AC

## 2022-02-19 MED ORDER — DIPHENHYDRAMINE HCL 50 MG/ML IJ SOLN
12.5000 mg | Freq: Once | INTRAMUSCULAR | Status: AC
  Administered 2022-02-19: 12.5 mg via INTRAVENOUS

## 2022-02-19 MED ORDER — SODIUM CHLORIDE 0.9% FLUSH
10.0000 mL | Freq: Once | INTRAVENOUS | Status: AC
  Administered 2022-02-19: 10 mL

## 2022-02-19 MED ORDER — ACETAMINOPHEN 325 MG PO TABS
650.0000 mg | ORAL_TABLET | Freq: Once | ORAL | Status: AC
  Administered 2022-02-19: 650 mg via ORAL

## 2022-02-19 MED ORDER — SODIUM CHLORIDE 0.9 % IV SOLN
10.0000 mg | Freq: Once | INTRAVENOUS | Status: AC
  Administered 2022-02-19: 10 mg via INTRAVENOUS
  Filled 2022-02-19: qty 10

## 2022-02-19 MED ORDER — ACETAMINOPHEN 325 MG PO TABS
ORAL_TABLET | ORAL | Status: AC
  Filled 2022-02-19: qty 1

## 2022-02-19 MED ORDER — SODIUM CHLORIDE 0.9% FLUSH
10.0000 mL | INTRAVENOUS | Status: DC | PRN
  Administered 2022-02-19: 10 mL

## 2022-02-19 NOTE — Patient Instructions (Signed)
[facility] MEDICAL [facility]COLOG[facility]  Discharge Instructions: Thank you for choosing [facility] to provide your oncology and hematology care.   If you have a lab appointment with the Cancer Center, please go directly to the Cancer Center and check in at the registration area.   Wear comfortable clothing and clothing appropriate for easy access to any Portacath or PICC line.   We strive to give you quality time with your provider. You may need to reschedule your appointment if you arrive late (15 or more minutes).  Arriving late affects you and other patients whose appointments are after yours.  Also, if you miss three or more appointments without notifying the office, you may be dismissed from the clinic at the provider's discretion.      For prescription refill requests, have your pharmacy contact our office and allow 72 hours for refills to be completed.    Today you received the following chemotherapy and/or immunotherapy agents: Ogivri/Taxol      To help prevent nausea and vomiting after your treatment, we encourage you to take your nausea medication as directed.  BELOW ARE SYMPTOMS THAT SHOULD BE REPORTED IMMEDIATELY: *FEVER GREATER THAN 100.4 F (38 C) OR HIGHER *CHILLS OR SWEATING *NAUSEA AND VOMITING THAT IS NOT CONTROLLED WITH YOUR NAUSEA MEDICATION *UNUSUAL SHORTNESS OF BREATH *UNUSUAL BRUISING OR BLEEDING *URINARY PROBLEMS (pain or burning when urinating, or frequent urination) *BOWEL PROBLEMS (unusual diarrhea, constipation, pain near the anus) TENDERNESS IN MOUTH AND THROAT WITH OR WITHOUT PRESENCE OF ULCERS (sore throat, sores in mouth, or a toothache) UNUSUAL RASH, SWELLING OR PAIN  UNUSUAL VAGINAL DISCHARGE OR ITCHING   Items with * indicate a potential emergency and should be followed up as soon as possible or go to the Emergency Department if any problems should occur.  Please show the CHEMOTHERAPY ALERT CARD or IMMUNOTHERAPY ALERT CARD at check-in  to the Emergency Department and triage nurse.  Should you have questions after your visit or need to cancel or reschedule your appointment, please contact [facility]  Dept: [phone]  and follow the prompts.  Office hours are 8:00 a.m. to 4:30 p.m. Monday - Friday. Please note that voicemails left after 4:00 p.m. may not be returned until the following business day.  We are closed weekends and major holidays. You have access to a nurse at all times for urgent questions. Please call the main number to the clinic Dept: [phone] and follow the prompts.   For any non-urgent questions, you may also contact your provider using MyChart. We now offer e-Visits for anyone 18 and older to request care online for non-urgent symptoms. For details visit [email or web address].   Also download the MyChart app! Go to the app store, search "MyChart", open the app, select [facility], and log in with your MyChart username and password.  Masks are optional in the cancer centers. If you would like for your care team to wear a mask while they are taking care of you, please let them know. You may have one support person who is at least 86 years old accompany you for your appointments. 

## 2022-02-24 NOTE — Progress Notes (Signed)
Patient Care Team: Raelene Bott, MD as PCP - General (Internal Medicine) Mauro Kaufmann, RN as Oncology Nurse Navigator Rockwell Germany, RN as Oncology Nurse Navigator Erroll Luna, MD as Consulting Physician (General Surgery) Nicholas Lose, MD as Consulting Physician (Hematology and Oncology) Eppie Gibson, MD as Attending Physician (Radiation Oncology)  DIAGNOSIS: No diagnosis found.  SUMMARY OF ONCOLOGIC HISTORY: Oncology History  Malignant neoplasm of lower-inner quadrant of right breast of female, estrogen receptor negative ([place])  11/28/2021 Initial Diagnosis   Palpable right breast lump 2.6 cm with calcifications at 3 o'clock position, additional 1.3 cm lesion is an intramammary lymph node biopsy: Benign, axilla negative, biopsy of the breast lump: Grade 3 IDC ER 0%, PR 0%, Ki-67 98%, HER2 3+ positive   12/05/2021 Cancer Staging   Staging form: Breast, AJCC 8th Edition - Clinical: Stage IIA (cT2, cN0, cM0, G3, ER-, PR-, HER2+) - Signed by Nicholas Lose, MD on 12/05/2021 Stage prefix: Initial diagnosis Histologic grading system: 3 grade system   12/26/2021 -  Chemotherapy   Patient is on Treatment Plan : BREAST Paclitaxel + Trastuzumab q7d / Trastuzumab q21d      Genetic Testing   [facility] CustomNext Panel was Negative. Report date is 12/18/2021.  The CustomNext-Cancer+RNAinsight panel offered by [employer] includes sequencing and rearrangement analysis for the following 47 genes:  APC, ATM, AXIN2, BARD1, BMPR1A, BRCA1, BRCA2, BRIP1, CDH1, CDK4, CDKN2A, CHEK2, CTNNA1, DICER1, EPCAM, GREM1, HOXB13, KIT, MEN1, MLH1, MSH2, MSH3, MSH6, MUTYH, NBN, NF1, NTHL1, PALB2, PDGFRA, PMS2, POLD1, POLE, PTEN, RAD50, RAD51C, RAD51D, SDHA, SDHB, SDHC, SDHD, SMAD4, SMARCA4, STK11, TP53, TSC1, TSC2, and VHL.  RNA data is routinely analyzed for use in variant interpretation for all genes.     CHIEF COMPLIANT: Follow-up on cycle 8 Taxol herceptin  INTERVAL HISTORY: Sydney Key is a 88 y.o.  female is here because of recent diagnosis of right breast cancer. She presents to the clinic today for a follow-up.     ALLERGIES:  is allergic to paclitaxel.  MEDICATIONS:  Current Outpatient Medications  Medication Sig Dispense Refill   acetaminophen (TYLENOL) 500 MG tablet Take 500 mg by mouth at bedtime.     acetaminophen (TYLENOL) 650 MG CR tablet Take 650 mg by mouth every morning.     Ascorbic Acid (VITAMIN C WITH ROSE HIPS) 500 MG tablet Take 500 mg by mouth daily.     aspirin EC 81 MG tablet Take 81 mg by mouth every evening.     brimonidine (ALPHAGAN) 0.15 % ophthalmic solution Place 1 drop into the left eye 2 (two) times daily.     cholecalciferol (VITAMIN D) 1000 UNITS tablet Take 1,000 Units by mouth daily.     cyanocobalamin (,VITAMIN B-12,) 1000 MCG/ML injection Inject 1,000 mcg into the muscle every 30 (thirty) days.     escitalopram (LEXAPRO) 10 MG tablet Take 5 mg by mouth at bedtime.     gabapentin (NEURONTIN) 300 MG capsule Take 300 mg by mouth at bedtime.     lidocaine-prilocaine (EMLA) cream Apply to affected area once 30 g 3   loperamide (IMODIUM) 2 MG capsule Take 1 capsule (2 mg total) by mouth as needed for diarrhea or loose stools. 30 capsule 0   Multiple Vitamin (MULTIVITAMIN WITH MINERALS) TABS tablet Take 1 tablet by mouth daily. Woman's 1 a day multivitamin     ondansetron (ZOFRAN) 8 MG tablet Take 1 tablet (8 mg total) by mouth 2 (two) times daily as needed (Nausea or vomiting). 30 tablet  1   prochlorperazine (COMPAZINE) 10 MG tablet Take 1 tablet (10 mg total) by mouth every 6 (six) hours as needed (Nausea or vomiting). 30 tablet 1   No current facility-administered medications for this visit.    PHYSICAL EXAMINATION: ECOG PERFORMANCE STATUS: {CHL ONC ECOG [phone]}  There were no vitals filed for this visit. There were no vitals filed for this visit.  BREAST:*** No palpable masses or nodules in either right or left breasts. No palpable axillary  supraclavicular or infraclavicular adenopathy no breast tenderness or nipple discharge. (exam performed in the presence of a chaperone)  LABORATORY DATA:  I have reviewed the data as listed    Latest Ref Rng & Units 02/19/2022   12:18 PM 02/12/2022   10:16 AM 02/05/2022   12:18 PM  CMP  Glucose 70 - 99 mg/dL 97  99  96   BUN 8 - 23 mg/dL 18  21  20    Creatinine 0.44 - 1.00 mg/dL 0.82  0.69  0.66   Sodium 135 - 145 mmol/L 138  138  138   Potassium 3.5 - 5.1 mmol/L 3.8  4.3  4.0   Chloride 98 - 111 mmol/L 107  106  105   CO2 22 - 32 mmol/L 26  29  29    Calcium 8.9 - 10.3 mg/dL 9.3  9.0  9.2   Total Protein 6.5 - 8.1 g/dL 6.5  6.5  6.8   Total Bilirubin 0.3 - 1.2 mg/dL 0.8  0.7  0.8   Alkaline Phos 38 - 126 U/L 60  66  61   AST 15 - 41 U/L 20  17  18    ALT 0 - 44 U/L 22  17  18      Lab Results  Component Value Date   WBC 4.1 02/19/2022   HGB 10.8 (L) 02/19/2022   HCT 31.8 (L) 02/19/2022   MCV 95.8 02/19/2022   PLT 224 02/19/2022   NEUTROABS 2.2 02/19/2022    ASSESSMENT & PLAN:  No problem-specific Assessment & Plan notes found for this encounter.    No orders of the defined types were placed in this encounter.  The patient has a good understanding of the overall plan. she agrees with it. she will call with any problems that may develop before the next visit here. Total time spent: 30 mins including face to face time and time spent for planning, charting and co-ordination of care   Suzzette Righter, [place] 02/24/22  I Gardiner Coins am scribing for Dr. Lindi Adie  ***

## 2022-02-25 DIAGNOSIS — H401123 Primary open-angle glaucoma, left eye, severe stage: Secondary | ICD-10-CM | POA: Diagnosis not present

## 2022-02-25 MED FILL — Dexamethasone Sodium Phosphate Inj 100 MG/10ML: INTRAMUSCULAR | Qty: 1 | Status: AC

## 2022-02-26 ENCOUNTER — Encounter: Payer: Self-pay | Admitting: *Deleted

## 2022-02-26 ENCOUNTER — Other Ambulatory Visit: Payer: Self-pay

## 2022-02-26 ENCOUNTER — Inpatient Hospital Stay: Payer: Medicare Other

## 2022-02-26 ENCOUNTER — Inpatient Hospital Stay: Payer: Medicare Other | Admitting: Hematology and Oncology

## 2022-02-26 VITALS — BP 126/59 | HR 68 | Temp 97.2°F | Resp 18 | Ht 65.5 in | Wt 137.1 lb

## 2022-02-26 DIAGNOSIS — Z5112 Encounter for antineoplastic immunotherapy: Secondary | ICD-10-CM | POA: Diagnosis not present

## 2022-02-26 DIAGNOSIS — C50311 Malignant neoplasm of lower-inner quadrant of right female breast: Secondary | ICD-10-CM | POA: Diagnosis not present

## 2022-02-26 DIAGNOSIS — Z79899 Other long term (current) drug therapy: Secondary | ICD-10-CM | POA: Diagnosis not present

## 2022-02-26 DIAGNOSIS — Z171 Estrogen receptor negative status [ER-]: Secondary | ICD-10-CM

## 2022-02-26 DIAGNOSIS — Z95828 Presence of other vascular implants and grafts: Secondary | ICD-10-CM

## 2022-02-26 DIAGNOSIS — Z5111 Encounter for antineoplastic chemotherapy: Secondary | ICD-10-CM | POA: Diagnosis not present

## 2022-02-26 LAB — CBC WITH DIFFERENTIAL/PLATELET
Abs Immature Granulocytes: 0.03 10*3/uL (ref 0.00–0.07)
Basophils Absolute: 0 10*3/uL (ref 0.0–0.1)
Basophils Relative: 1 %
Eosinophils Absolute: 0.1 10*3/uL (ref 0.0–0.5)
Eosinophils Relative: 1 %
HCT: 30.7 % — ABNORMAL LOW (ref 36.0–46.0)
Hemoglobin: 10.3 g/dL — ABNORMAL LOW (ref 12.0–15.0)
Immature Granulocytes: 1 %
Lymphocytes Relative: 32 %
Lymphs Abs: 1.1 10*3/uL (ref 0.7–4.0)
MCH: 32.2 pg (ref 26.0–34.0)
MCHC: 33.6 g/dL (ref 30.0–36.0)
MCV: 95.9 fL (ref 80.0–100.0)
Monocytes Absolute: 0.3 10*3/uL (ref 0.1–1.0)
Monocytes Relative: 8 %
Neutro Abs: 2.1 10*3/uL (ref 1.7–7.7)
Neutrophils Relative %: 57 %
Platelets: 223 10*3/uL (ref 150–400)
RBC: 3.2 MIL/uL — ABNORMAL LOW (ref 3.87–5.11)
RDW: 14.1 % (ref 11.5–15.5)
WBC: 3.6 10*3/uL — ABNORMAL LOW (ref 4.0–10.5)
nRBC: 0 % (ref 0.0–0.2)

## 2022-02-26 LAB — COMPREHENSIVE METABOLIC PANEL
ALT: 16 U/L (ref 0–44)
AST: 17 U/L (ref 15–41)
Albumin: 3.9 g/dL (ref 3.5–5.0)
Alkaline Phosphatase: 57 U/L (ref 38–126)
Anion gap: 1 — ABNORMAL LOW (ref 5–15)
BUN: 20 mg/dL (ref 8–23)
CO2: 29 mmol/L (ref 22–32)
Calcium: 9.3 mg/dL (ref 8.9–10.3)
Chloride: 107 mmol/L (ref 98–111)
Creatinine, Ser: 0.65 mg/dL (ref 0.44–1.00)
GFR, Estimated: >60 mL/min (ref >60)
Glucose, Bld: 95 mg/dL (ref 70–99)
Potassium: 4.1 mmol/L (ref 3.5–5.1)
Sodium: 137 mmol/L (ref 135–145)
Total Bilirubin: 0.8 mg/dL (ref 0.3–1.2)
Total Protein: 6.2 g/dL — ABNORMAL LOW (ref 6.5–8.1)

## 2022-02-26 MED ORDER — SODIUM CHLORIDE 0.9 % IV SOLN
10.0000 mg | Freq: Once | INTRAVENOUS | Status: AC
  Administered 2022-02-26: 10 mg via INTRAVENOUS
  Filled 2022-02-26: qty 10

## 2022-02-26 MED ORDER — DIPHENHYDRAMINE HCL 50 MG/ML IJ SOLN
12.5000 mg | Freq: Once | INTRAMUSCULAR | Status: AC
  Administered 2022-02-26: 12.5 mg via INTRAVENOUS
  Filled 2022-02-26: qty 1

## 2022-02-26 MED ORDER — TRASTUZUMAB-DKST CHEMO 150 MG IV SOLR
2.0000 mg/kg | Freq: Once | INTRAVENOUS | Status: AC
  Administered 2022-02-26: 126 mg via INTRAVENOUS
  Filled 2022-02-26: qty 6

## 2022-02-26 MED ORDER — HEPARIN SOD (PORK) LOCK FLUSH 100 UNIT/ML IV SOLN
500.0000 [IU] | Freq: Once | INTRAVENOUS | Status: AC | PRN
  Administered 2022-02-26: 500 [IU]

## 2022-02-26 MED ORDER — SODIUM CHLORIDE 0.9 % IV SOLN
50.0000 mg/m2 | Freq: Once | INTRAVENOUS | Status: AC
  Administered 2022-02-26: 84 mg via INTRAVENOUS
  Filled 2022-02-26: qty 14

## 2022-02-26 MED ORDER — SODIUM CHLORIDE 0.9% FLUSH
10.0000 mL | INTRAVENOUS | Status: DC | PRN
  Administered 2022-02-26: 10 mL

## 2022-02-26 MED ORDER — SODIUM CHLORIDE 0.9% FLUSH
10.0000 mL | Freq: Once | INTRAVENOUS | Status: AC
  Administered 2022-02-26: 10 mL

## 2022-02-26 MED ORDER — FAMOTIDINE IN NACL 20-0.9 MG/50ML-% IV SOLN
20.0000 mg | Freq: Once | INTRAVENOUS | Status: AC
  Administered 2022-02-26: 20 mg via INTRAVENOUS
  Filled 2022-02-26: qty 50

## 2022-02-26 MED ORDER — SODIUM CHLORIDE 0.9 % IV SOLN: Freq: Once | INTRAVENOUS | Status: AC

## 2022-02-26 MED ORDER — ACETAMINOPHEN 325 MG PO TABS
650.0000 mg | ORAL_TABLET | Freq: Once | ORAL | Status: AC
  Administered 2022-02-26: 650 mg via ORAL
  Filled 2022-02-26: qty 2

## 2022-02-26 NOTE — Assessment & Plan Note (Signed)
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  Chemotoxicities: Diarrhea: No longer happening Chemo-induced anemia: Monitoring closely today's hemoglobin 11.4 Leukopenia: Stable Monitoring her blood counts.  Return to clinic weekly for Taxol Herceptin treatments and every other week for follow-up with me

## 2022-02-26 NOTE — Patient Instructions (Signed)
[facility]OG[facility]  Discharge Instructions: Thank you for choosing [facility] to provide your oncology and hematology care.   If you have a lab appointment with the Cancer Center, please go directly to the Cancer Center and check in at the registration area.   Wear comfortable clothing and clothing appropriate for easy access to any Portacath or PICC line.   We strive to give you quality time with your provider. You may need to reschedule your appointment if you arrive late (15 or more minutes).  Arriving late affects you and other patients whose appointments are after yours.  Also, if you miss three or more appointments without notifying the office, you may be dismissed from the clinic at the provider's discretion.      For prescription refill requests, have your pharmacy contact our office and allow 72 hours for refills to be completed.    Today you received the following chemotherapy and/or immunotherapy agents: Ogivri/Taxol      To help prevent nausea and vomiting after your treatment, we encourage you to take your nausea medication as directed.  BELOW ARE SYMPTOMS THAT SHOULD BE REPORTED IMMEDIATELY: *FEVER GREATER THAN 100.4 F (38 C) OR HIGHER *CHILLS OR SWEATING *NAUSEA AND VOMITING THAT IS NOT CONTROLLED WITH YOUR NAUSEA MEDICATION *UNUSUAL SHORTNESS OF BREATH *UNUSUAL BRUISING OR BLEEDING *URINARY PROBLEMS (pain or burning when urinating, or frequent urination) *BOWEL PROBLEMS (unusual diarrhea, constipation, pain near the anus) TENDERNESS IN MOUTH AND THROAT WITH OR WITHOUT PRESENCE OF ULCERS (sore throat, sores in mouth, or a toothache) UNUSUAL RASH, SWELLING OR PAIN  UNUSUAL VAGINAL DISCHARGE OR ITCHING   Items with * indicate a potential emergency and should be followed up as soon as possible or go to the Emergency Department if any problems should occur.  Please show the CHEMOTHERAPY ALERT CARD or IMMUNOTHERAPY ALERT CARD at check-in  to the Emergency Department and triage nurse.  Should you have questions after your visit or need to cancel or reschedule your appointment, please contact [facility]  Dept: [phone]  and follow the prompts.  Office hours are 8:00 a.m. to 4:30 p.m. Monday - Friday. Please note that voicemails left after 4:00 p.m. may not be returned until the following business day.  We are closed weekends and major holidays. You have access to a nurse at all times for urgent questions. Please call the main number to the clinic Dept: [phone] and follow the prompts.   For any non-urgent questions, you may also contact your provider using MyChart. We now offer e-Visits for anyone 18 and older to request care online for non-urgent symptoms. For details visit [email or web address].   Also download the MyChart app! Go to the app store, search "MyChart", open the app, select [place], and log in with your MyChart username and password.  Masks are optional in the cancer centers. If you would like for your care team to wear a mask while they are taking care of you, please let them know. You may have one support person who is at least 86 years old accompany you for your appointments. 

## 2022-03-04 MED FILL — Dexamethasone Sodium Phosphate Inj 100 MG/10ML: INTRAMUSCULAR | Qty: 1 | Status: AC

## 2022-03-05 ENCOUNTER — Other Ambulatory Visit: Payer: Self-pay

## 2022-03-05 ENCOUNTER — Inpatient Hospital Stay: Payer: Medicare Other

## 2022-03-05 VITALS — BP 128/62 | HR 68 | Temp 98.2°F | Resp 18 | Wt 136.5 lb

## 2022-03-05 DIAGNOSIS — Z5112 Encounter for antineoplastic immunotherapy: Secondary | ICD-10-CM | POA: Diagnosis not present

## 2022-03-05 DIAGNOSIS — Z95828 Presence of other vascular implants and grafts: Secondary | ICD-10-CM

## 2022-03-05 DIAGNOSIS — Z171 Estrogen receptor negative status [ER-]: Secondary | ICD-10-CM

## 2022-03-05 DIAGNOSIS — Z5111 Encounter for antineoplastic chemotherapy: Secondary | ICD-10-CM | POA: Diagnosis not present

## 2022-03-05 DIAGNOSIS — C50311 Malignant neoplasm of lower-inner quadrant of right female breast: Secondary | ICD-10-CM | POA: Diagnosis not present

## 2022-03-05 DIAGNOSIS — Z79899 Other long term (current) drug therapy: Secondary | ICD-10-CM | POA: Diagnosis not present

## 2022-03-05 LAB — CBC WITH DIFFERENTIAL/PLATELET
Abs Immature Granulocytes: 0.04 10*3/uL (ref 0.00–0.07)
Basophils Absolute: 0 10*3/uL (ref 0.0–0.1)
Basophils Relative: 1 %
Eosinophils Absolute: 0.1 10*3/uL (ref 0.0–0.5)
Eosinophils Relative: 1 %
HCT: 32 % — ABNORMAL LOW (ref 36.0–46.0)
Hemoglobin: 10.7 g/dL — ABNORMAL LOW (ref 12.0–15.0)
Immature Granulocytes: 1 %
Lymphocytes Relative: 33 %
Lymphs Abs: 1.3 10*3/uL (ref 0.7–4.0)
MCH: 32 pg (ref 26.0–34.0)
MCHC: 33.4 g/dL (ref 30.0–36.0)
MCV: 95.8 fL (ref 80.0–100.0)
Monocytes Absolute: 0.3 10*3/uL (ref 0.1–1.0)
Monocytes Relative: 8 %
Neutro Abs: 2.2 10*3/uL (ref 1.7–7.7)
Neutrophils Relative %: 56 %
Platelets: 222 10*3/uL (ref 150–400)
RBC: 3.34 MIL/uL — ABNORMAL LOW (ref 3.87–5.11)
RDW: 14.2 % (ref 11.5–15.5)
WBC: 3.9 10*3/uL — ABNORMAL LOW (ref 4.0–10.5)
nRBC: 0 % (ref 0.0–0.2)

## 2022-03-05 LAB — COMPREHENSIVE METABOLIC PANEL
ALT: 17 U/L (ref 0–44)
AST: 18 U/L (ref 15–41)
Albumin: 4.1 g/dL (ref 3.5–5.0)
Alkaline Phosphatase: 63 U/L (ref 38–126)
Anion gap: 4 — ABNORMAL LOW (ref 5–15)
BUN: 19 mg/dL (ref 8–23)
CO2: 29 mmol/L (ref 22–32)
Calcium: 9.6 mg/dL (ref 8.9–10.3)
Chloride: 105 mmol/L (ref 98–111)
Creatinine, Ser: 0.56 mg/dL (ref 0.44–1.00)
GFR, Estimated: >60 mL/min (ref >60)
Glucose, Bld: 99 mg/dL (ref 70–99)
Potassium: 3.9 mmol/L (ref 3.5–5.1)
Sodium: 138 mmol/L (ref 135–145)
Total Bilirubin: 0.9 mg/dL (ref 0.3–1.2)
Total Protein: 6.5 g/dL (ref 6.5–8.1)

## 2022-03-05 MED ORDER — TRASTUZUMAB-DKST CHEMO 150 MG IV SOLR
2.0000 mg/kg | Freq: Once | INTRAVENOUS | Status: AC
  Administered 2022-03-05: 126 mg via INTRAVENOUS
  Filled 2022-03-05: qty 6

## 2022-03-05 MED ORDER — SODIUM CHLORIDE 0.9% FLUSH
10.0000 mL | Freq: Once | INTRAVENOUS | Status: AC
  Administered 2022-03-05: 10 mL

## 2022-03-05 MED ORDER — SODIUM CHLORIDE 0.9 % IV SOLN
10.0000 mg | Freq: Once | INTRAVENOUS | Status: AC
  Administered 2022-03-05: 10 mg via INTRAVENOUS
  Filled 2022-03-05: qty 10

## 2022-03-05 MED ORDER — SODIUM CHLORIDE 0.9 % IV SOLN: Freq: Once | INTRAVENOUS | Status: AC

## 2022-03-05 MED ORDER — DIPHENHYDRAMINE HCL 50 MG/ML IJ SOLN
12.5000 mg | Freq: Once | INTRAMUSCULAR | Status: AC
  Administered 2022-03-05: 12.5 mg via INTRAVENOUS
  Filled 2022-03-05: qty 1

## 2022-03-05 MED ORDER — ACETAMINOPHEN 325 MG PO TABS
650.0000 mg | ORAL_TABLET | Freq: Once | ORAL | Status: AC
  Administered 2022-03-05: 650 mg via ORAL
  Filled 2022-03-05: qty 2

## 2022-03-05 MED ORDER — SODIUM CHLORIDE 0.9% FLUSH
10.0000 mL | INTRAVENOUS | Status: DC | PRN
  Administered 2022-03-05: 10 mL

## 2022-03-05 MED ORDER — FAMOTIDINE IN NACL 20-0.9 MG/50ML-% IV SOLN
20.0000 mg | Freq: Once | INTRAVENOUS | Status: AC
  Administered 2022-03-05: 20 mg via INTRAVENOUS
  Filled 2022-03-05: qty 50

## 2022-03-05 MED ORDER — HEPARIN SOD (PORK) LOCK FLUSH 100 UNIT/ML IV SOLN
500.0000 [IU] | Freq: Once | INTRAVENOUS | Status: AC | PRN
  Administered 2022-03-05: 500 [IU]

## 2022-03-05 MED ORDER — SODIUM CHLORIDE 0.9 % IV SOLN
50.0000 mg/m2 | Freq: Once | INTRAVENOUS | Status: AC
  Administered 2022-03-05: 84 mg via INTRAVENOUS
  Filled 2022-03-05: qty 14

## 2022-03-05 NOTE — Patient Instructions (Signed)
[facility]  Discharge Instructions: Thank you for choosing [facility] to provide your oncology and hematology care.   If you have a lab appointment with the Cancer Center, please go directly to the Cancer Center and check in at the registration area.   Wear comfortable clothing and clothing appropriate for easy access to any Portacath or PICC line.   We strive to give you quality time with your provider. You may need to reschedule your appointment if you arrive late (15 or more minutes).  Arriving late affects you and other patients whose appointments are after yours.  Also, if you miss three or more appointments without notifying the office, you may be dismissed from the clinic at the provider's discretion.      For prescription refill requests, have your pharmacy contact our office and allow 72 hours for refills to be completed.    Today you received the following chemotherapy and/or immunotherapy agents: Ogivri/Taxol      To help prevent nausea and vomiting after your treatment, we encourage you to take your nausea medication as directed.  BELOW ARE SYMPTOMS THAT SHOULD BE REPORTED IMMEDIATELY: *FEVER GREATER THAN 100.4 F (38 C) OR HIGHER *CHILLS OR SWEATING *NAUSEA AND VOMITING THAT IS NOT CONTROLLED WITH YOUR NAUSEA MEDICATION *UNUSUAL SHORTNESS OF BREATH *UNUSUAL BRUISING OR BLEEDING *URINARY PROBLEMS (pain or burning when urinating, or frequent urination) *BOWEL PROBLEMS (unusual diarrhea, constipation, pain near the anus) TENDERNESS IN MOUTH AND THROAT WITH OR WITHOUT PRESENCE OF ULCERS (sore throat, sores in mouth, or a toothache) UNUSUAL RASH, SWELLING OR PAIN  UNUSUAL VAGINAL DISCHARGE OR ITCHING   Items with * indicate a potential emergency and should be followed up as soon as possible or go to the Emergency Department if any problems should occur.  Please show the CHEMOTHERAPY ALERT CARD or IMMUNOTHERAPY ALERT CARD at check-in  to the Emergency Department and triage nurse.  Should you have questions after your visit or need to cancel or reschedule your appointment, please contact [facility]  Dept: [phone]  and follow the prompts.  Office hours are 8:00 a.m. to 4:30 p.m. Monday - Friday. Please note that voicemails left after 4:00 p.m. may not be returned until the following business day.  We are closed weekends and major holidays. You have access to a nurse at all times for urgent questions. Please call the main number to the clinic Dept: [phone] and follow the prompts.   For any non-urgent questions, you may also contact your provider using MyChart. We now offer e-Visits for anyone 18 and older to request care online for non-urgent symptoms. For details visit [email or web address].   Also download the MyChart app! Go to the app store, search "MyChart", open the app, select [facility], and log in with your MyChart username and password.  Masks are optional in the cancer centers. If you would like for your care team to wear a mask while they are taking care of you, please let them know. You may have one support person who is at least 86 years old accompany you for your appointments. 

## 2022-03-08 ENCOUNTER — Other Ambulatory Visit: Payer: Self-pay

## 2022-03-08 NOTE — Progress Notes (Unsigned)
Patient Care Team: Raelene Bott, MD as PCP - General (Internal Medicine) Mauro Kaufmann, RN as Oncology Nurse Navigator Rockwell Germany, RN as Oncology Nurse Navigator Erroll Luna, MD as Consulting Physician (General Surgery) Nicholas Lose, MD as Consulting Physician (Hematology and Oncology) Eppie Gibson, MD as Attending Physician (Radiation Oncology)  DIAGNOSIS: No diagnosis found.  SUMMARY OF ONCOLOGIC HISTORY: Oncology History  Malignant neoplasm of lower-inner quadrant of right breast of female, estrogen receptor negative ([place])  11/28/2021 Initial Diagnosis   Palpable right breast lump 2.6 cm with calcifications at 3 o'clock position, additional 1.3 cm lesion is an intramammary lymph node biopsy: Benign, axilla negative, biopsy of the breast lump: Grade 3 IDC ER 0%, PR 0%, Ki-67 98%, HER2 3+ positive   12/05/2021 Cancer Staging   Staging form: Breast, AJCC 8th Edition - Clinical: Stage IIA (cT2, cN0, cM0, G3, ER-, PR-, HER2+) - Signed by Nicholas Lose, MD on 12/05/2021 Stage prefix: Initial diagnosis Histologic grading system: 3 grade system   12/26/2021 -  Chemotherapy   Patient is on Treatment Plan : BREAST Paclitaxel + Trastuzumab q7d / Trastuzumab q21d      Genetic Testing   [facility] CustomNext Panel was Negative. Report date is 12/18/2021.  The CustomNext-Cancer+RNAinsight panel offered by [facility] includes sequencing and rearrangement analysis for the following 47 genes:  APC, ATM, AXIN2, BARD1, BMPR1A, BRCA1, BRCA2, BRIP1, CDH1, CDK4, CDKN2A, CHEK2, CTNNA1, DICER1, EPCAM, GREM1, HOXB13, KIT, MEN1, MLH1, MSH2, MSH3, MSH6, MUTYH, NBN, NF1, NTHL1, PALB2, PDGFRA, PMS2, POLD1, POLE, PTEN, RAD50, RAD51C, RAD51D, SDHA, SDHB, SDHC, SDHD, SMAD4, SMARCA4, STK11, TP53, TSC1, TSC2, and VHL.  RNA data is routinely analyzed for use in variant interpretation for all genes.     CHIEF COMPLIANT:   INTERVAL HISTORY: Sydney Key is a   ALLERGIES:  is allergic to  paclitaxel.  MEDICATIONS:  Current Outpatient Medications  Medication Sig Dispense Refill   acetaminophen (TYLENOL) 500 MG tablet Take 500 mg by mouth at bedtime.     acetaminophen (TYLENOL) 650 MG CR tablet Take 650 mg by mouth every morning.     Ascorbic Acid (VITAMIN C WITH ROSE HIPS) 500 MG tablet Take 500 mg by mouth daily.     aspirin EC 81 MG tablet Take 81 mg by mouth every evening.     brimonidine (ALPHAGAN) 0.15 % ophthalmic solution Place 1 drop into the left eye 2 (two) times daily.     cholecalciferol (VITAMIN D) 1000 UNITS tablet Take 1,000 Units by mouth daily.     cyanocobalamin (,VITAMIN B-12,) 1000 MCG/ML injection Inject 1,000 mcg into the muscle every 30 (thirty) days.     escitalopram (LEXAPRO) 10 MG tablet Take 5 mg by mouth at bedtime.     gabapentin (NEURONTIN) 300 MG capsule Take 300 mg by mouth at bedtime.     lidocaine-prilocaine (EMLA) cream Apply to affected area once 30 g 3   loperamide (IMODIUM) 2 MG capsule Take 1 capsule (2 mg total) by mouth as needed for diarrhea or loose stools. 30 capsule 0   Multiple Vitamin (MULTIVITAMIN WITH MINERALS) TABS tablet Take 1 tablet by mouth daily. Woman's 1 a day multivitamin     mupirocin ointment (BACTROBAN) 2 % Apply topically daily.     ondansetron (ZOFRAN) 8 MG tablet Take 1 tablet (8 mg total) by mouth 2 (two) times daily as needed (Nausea or vomiting). 30 tablet 1   prochlorperazine (COMPAZINE) 10 MG tablet Take 1 tablet (10 mg total) by mouth every  6 (six) hours as needed (Nausea or vomiting). 30 tablet 1   No current facility-administered medications for this visit.    PHYSICAL EXAMINATION: ECOG PERFORMANCE STATUS: {CHL ONC ECOG PS:[phone]}  There were no vitals filed for this visit. There were no vitals filed for this visit.  BREAST:*** No palpable masses or nodules in either right or left breasts. No palpable axillary supraclavicular or infraclavicular adenopathy no breast tenderness or nipple discharge.  (exam performed in the presence of a chaperone)  LABORATORY DATA:  I have reviewed the data as listed    Latest Ref Rng & Units 03/05/2022   12:18 PM 02/26/2022   11:05 AM 02/19/2022   12:18 PM  CMP  Glucose 70 - 99 mg/dL 99  95  97   BUN 8 - 23 mg/dL 19  20  18    Creatinine 0.44 - 1.00 mg/dL 0.56  0.65  0.82   Sodium 135 - 145 mmol/L 138  137  138   Potassium 3.5 - 5.1 mmol/L 3.9  4.1  3.8   Chloride 98 - 111 mmol/L 105  107  107   CO2 22 - 32 mmol/L 29  29  26    Calcium 8.9 - 10.3 mg/dL 9.6  9.3  9.3   Total Protein 6.5 - 8.1 g/dL 6.5  6.2  6.5   Total Bilirubin 0.3 - 1.2 mg/dL 0.9  0.8  0.8   Alkaline Phos 38 - 126 U/L 63  57  60   AST 15 - 41 U/L 18  17  20    ALT 0 - 44 U/L 17  16  22      Lab Results  Component Value Date   WBC 3.9 (L) 03/05/2022   HGB 10.7 (L) 03/05/2022   HCT 32.0 (L) 03/05/2022   MCV 95.8 03/05/2022   PLT 222 03/05/2022   NEUTROABS 2.2 03/05/2022    ASSESSMENT & PLAN:  No problem-specific Assessment & Plan notes found for this encounter.    No orders of the defined types were placed in this encounter.  The patient has a good understanding of the overall plan. she agrees with it. she will call with any problems that may develop before the next visit here. Total time spent: 30 mins including face to face time and time spent for planning, charting and co-ordination of care   Suzzette Righter, [place] 03/08/22

## 2022-03-09 ENCOUNTER — Other Ambulatory Visit: Payer: Self-pay | Admitting: Hematology and Oncology

## 2022-03-12 ENCOUNTER — Inpatient Hospital Stay: Payer: Medicare Other | Attending: Hematology and Oncology | Admitting: Hematology and Oncology

## 2022-03-12 ENCOUNTER — Inpatient Hospital Stay: Payer: Medicare Other

## 2022-03-12 ENCOUNTER — Other Ambulatory Visit: Payer: Self-pay

## 2022-03-12 ENCOUNTER — Encounter: Payer: Self-pay | Admitting: *Deleted

## 2022-03-12 VITALS — BP 136/67 | HR 73 | Resp 16

## 2022-03-12 DIAGNOSIS — Z5112 Encounter for antineoplastic immunotherapy: Secondary | ICD-10-CM | POA: Diagnosis not present

## 2022-03-12 DIAGNOSIS — Z171 Estrogen receptor negative status [ER-]: Secondary | ICD-10-CM | POA: Insufficient documentation

## 2022-03-12 DIAGNOSIS — C50311 Malignant neoplasm of lower-inner quadrant of right female breast: Secondary | ICD-10-CM

## 2022-03-12 DIAGNOSIS — Z803 Family history of malignant neoplasm of breast: Secondary | ICD-10-CM | POA: Diagnosis not present

## 2022-03-12 DIAGNOSIS — Z79899 Other long term (current) drug therapy: Secondary | ICD-10-CM | POA: Diagnosis not present

## 2022-03-12 DIAGNOSIS — Z9011 Acquired absence of right breast and nipple: Secondary | ICD-10-CM | POA: Insufficient documentation

## 2022-03-12 DIAGNOSIS — Z95828 Presence of other vascular implants and grafts: Secondary | ICD-10-CM

## 2022-03-12 DIAGNOSIS — Z5111 Encounter for antineoplastic chemotherapy: Secondary | ICD-10-CM | POA: Diagnosis not present

## 2022-03-12 LAB — CBC WITH DIFFERENTIAL/PLATELET
Abs Immature Granulocytes: 0.04 10*3/uL (ref 0.00–0.07)
Basophils Absolute: 0 10*3/uL (ref 0.0–0.1)
Basophils Relative: 1 %
Eosinophils Absolute: 0.1 10*3/uL (ref 0.0–0.5)
Eosinophils Relative: 1 %
HCT: 31.2 % — ABNORMAL LOW (ref 36.0–46.0)
Hemoglobin: 10.6 g/dL — ABNORMAL LOW (ref 12.0–15.0)
Immature Granulocytes: 1 %
Lymphocytes Relative: 32 %
Lymphs Abs: 1.3 10*3/uL (ref 0.7–4.0)
MCH: 32.5 pg (ref 26.0–34.0)
MCHC: 34 g/dL (ref 30.0–36.0)
MCV: 95.7 fL (ref 80.0–100.0)
Monocytes Absolute: 0.4 10*3/uL (ref 0.1–1.0)
Monocytes Relative: 9 %
Neutro Abs: 2.3 10*3/uL (ref 1.7–7.7)
Neutrophils Relative %: 56 %
Platelets: 228 10*3/uL (ref 150–400)
RBC: 3.26 MIL/uL — ABNORMAL LOW (ref 3.87–5.11)
RDW: 14.3 % (ref 11.5–15.5)
WBC: 4.1 10*3/uL (ref 4.0–10.5)
nRBC: 0 % (ref 0.0–0.2)

## 2022-03-12 LAB — COMPREHENSIVE METABOLIC PANEL
ALT: 16 U/L (ref 0–44)
AST: 17 U/L (ref 15–41)
Albumin: 4 g/dL (ref 3.5–5.0)
Alkaline Phosphatase: 63 U/L (ref 38–126)
Anion gap: 4 — ABNORMAL LOW (ref 5–15)
BUN: 19 mg/dL (ref 8–23)
CO2: 29 mmol/L (ref 22–32)
Calcium: 9.5 mg/dL (ref 8.9–10.3)
Chloride: 104 mmol/L (ref 98–111)
Creatinine, Ser: 0.71 mg/dL (ref 0.44–1.00)
GFR, Estimated: >60 mL/min (ref >60)
Glucose, Bld: 95 mg/dL (ref 70–99)
Potassium: 4.4 mmol/L (ref 3.5–5.1)
Sodium: 137 mmol/L (ref 135–145)
Total Bilirubin: 0.8 mg/dL (ref 0.3–1.2)
Total Protein: 6.4 g/dL — ABNORMAL LOW (ref 6.5–8.1)

## 2022-03-12 MED ORDER — SODIUM CHLORIDE 0.9 % IV SOLN: Freq: Once | INTRAVENOUS | Status: AC

## 2022-03-12 MED ORDER — DIPHENHYDRAMINE HCL 50 MG/ML IJ SOLN
12.5000 mg | Freq: Once | INTRAMUSCULAR | Status: AC
  Administered 2022-03-12: 12.5 mg via INTRAVENOUS
  Filled 2022-03-12: qty 1

## 2022-03-12 MED ORDER — SODIUM CHLORIDE 0.9 % IV SOLN
50.0000 mg/m2 | Freq: Once | INTRAVENOUS | Status: AC
  Administered 2022-03-12: 84 mg via INTRAVENOUS
  Filled 2022-03-12: qty 14

## 2022-03-12 MED ORDER — TRASTUZUMAB-DKST CHEMO 150 MG IV SOLR
2.0000 mg/kg | Freq: Once | INTRAVENOUS | Status: AC
  Administered 2022-03-12: 126 mg via INTRAVENOUS
  Filled 2022-03-12: qty 6

## 2022-03-12 MED ORDER — SODIUM CHLORIDE 0.9% FLUSH
10.0000 mL | Freq: Once | INTRAVENOUS | Status: AC
  Administered 2022-03-12: 10 mL

## 2022-03-12 MED ORDER — ACETAMINOPHEN 325 MG PO TABS
650.0000 mg | ORAL_TABLET | Freq: Once | ORAL | Status: AC
  Administered 2022-03-12: 650 mg via ORAL
  Filled 2022-03-12: qty 2

## 2022-03-12 MED ORDER — SODIUM CHLORIDE 0.9 % IV SOLN
10.0000 mg | Freq: Once | INTRAVENOUS | Status: AC
  Administered 2022-03-12: 10 mg via INTRAVENOUS
  Filled 2022-03-12: qty 10

## 2022-03-12 MED ORDER — FAMOTIDINE IN NACL 20-0.9 MG/50ML-% IV SOLN
20.0000 mg | Freq: Once | INTRAVENOUS | Status: AC
  Administered 2022-03-12: 20 mg via INTRAVENOUS
  Filled 2022-03-12: qty 50

## 2022-03-12 MED ORDER — HEPARIN SOD (PORK) LOCK FLUSH 100 UNIT/ML IV SOLN
500.0000 [IU] | Freq: Once | INTRAVENOUS | Status: AC | PRN
  Administered 2022-03-12: 500 [IU]

## 2022-03-12 MED ORDER — SODIUM CHLORIDE 0.9% FLUSH
10.0000 mL | INTRAVENOUS | Status: DC | PRN
  Administered 2022-03-12: 10 mL

## 2022-03-12 NOTE — Patient Instructions (Signed)
[facility]  Discharge Instructions: Thank you for choosing [facility] to provide your oncology and hematology care.   If you have a lab appointment with the [facility], please go directly to the [facility] and check in at the registration area.   Wear comfortable clothing and clothing appropriate for easy access to any Portacath or PICC line.   We strive to give you quality time with your provider. You may need to reschedule your appointment if you arrive late (15 or more minutes).  Arriving late affects you and other patients whose appointments are after yours.  Also, if you miss three or more appointments without notifying the office, you may be dismissed from the clinic at the provider's discretion.      For prescription refill requests, have your pharmacy contact our office and allow 72 hours for refills to be completed.    Today you received the following chemotherapy and/or immunotherapy agents trastuzumab, paclitaxel      To help prevent nausea and vomiting after your treatment, we encourage you to take your nausea medication as directed.  BELOW ARE SYMPTOMS THAT SHOULD BE REPORTED IMMEDIATELY: *FEVER GREATER THAN 100.4 F (38 C) OR HIGHER *CHILLS OR SWEATING *NAUSEA AND VOMITING THAT IS NOT CONTROLLED WITH YOUR NAUSEA MEDICATION *UNUSUAL SHORTNESS OF BREATH *UNUSUAL BRUISING OR BLEEDING *URINARY PROBLEMS (pain or burning when urinating, or frequent urination) *BOWEL PROBLEMS (unusual diarrhea, constipation, pain near the anus) TENDERNESS IN MOUTH AND THROAT WITH OR WITHOUT PRESENCE OF ULCERS (sore throat, sores in mouth, or a toothache) UNUSUAL RASH, SWELLING OR PAIN  UNUSUAL VAGINAL DISCHARGE OR ITCHING   Items with * indicate a potential emergency and should be followed up as soon as possible or go to the Emergency Department if any problems should occur.  Please show the CHEMOTHERAPY ALERT CARD or IMMUNOTHERAPY ALERT CARD at  check-in to the Emergency Department and triage nurse.  Should you have questions after your visit or need to cancel or reschedule your appointment, please contact [facility]  Dept: [phone]  and follow the prompts.  Office hours are 8:00 a.m. to 4:30 p.m. Monday - Friday. Please note that voicemails left after 4:00 p.m. may not be returned until the following business day.  We are closed weekends and major holidays. You have access to a nurse at all times for urgent questions. Please call the main number to the clinic Dept: [phone] and follow the prompts.   For any non-urgent questions, you may also contact your provider using MyChart. We now offer e-Visits for anyone 66 and older to request care online for non-urgent symptoms. For details visit [email or web address].   Also download the MyChart app! Go to the app store, search "MyChart", open the app, select [facility], and log in with your MyChart username and password.  Masks are optional in the cancer centers. If you would like for your care team to wear a mask while they are taking care of you, please let them know. You may have one support person who is at least 86 years old accompany you for your appointments.

## 2022-03-12 NOTE — Assessment & Plan Note (Signed)
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  Chemotoxicities: Diarrhea:No longer happening Chemo-induced anemia: Monitoring closelytoday's hemoglobin 10.3 Leukopenia: Stable Monitoring her blood counts.  Return to clinic weekly for Taxol Herceptin treatments and every other week for follow-up with me

## 2022-03-13 ENCOUNTER — Ambulatory Visit
Admission: RE | Admit: 2022-03-13 | Discharge: 2022-03-13 | Disposition: A | Discharge status: Home / Self Care | Payer: Medicare Other | Source: Ambulatory Visit | Attending: Hematology and Oncology | Admitting: Hematology and Oncology

## 2022-03-13 DIAGNOSIS — N6310 Unspecified lump in the right breast, unspecified quadrant: Secondary | ICD-10-CM | POA: Diagnosis not present

## 2022-03-13 DIAGNOSIS — Z171 Estrogen receptor negative status [ER-]: Secondary | ICD-10-CM

## 2022-03-13 MED ORDER — GADOPICLENOL 0.5 MMOL/ML IV SOLN
6.0000 mL | Freq: Once | INTRAVENOUS | Status: AC | PRN
  Administered 2022-03-13: 6 mL via INTRAVENOUS

## 2022-03-14 ENCOUNTER — Encounter: Payer: Self-pay | Admitting: *Deleted

## 2022-03-14 ENCOUNTER — Other Ambulatory Visit: Payer: Self-pay | Admitting: Hematology and Oncology

## 2022-03-14 DIAGNOSIS — C50311 Malignant neoplasm of lower-inner quadrant of right female breast: Secondary | ICD-10-CM

## 2022-03-15 ENCOUNTER — Telehealth: Payer: Self-pay | Admitting: Hematology and Oncology

## 2022-03-15 NOTE — Telephone Encounter (Signed)
Scheduled appointment per WQ. Left voicemail. 

## 2022-03-16 ENCOUNTER — Other Ambulatory Visit: Payer: Self-pay

## 2022-03-18 ENCOUNTER — Ambulatory Visit: Payer: Self-pay | Admitting: Surgery

## 2022-03-18 DIAGNOSIS — C50311 Malignant neoplasm of lower-inner quadrant of right female breast: Secondary | ICD-10-CM | POA: Diagnosis not present

## 2022-03-18 DIAGNOSIS — Z171 Estrogen receptor negative status [ER-]: Secondary | ICD-10-CM | POA: Diagnosis not present

## 2022-03-19 ENCOUNTER — Inpatient Hospital Stay: Payer: Medicare Other

## 2022-03-19 ENCOUNTER — Encounter: Payer: Self-pay | Admitting: *Deleted

## 2022-03-21 NOTE — Pre-Procedure Instructions (Signed)
Surgical Instructions    Your procedure is scheduled on Wednesday, September 20th.  Report to [facility] Main Entrance "A" at 11:00 A.M., then check in with the Admitting office.  Call this number if you have problems the morning of surgery:  [phone]   If you have any questions prior to your surgery date call [phone]: Open Monday-Friday 8am-4pm    Remember:  Do not eat after midnight the night before your surgery  You may drink clear liquids until 10:00 AM the morning of your surgery.   Clear liquids allowed are: Water, Non-Citrus Juices (without pulp), Carbonated Beverages, Clear Tea, Black Coffee Only (NO MILK, CREAM OR POWDERED CREAMER of any kind), and Gatorade.   Patient Instructions  The night before surgery:  No food after midnight. ONLY clear liquids after midnight  The day of surgery (if you do NOT have diabetes):  Drink ONE (1) Pre-Surgery Clear Ensure by 10:00 AM the morning of surgery. Drink in one sitting. Do not sip.  This drink was given to you during your hospital  pre-op appointment visit.  Nothing else to drink after completing the  Pre-Surgery Clear Ensure.          If you have questions, please contact your surgeon's office.     Take these medicines the morning of surgery: brimonidine (ALPHAGAN) 0.15 % ophthalmic solution   Follow your surgeon's instructions on when to stop Aspirin.  If no instructions were given by your surgeon then you will need to call the office to get those instructions.     As of today, STOP taking any Aleve, Naproxen, Ibuprofen, Motrin, Advil, Goody's, BC's, all herbal medications, fish oil, and all vitamins.                     Do NOT Smoke (Tobacco/Vaping) for 24 hours prior to your procedure.  If you use a CPAP at night, you may bring your mask/headgear for your overnight stay.   Contacts, glasses, piercing's, hearing aid's, dentures or partials may not be worn into surgery, please bring cases for these  belongings.    For patients admitted to the hospital, discharge time will be determined by your treatment team.   Patients discharged the day of surgery will not be allowed to drive home, and someone needs to stay with them for 24 hours.  SURGICAL WAITING ROOM VISITATION Patients having surgery or a procedure may have no more than 2 support people in the waiting area - these visitors may rotate.   Children under the age of 14 must have an adult with them who is not the patient. If the patient needs to stay at the hospital during part of their recovery, the visitor guidelines for inpatient rooms apply. Pre-op nurse will coordinate an appropriate time for 1 support person to accompany patient in pre-op.  This support person may not rotate.   Please refer to the [facility] website for the visitor guidelines for Inpatients (after your surgery is over and you are in a regular room).    Special instructions:   [facility]- Preparing For Surgery  Before surgery, you can play an important role. Because skin is not sterile, your skin needs to be as free of germs as possible. You can reduce the number of germs on your skin by washing with CHG (chlorahexidine gluconate) Soap before surgery.  CHG is an antiseptic cleaner which kills germs and bonds with the skin to continue killing germs even after washing.  Oral Hygiene is also important to reduce your risk of infection.  Remember - BRUSH YOUR TEETH THE MORNING OF SURGERY WITH YOUR REGULAR TOOTHPASTE  Please do not use if you have an allergy to CHG or antibacterial soaps. If your skin becomes reddened/irritated stop using the CHG.  Do not shave (including legs and underarms) for at least 48 hours prior to first CHG shower. It is OK to shave your face.  Please follow these instructions carefully.   Shower the NIGHT BEFORE SURGERY and the MORNING OF SURGERY  If you chose to wash your hair, wash your hair first as usual with your normal  shampoo.  After you shampoo, rinse your hair and body thoroughly to remove the shampoo.  Use CHG Soap as you would any other liquid soap. You can apply CHG directly to the skin and wash gently with a scrungie or a clean washcloth.   Apply the CHG Soap to your body ONLY FROM THE NECK DOWN.  Do not use on open wounds or open sores. Avoid contact with your eyes, ears, mouth and genitals (private parts). Wash Face and genitals (private parts)  with your normal soap.   Wash thoroughly, paying special attention to the area where your surgery will be performed.  Thoroughly rinse your body with warm water from the neck down.  DO NOT shower/wash with your normal soap after using and rinsing off the CHG Soap.  Pat yourself dry with a CLEAN TOWEL.  Wear CLEAN PAJAMAS to bed the night before surgery  Place CLEAN SHEETS on your bed the night before your surgery  DO NOT SLEEP WITH PETS.   Day of Surgery: Take a shower with CHG soap. Do not wear jewelry or makeup Do not wear lotions, powders, perfumes, or deodorant. Do not shave 48 hours prior to surgery.   Do not bring valuables to the hospital. [facility] is not responsible for any belongings or valuables. Do not wear nail polish, gel polish, artificial nails, or any other type of covering on natural nails (fingers and toes) If you have artificial nails or gel coating that need to be removed by a nail salon, please have this removed prior to surgery. Artificial nails or gel coating may interfere with anesthesia's ability to adequately monitor your vital signs. Wear Clean/Comfortable clothing the morning of surgery Remember to brush your teeth WITH YOUR REGULAR TOOTHPASTE.   Please read over the following fact sheets that you were given.    If you received a COVID test during your pre-op visit  it is requested that you wear a mask when out in public, stay away from anyone that may not be feeling well and notify your surgeon if you develop  symptoms. If you have been in contact with anyone that has tested positive in the last 10 days please notify you surgeon.

## 2022-03-22 ENCOUNTER — Other Ambulatory Visit: Payer: Self-pay

## 2022-03-22 ENCOUNTER — Encounter: Payer: Self-pay | Admitting: Hematology and Oncology

## 2022-03-22 ENCOUNTER — Encounter (HOSPITAL_COMMUNITY)
Admission: RE | Admit: 2022-03-22 | Discharge: 2022-03-22 | Disposition: A | Discharge status: Home / Self Care | Payer: Medicare Other | Source: Ambulatory Visit | Attending: Surgery | Admitting: Surgery

## 2022-03-22 ENCOUNTER — Encounter (HOSPITAL_COMMUNITY): Payer: Self-pay

## 2022-03-22 VITALS — BP 130/53 | HR 74 | Temp 97.5°F | Resp 17 | Ht 65.0 in | Wt 137.8 lb

## 2022-03-22 DIAGNOSIS — Z01818 Encounter for other preprocedural examination: Secondary | ICD-10-CM | POA: Insufficient documentation

## 2022-03-22 LAB — CBC
HCT: 33.7 % — ABNORMAL LOW (ref 36.0–46.0)
Hemoglobin: 11.1 g/dL — ABNORMAL LOW (ref 12.0–15.0)
MCH: 32.6 pg (ref 26.0–34.0)
MCHC: 32.9 g/dL (ref 30.0–36.0)
MCV: 98.8 fL (ref 80.0–100.0)
Platelets: 250 10*3/uL (ref 150–400)
RBC: 3.41 MIL/uL — ABNORMAL LOW (ref 3.87–5.11)
RDW: 14 % (ref 11.5–15.5)
WBC: 4 10*3/uL (ref 4.0–10.5)
nRBC: 0 % (ref 0.0–0.2)

## 2022-03-22 NOTE — Progress Notes (Signed)
PCP - Dr. Raelene Bott Cardiologist - denies  PPM/ICD - denies   Chest x-ray - 01/04/22 EKG - 06/06/20 Stress Test - denies ECHO - 12/13/21 Cardiac Cath - denies  Sleep Study - sleep study about 5 yrs ago per pt, OSA- CPAP - n/a  DM- denies  Blood Thinner Instructions: n/a Aspirin Instructions: f/u with surgeon for instructions  ERAS Protcol - yes PRE-SURGERY Ensure given at PAT  COVID TEST- n/a   Anesthesia review: no  Patient denies shortness of breath, fever, cough and chest pain at PAT appointment   All instructions explained to the patient, with a verbal understanding of the material. Patient agrees to go over the instructions while at home for a better understanding. The opportunity to ask questions was provided.

## 2022-03-25 NOTE — Progress Notes (Signed)
Patient called asking about medications. Pt has not had aspirin or vitamins since 03/22/22. Pt asking if she can still take gabapentin, Lexapro and tylenol. Pt states that she takes these three medications at night. Pt informed that she can still take these three medications. Pt had no further questions. Patient instructed to call back if she had any other questions. Pt verbalized understanding.

## 2022-03-27 ENCOUNTER — Encounter (HOSPITAL_COMMUNITY): Payer: Self-pay | Admitting: Surgery

## 2022-03-27 ENCOUNTER — Other Ambulatory Visit: Payer: Self-pay

## 2022-03-27 ENCOUNTER — Ambulatory Visit (HOSPITAL_COMMUNITY): Payer: Medicare Other | Admitting: Certified Registered"

## 2022-03-27 ENCOUNTER — Ambulatory Visit (HOSPITAL_BASED_OUTPATIENT_CLINIC_OR_DEPARTMENT_OTHER): Payer: Medicare Other | Admitting: Certified Registered"

## 2022-03-27 ENCOUNTER — Encounter (HOSPITAL_COMMUNITY)
Admission: RE | Disposition: A | Discharge status: Home / Self Care | Payer: Self-pay | Source: Home / Self Care | Attending: Surgery

## 2022-03-27 ENCOUNTER — Observation Stay (HOSPITAL_COMMUNITY)
Admission: RE | Admit: 2022-03-27 | Discharge: 2022-03-28 | Disposition: A | Discharge status: Home / Self Care | Payer: Medicare Other | Attending: Surgery | Admitting: Surgery

## 2022-03-27 DIAGNOSIS — C50911 Malignant neoplasm of unspecified site of right female breast: Secondary | ICD-10-CM | POA: Diagnosis present

## 2022-03-27 DIAGNOSIS — Z7982 Long term (current) use of aspirin: Secondary | ICD-10-CM | POA: Diagnosis not present

## 2022-03-27 DIAGNOSIS — Z79899 Other long term (current) drug therapy: Secondary | ICD-10-CM | POA: Insufficient documentation

## 2022-03-27 DIAGNOSIS — C50311 Malignant neoplasm of lower-inner quadrant of right female breast: Secondary | ICD-10-CM | POA: Diagnosis not present

## 2022-03-27 DIAGNOSIS — G8918 Other acute postprocedural pain: Secondary | ICD-10-CM | POA: Diagnosis not present

## 2022-03-27 HISTORY — PX: SIMPLE MASTECTOMY WITH AXILLARY SENTINEL NODE BIOPSY: SHX6098

## 2022-03-27 SURGERY — SIMPLE MASTECTOMY WITH AXILLARY SENTINEL NODE BIOPSY
Anesthesia: Regional | Site: Breast | Laterality: Right

## 2022-03-27 MED ORDER — PHENYLEPHRINE HCL-NACL 20-0.9 MG/250ML-% IV SOLN
INTRAVENOUS | Status: DC | PRN
  Administered 2022-03-27: 30 ug/min via INTRAVENOUS

## 2022-03-27 MED ORDER — SODIUM CHLORIDE (PF) 0.9 % IJ SOLN
INTRAVENOUS | Status: DC | PRN
  Administered 2022-03-27: 5 mL

## 2022-03-27 MED ORDER — PHENYLEPHRINE 80 MCG/ML (10ML) SYRINGE FOR IV PUSH (FOR BLOOD PRESSURE SUPPORT)
PREFILLED_SYRINGE | INTRAVENOUS | Status: DC | PRN
  Administered 2022-03-27: 160 ug via INTRAVENOUS
  Administered 2022-03-27: 80 ug via INTRAVENOUS
  Administered 2022-03-27: 160 ug via INTRAVENOUS

## 2022-03-27 MED ORDER — LIDOCAINE 2% (20 MG/ML) 5 ML SYRINGE
INTRAMUSCULAR | Status: DC | PRN
  Administered 2022-03-27: 20 mg via INTRAVENOUS

## 2022-03-27 MED ORDER — OXYCODONE HCL 5 MG PO TABS
5.0000 mg | ORAL_TABLET | ORAL | Status: DC | PRN
  Administered 2022-03-28: 5 mg via ORAL
  Filled 2022-03-27: qty 1
  Filled 2022-03-27: qty 2
  Filled 2022-03-27: qty 1

## 2022-03-27 MED ORDER — 0.9 % SODIUM CHLORIDE (POUR BTL) OPTIME
TOPICAL | Status: DC | PRN
  Administered 2022-03-27: 1000 mL

## 2022-03-27 MED ORDER — MAGTRACE LYMPHATIC TRACER
INTRAMUSCULAR | Status: DC | PRN
  Administered 2022-03-27: 2 mL via INTRAMUSCULAR

## 2022-03-27 MED ORDER — BRIMONIDINE TARTRATE 0.15 % OP SOLN
1.0000 [drp] | Freq: Two times a day (BID) | OPHTHALMIC | Status: DC
  Administered 2022-03-27 – 2022-03-28 (×2): 1 [drp] via OPHTHALMIC
  Filled 2022-03-27: qty 5

## 2022-03-27 MED ORDER — LIDOCAINE 2% (20 MG/ML) 5 ML SYRINGE
INTRAMUSCULAR | Status: AC
  Filled 2022-03-27: qty 5

## 2022-03-27 MED ORDER — CHLORHEXIDINE GLUCONATE CLOTH 2 % EX PADS: 6.0000 | MEDICATED_PAD | Freq: Once | CUTANEOUS | Status: DC

## 2022-03-27 MED ORDER — OXYCODONE HCL 5 MG/5ML PO SOLN: 5.0000 mg | Freq: Once | ORAL | Status: DC | PRN

## 2022-03-27 MED ORDER — FENTANYL CITRATE (PF) 250 MCG/5ML IJ SOLN
INTRAMUSCULAR | Status: DC | PRN
  Administered 2022-03-27: 25 ug via INTRAVENOUS
  Administered 2022-03-27: 50 ug via INTRAVENOUS
  Administered 2022-03-27: 25 ug via INTRAVENOUS
  Administered 2022-03-27: 50 ug via INTRAVENOUS

## 2022-03-27 MED ORDER — LACTATED RINGERS IV SOLN: INTRAVENOUS | Status: DC

## 2022-03-27 MED ORDER — BUPIVACAINE LIPOSOME 1.3 % IJ SUSP
INTRAMUSCULAR | Status: DC | PRN
  Administered 2022-03-27: 10 mL via PERINEURAL

## 2022-03-27 MED ORDER — TRAMADOL HCL 50 MG PO TABS
50.0000 mg | ORAL_TABLET | Freq: Four times a day (QID) | ORAL | Status: DC | PRN
  Administered 2022-03-27: 50 mg via ORAL
  Filled 2022-03-27: qty 1

## 2022-03-27 MED ORDER — PROPOFOL 10 MG/ML IV BOLUS
INTRAVENOUS | Status: DC | PRN
  Administered 2022-03-27: 100 mg via INTRAVENOUS
  Administered 2022-03-27: 30 mg via INTRAVENOUS

## 2022-03-27 MED ORDER — PROPOFOL 10 MG/ML IV BOLUS
INTRAVENOUS | Status: AC
  Filled 2022-03-27: qty 20

## 2022-03-27 MED ORDER — STERILE WATER FOR IRRIGATION IR SOLN
Status: DC | PRN
  Administered 2022-03-27: 1000 mL

## 2022-03-27 MED ORDER — CEFAZOLIN SODIUM-DEXTROSE 2-4 GM/100ML-% IV SOLN
2.0000 g | INTRAVENOUS | Status: AC
  Administered 2022-03-27: 2 g via INTRAVENOUS
  Filled 2022-03-27: qty 100

## 2022-03-27 MED ORDER — METOPROLOL TARTRATE 5 MG/5ML IV SOLN: 5.0000 mg | Freq: Four times a day (QID) | INTRAVENOUS | Status: DC | PRN

## 2022-03-27 MED ORDER — CHLORHEXIDINE GLUCONATE 0.12 % MT SOLN
15.0000 mL | Freq: Once | OROMUCOSAL | Status: AC
  Administered 2022-03-27: 15 mL via OROMUCOSAL
  Filled 2022-03-27: qty 15

## 2022-03-27 MED ORDER — METHOCARBAMOL 500 MG PO TABS: 500.0000 mg | ORAL_TABLET | Freq: Four times a day (QID) | ORAL | Status: DC | PRN

## 2022-03-27 MED ORDER — ONDANSETRON HCL 4 MG/2ML IJ SOLN: 4.0000 mg | Freq: Once | INTRAMUSCULAR | Status: DC | PRN

## 2022-03-27 MED ORDER — FENTANYL CITRATE (PF) 100 MCG/2ML IJ SOLN: 25.0000 ug | INTRAMUSCULAR | Status: DC | PRN

## 2022-03-27 MED ORDER — GABAPENTIN 300 MG PO CAPS
300.0000 mg | ORAL_CAPSULE | Freq: Every day | ORAL | Status: DC
  Administered 2022-03-27: 300 mg via ORAL
  Filled 2022-03-27: qty 1

## 2022-03-27 MED ORDER — METHYLENE BLUE 1 % INJ SOLN
INTRAVENOUS | Status: AC
  Filled 2022-03-27: qty 10

## 2022-03-27 MED ORDER — MORPHINE SULFATE (PF) 2 MG/ML IV SOLN: 1.0000 mg | INTRAVENOUS | Status: DC | PRN

## 2022-03-27 MED ORDER — DEXTROSE-NACL 5-0.9 % IV SOLN: INTRAVENOUS | Status: DC

## 2022-03-27 MED ORDER — ONDANSETRON 4 MG PO TBDP: 4.0000 mg | ORAL_TABLET | Freq: Four times a day (QID) | ORAL | Status: DC | PRN

## 2022-03-27 MED ORDER — ORAL CARE MOUTH RINSE: 15.0000 mL | Freq: Once | OROMUCOSAL | Status: AC

## 2022-03-27 MED ORDER — EPHEDRINE SULFATE-NACL 50-0.9 MG/10ML-% IV SOSY
PREFILLED_SYRINGE | INTRAVENOUS | Status: DC | PRN
  Administered 2022-03-27 (×2): 5 mg via INTRAVENOUS

## 2022-03-27 MED ORDER — ONDANSETRON HCL 4 MG/2ML IJ SOLN
INTRAMUSCULAR | Status: AC
  Filled 2022-03-27: qty 2

## 2022-03-27 MED ORDER — ENOXAPARIN SODIUM 40 MG/0.4ML IJ SOSY
40.0000 mg | PREFILLED_SYRINGE | INTRAMUSCULAR | Status: DC
  Administered 2022-03-28: 40 mg via SUBCUTANEOUS
  Filled 2022-03-27: qty 0.4

## 2022-03-27 MED ORDER — ONDANSETRON HCL 4 MG/2ML IJ SOLN
4.0000 mg | Freq: Four times a day (QID) | INTRAMUSCULAR | Status: DC | PRN
  Filled 2022-03-27: qty 2

## 2022-03-27 MED ORDER — FENTANYL CITRATE (PF) 250 MCG/5ML IJ SOLN
INTRAMUSCULAR | Status: AC
  Filled 2022-03-27: qty 5

## 2022-03-27 MED ORDER — AMISULPRIDE (ANTIEMETIC) 5 MG/2ML IV SOLN: 10.0000 mg | Freq: Once | INTRAVENOUS | Status: DC | PRN

## 2022-03-27 MED ORDER — ROPIVACAINE HCL 5 MG/ML IJ SOLN
INTRAMUSCULAR | Status: DC | PRN
  Administered 2022-03-27: 20 mL via PERINEURAL

## 2022-03-27 MED ORDER — ROCURONIUM BROMIDE 10 MG/ML (PF) SYRINGE
PREFILLED_SYRINGE | INTRAVENOUS | Status: AC
  Filled 2022-03-27: qty 10

## 2022-03-27 MED ORDER — FENTANYL CITRATE (PF) 100 MCG/2ML IJ SOLN
INTRAMUSCULAR | Status: AC
  Filled 2022-03-27: qty 2

## 2022-03-27 MED ORDER — ACETAMINOPHEN 500 MG PO TABS: 1000.0000 mg | ORAL_TABLET | Freq: Once | ORAL | Status: DC

## 2022-03-27 MED ORDER — MIDAZOLAM HCL 2 MG/2ML IJ SOLN
INTRAMUSCULAR | Status: AC
  Filled 2022-03-27: qty 2

## 2022-03-27 MED ORDER — ONDANSETRON HCL 4 MG/2ML IJ SOLN
INTRAMUSCULAR | Status: DC | PRN
  Administered 2022-03-27: 4 mg via INTRAVENOUS

## 2022-03-27 MED ORDER — OXYCODONE HCL 5 MG PO TABS: 5.0000 mg | ORAL_TABLET | Freq: Once | ORAL | Status: DC | PRN

## 2022-03-27 MED ORDER — SUGAMMADEX SODIUM 200 MG/2ML IV SOLN
INTRAVENOUS | Status: DC | PRN
  Administered 2022-03-27: 150 mg via INTRAVENOUS

## 2022-03-27 MED ORDER — ROCURONIUM BROMIDE 10 MG/ML (PF) SYRINGE
PREFILLED_SYRINGE | INTRAVENOUS | Status: DC | PRN
  Administered 2022-03-27: 80 mg via INTRAVENOUS

## 2022-03-27 MED ORDER — SODIUM CHLORIDE (PF) 0.9 % IJ SOLN
INTRAMUSCULAR | Status: AC
  Filled 2022-03-27: qty 10

## 2022-03-27 MED ORDER — ESCITALOPRAM OXALATE 10 MG PO TABS
5.0000 mg | ORAL_TABLET | Freq: Every day | ORAL | Status: DC
  Administered 2022-03-27: 5 mg via ORAL
  Filled 2022-03-27: qty 1

## 2022-03-27 MED ORDER — ACETAMINOPHEN 500 MG PO TABS
1000.0000 mg | ORAL_TABLET | ORAL | Status: AC
  Administered 2022-03-27: 1000 mg via ORAL
  Filled 2022-03-27: qty 2

## 2022-03-27 SURGICAL SUPPLY — 50 items
APL PRP STRL LF DISP 70% ISPRP (MISCELLANEOUS) ×1
APPLIER CLIP 9.375 MED OPEN (MISCELLANEOUS) ×2
APR CLP MED 9.3 20 MLT OPN (MISCELLANEOUS) ×2
BAG COUNTER SPONGE SURGICOUNT (BAG) ×1 IMPLANT
BINDER BREAST LRG (GAUZE/BANDAGES/DRESSINGS) IMPLANT
BINDER BREAST XLRG (GAUZE/BANDAGES/DRESSINGS) IMPLANT
BIOPATCH RED 1 DISK 7.0 (GAUZE/BANDAGES/DRESSINGS) IMPLANT
CANISTER SUCT 3000ML PPV (MISCELLANEOUS) ×1 IMPLANT
CHLORAPREP W/TINT 26 (MISCELLANEOUS) ×1 IMPLANT
CLIP APPLIE 9.375 MED OPEN (MISCELLANEOUS) ×1 IMPLANT
CNTNR URN SCR LID CUP LEK RST (MISCELLANEOUS) ×1 IMPLANT
CONT SPEC 4OZ STRL OR WHT (MISCELLANEOUS) ×2
COVER PROBE W GEL 5X96 (DRAPES) ×1 IMPLANT
COVER SURGICAL LIGHT HANDLE (MISCELLANEOUS) ×1 IMPLANT
DERMABOND ADVANCED .7 DNX12 (GAUZE/BANDAGES/DRESSINGS) ×1 IMPLANT
DRAIN CHANNEL 19F RND (DRAIN) ×1 IMPLANT
DRAPE CHEST BREAST 15X10 FENES (DRAPES) ×1 IMPLANT
ELECT CAUTERY BLADE 6.4 (BLADE) ×1 IMPLANT
ELECT REM PT RETURN 9FT ADLT (ELECTROSURGICAL) ×1
ELECTRODE REM PT RTRN 9FT ADLT (ELECTROSURGICAL) ×1 IMPLANT
EVACUATOR SILICONE 100CC (DRAIN) ×1 IMPLANT
GAUZE PAD ABD 7.5X8 STRL (GAUZE/BANDAGES/DRESSINGS) IMPLANT
GAUZE SPONGE 4X4 12PLY STRL LF (GAUZE/BANDAGES/DRESSINGS) IMPLANT
GLOVE BIO SURGEON STRL SZ8 (GLOVE) ×1 IMPLANT
GLOVE BIOGEL PI IND STRL 8 (GLOVE) ×1 IMPLANT
GOWN STRL REUS W/ TWL LRG LVL3 (GOWN DISPOSABLE) ×3 IMPLANT
GOWN STRL REUS W/ TWL XL LVL3 (GOWN DISPOSABLE) ×1 IMPLANT
GOWN STRL REUS W/TWL LRG LVL3 (GOWN DISPOSABLE) ×3
GOWN STRL REUS W/TWL XL LVL3 (GOWN DISPOSABLE) ×1
KIT BASIN OR (CUSTOM PROCEDURE TRAY) ×1 IMPLANT
KIT TURNOVER KIT B (KITS) ×1 IMPLANT
NDL 18GX1X1/2 (RX/OR ONLY) (NEEDLE) IMPLANT
NDL FILTER BLUNT 18X1 1/2 (NEEDLE) IMPLANT
NDL HYPO 25GX1X1/2 BEV (NEEDLE) IMPLANT
NEEDLE 18GX1X1/2 (RX/OR ONLY) (NEEDLE) IMPLANT
NEEDLE FILTER BLUNT 18X1 1/2 (NEEDLE) IMPLANT
NEEDLE HYPO 25GX1X1/2 BEV (NEEDLE) IMPLANT
NS IRRIG 1000ML POUR BTL (IV SOLUTION) ×1 IMPLANT
PACK GENERAL/GYN (CUSTOM PROCEDURE TRAY) ×1 IMPLANT
PAD ARMBOARD 7.5X6 YLW CONV (MISCELLANEOUS) ×1 IMPLANT
PENCIL SMOKE EVACUATOR (MISCELLANEOUS) ×1 IMPLANT
SPECIMEN JAR X LARGE (MISCELLANEOUS) ×1 IMPLANT
SPONGE T-LAP 18X18 ~~LOC~~+RFID (SPONGE) IMPLANT
SUT ETHILON 3 0 FSL (SUTURE) ×1 IMPLANT
SUT MNCRL AB 4-0 PS2 18 (SUTURE) ×1 IMPLANT
SUT VIC AB 3-0 SH 18 (SUTURE) ×1 IMPLANT
SYR CONTROL 10ML LL (SYRINGE) IMPLANT
TOWEL GREEN STERILE (TOWEL DISPOSABLE) ×1 IMPLANT
TOWEL GREEN STERILE FF (TOWEL DISPOSABLE) ×1 IMPLANT
TRACER MAGTRACE VIAL (MISCELLANEOUS) IMPLANT

## 2022-03-27 NOTE — Anesthesia Procedure Notes (Signed)
Procedure Name: Intubation Date/Time: 03/27/2022 12:46 PM  Performed by: Gwyndolyn Saxon, CRNAPre-anesthesia Checklist: Patient identified, Emergency Drugs available, Suction available and Patient being monitored Patient Re-evaluated:Patient Re-evaluated prior to induction Oxygen Delivery Method: Circle system utilized Preoxygenation: Pre-oxygenation with 100% oxygen Induction Type: IV induction Ventilation: Mask ventilation without difficulty Laryngoscope Size: Glidescope and 3 Grade View: Grade I Tube type: Oral Tube size: 7.0 mm Number of attempts: 1 Airway Equipment and Method: Rigid stylet Placement Confirmation: ETT inserted through vocal cords under direct vision, positive ETCO2 and breath sounds checked- equal and bilateral Secured at: 19 cm Tube secured with: Tape Dental Injury: Teeth and Oropharynx as per pre-operative assessment

## 2022-03-27 NOTE — Discharge Instructions (Signed)
[facility] [place] [phone]  MASTECTOMY: POST OP INSTRUCTIONS  Always review your discharge instruction sheet given to you by the facility where your surgery was performed. IF YOU HAVE DISABILITY OR FAMILY LEAVE FORMS, YOU MUST BRING THEM TO THE OFFICE FOR PROCESSING.   DO NOT GIVE THEM TO YOUR DOCTOR. A prescription for pain medication may be given to you upon discharge.  Take your pain medication as prescribed, if needed.  If narcotic pain medicine is not needed, then you may take acetaminophen (Tylenol) or ibuprofen (Advil) as needed. Take your usually prescribed medications unless otherwise directed. If you need a refill on your pain medication, please contact your pharmacy.  They will contact our office to request authorization.  Prescriptions will not be filled after 5pm or on week-ends. You should follow a light diet the first few days after arrival home, such as soup and crackers, etc.  Resume your normal diet the day after surgery. Most patients will experience some swelling and bruising on the chest and underarm.  Ice packs will help.  Swelling and bruising can take several days to resolve.  It is common to experience some constipation if taking pain medication after surgery.  Increasing fluid intake and taking a stool softener (such as Colace) will usually help or prevent this problem from occurring.  A mild laxative (Milk of Magnesia or Miralax) should be taken according to package instructions if there are no bowel movements after 48 hours. Unless discharge instructions indicate otherwise, leave your bandage dry and in place until your next appointment in 3-5 days.  You may take a limited sponge bath.  No tube baths or showers until the drains are removed.  You may have steri-strips (small skin tapes) in place directly over the incision.  These strips should be left on the skin for 7-10 days.  If your surgeon used skin glue on the incision, you may shower in 24 hours.   The glue will flake off over the next 2-3 weeks.  Any sutures or staples will be removed at the office during your follow-up visit. DRAINS:  If you have drains in place, it is important to keep a list of the amount of drainage produced each day in your drains.  Before leaving the hospital, you should be instructed on drain care.  Call our office if you have any questions about your drains. ACTIVITIES:  You may resume regular (light) daily activities beginning the next day--such as daily self-care, walking, climbing stairs--gradually increasing activities as tolerated.  You may have sexual intercourse when it is comfortable.  Refrain from any heavy lifting or straining until approved by your doctor. You may drive when you are no longer taking prescription pain medication, you can comfortably wear a seatbelt, and you can safely maneuver your car and apply brakes. RETURN TO WORK:  __________________________________________________________ You should see your doctor in the office for a follow-up appointment approximately 3-5 days after your surgery.  Your doctor's nurse will typically make your follow-up appointment when she calls you with your pathology report.  Expect your pathology report 2-3 business days after your surgery.  You may call to check if you do not hear from us after three days.   OTHER INSTRUCTIONS: ______________________________________________________________________________________________ ____________________________________________________________________________________________ WHEN TO CALL YOUR DOCTOR: Fever over 101.0 Nausea and/or vomiting Extreme swelling or bruising Continued bleeding from incision. Increased pain, redness, or drainage from the incision. The clinic staff is available to answer your questions during regular business hours.  Please don't hesitate   to call and ask to speak to one of the nurses for clinical concerns.  If you have a medical emergency, go to the  nearest emergency room or call 911.  A surgeon from [facility] is always on call at the hospital. [address], [address], [place], [place]  [address] ? [address] [address], [place], [place]   [address] [phone] ? [phone] ? FAX [phone] Web site: [email or web address]  

## 2022-03-27 NOTE — Transfer of Care (Signed)
Immediate Anesthesia Transfer of Care Note  Patient: Sydney Key  Procedure(s) Performed: RIGHT SIMPLE MASTECTOMY WITH RIGHT SENTINEL LYMPH NODE BIOPSY (Right: Breast)  Patient Location: PACU  Anesthesia Type:General and Regional  Level of Consciousness: awake, alert  and oriented  Airway & Oxygen Therapy: Patient Spontanous Breathing and Patient connected to face mask oxygen  Post-op Assessment: Report given to RN and Post -op Vital signs reviewed and stable  Post vital signs: Reviewed and stable  Last Vitals:  Vitals Value Taken Time  BP 147/65 03/27/22 1523  Temp    Pulse 78 03/27/22 1525  Resp 17 03/27/22 1525  SpO2 97 % 03/27/22 1525  Vitals shown include unvalidated device data.  Last Pain:  Vitals:   03/27/22 1144  TempSrc:   PainSc: 0-No pain         Complications: No notable events documented.

## 2022-03-27 NOTE — H&P (Signed)
History of Present Illness: Sydney Key is a 88 y.o. female who is seen today for follow-up after neoadjuvant chemotherapy for right breast cancer. Unfortunately tumor is gotten bigger despite its HER2/neu positivity. She presents today to discuss surgical options with her daughters. Otherwise she feels well today..    Review of Systems: A complete review of systems was obtained from the patient. I have reviewed this information and discussed as appropriate with the patient. See HPI as well for other ROS.  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  aspirin 81 MG EC tablet Take 81 mg by mouth once daily  brimonidine (ALPHAGAN P) 0.15 % ophthalmic solution Administer 1 drop to both eyes two (2) times a day.  cholecalciferol (VITAMIN D3) 1000 unit tablet Take by mouth  cyanocobalamin (VITAMIN B12) 1,000 mcg/mL injection Inject into the muscle  escitalopram oxalate (LEXAPRO) 10 MG tablet Take 10 mg by mouth once daily  gabapentin (NEURONTIN) 300 MG capsule Take 300 mg by mouth at bedtime  multivitamin with iron-minerals (SUPER THERA VITE M) tablet Take 1 tablet by mouth   No current facility-administered medications on file prior to visit.   History reviewed. No pertinent family history.   Social History   Tobacco Use  Smoking Status  Never  Smokeless Tobacco Never    Social History   Socioeconomic History  Marital status: Married  Tobacco Use  Smoking status: Never  Smokeless tobacco: Never  Vaping Use  Vaping Use: Never used  Substance and Sexual Activity  Alcohol use: Defer  Drug use: Defer   Objective:   There were no vitals filed for this visit.  There is no height or weight on file to calculate BMI.  Physical Exam Cardiovascular:  Rate and Rhythm: Normal rate.  Pulmonary:  Breath sounds: Normal breath sounds.  Chest:  Breasts: Left: Normal.   Comments: 4 cm mobile mass medial right breast. Musculoskeletal:  General: Normal range of motion.  Lymphadenopathy:  Upper Body:  Right upper body: No supraclavicular or axillary adenopathy.  Left upper body: No supraclavicular or axillary adenopathy.  Skin: General: Skin is warm.  Neurological:  General: No focal deficit present.  Mental Status: She is alert.    Labs, Imaging and Diagnostic Testing:  CLINICAL DATA: 88 year old female who presented in May of 2023 with a palpable lump in the right breast. Subsequent ultrasound guided biopsy was performed demonstrating grade 3 invasive ductal carcinoma. She had an intramammary lymph node also biopsied, which was benign. In June of 2023, she had a MRI guided biopsy of the retroareolar right breast at 1 o'clock which was found to be benign (barbell clip). She is undergoing neoadjuvant chemotherapy with plans for breast conserving surgery.  EXAM: BILATERAL BREAST MRI WITH AND WITHOUT CONTRAST  TECHNIQUE: Multiplanar, multisequence MR images of both breasts were obtained prior to  and following the intravenous administration of 6 ml of Gadavist  Three-dimensional MR images were rendered by post-processing of the original MR data on an independent workstation. The three-dimensional MR images were interpreted, and findings are reported in the following complete MRI report for this study.  Three dimensional images were evaluated at the independent interpreting workstation using the DynaCAD thin client.  COMPARISON: Previous exam(s).  FINDINGS: Breast composition: b. Scattered fibroglandular tissue.  Background parenchymal enhancement: Mild  Right breast: There is again noted a large heterogeneously enhancing centrally necrotic mass in the medial anterior right breast measuring 4.9 x 3.4 x 3.7 cm, previously measuring 3.1 x 2.8 x 3.1 cm in June of 2023. Linear non mass enhancement is again noted to extend anterior and posterior to the mass, which measures 7 cm in the sagittal plane. The linear non mass enhancement appears particularly more prominent posterior to the mass extending 2.9 cm posterior, previously 1 cm. Some of this linear enhancement posterior can be seen in the axial images in series 10, image 98 and the anterior linear enhancement in series 10, image 90. The anterior enhancement extends to the nipple, and may involve the nipple/areola.  Left breast: No mass or abnormal enhancement.  Lymph nodes: No abnormal appearing lymph nodes.  Ancillary findings: Nonenhancing masses in the liver again noted, T2 bright on the prior study are compatible with hepatic cysts.  IMPRESSION: 1. Increase in size of the biopsy-proven malignancy in the medial anterior right breast. The mass measures 4.9 cm, previously 3.1 cm in June of 2023. There is increasing linear non mass enhancement extending anterior and posterior to the mass, which altogether spans 7 cm. The enhancement extends to the nipple, and may also enhance within the nipple/areola.  2. No evidence of left breast malignancy.  RECOMMENDATION: Management per clinical/surgical team.  BI-RADS CATEGORY 6: Known biopsy-proven malignancy.   Electronically Signed By: Ammie Ferrier M.D. On: 03/13/2022 14:03  ADDITIONAL INFORMATION: PROGNOSTIC INDICATORS Results: IMMUNOHISTOCHEMICAL AND MORPHOMETRIC  ANALYSIS PERFORMED MANUALLY The tumor cells are POSITIVE for Her2 (3+). Estrogen Receptor: 0%, NEGATIVE Progesterone Receptor: 0%, NEGATIVE Proliferation Marker Ki67: 98% COMMENT: The negative hormone receptor study(ies) in this case has an internal positive control. REFERENCE RANGE ESTROGEN RECEPTOR NEGATIVE 0% POSITIVE =>1% REFERENCE RANGE PROGESTERONE RECEPTOR NEGATIVE 0% POSITIVE =>1% All controls stained appropriately Tobin Chad MD Pathologist, Electronic Signature ( Signed 12/05/2021) FINAL DIAGNOSIS Diagnosis Breast, right, needle core biopsy, 3 o'clock, 4cmfn - INVASIVE DUCTAL CARCINOMA WITH NECROSIS, GRADE 3. - SEE NOTE 1 of 2 FINAL for Koeller, Khloie C ([id number]) Diagnosis Note The greatest tumor dimension is 1.4 cm. Breast prognostic markers will be performed. Dr. Alric Seton agrees. Called to [employer] on 11/30/2021. Claudette Laws MD Pathologist, Electronic Signature (Case signed 11/30/2021) Specimen Gross and Clinical Information Specimen Comment TIF: 1:48 pm, 88 yo wf with BIRADS 5 mass worrisome for high grade IDC Specimen(s) Obtained: Breast, right, needle core biopsy, 3 o'clock, 4cmfn Specimen Clinical Information BIRADS  Assessment and Plan:   Diagnoses and all orders for this visit:  Malignant neoplasm of lower-inner quadrant of right breast of female, estrogen receptor negative (CMS-HCC)   Patient did not respond well to chemotherapy with increasing size of tumor despite HER2/neu positivity. Recommend proceeding with right simple mastectomy with sentinel lymph node mapping. Risks and benefits of surgery discussed. Complications of surgery discussed. We discussed that she no longer is a good candidate for lumpectomy due to increasing size of breast size. They understand the above and wished to proceed  with surgery. Discussed risk of bleeding, infection, flap necrosis, return to operating room, injury to major blood vessels, nerves, and  the need for the treatments or procedures as well as anesthesia risk. I will keep for now for potential future treatment.  No follow-ups on file.  Kennieth Francois, MD

## 2022-03-27 NOTE — Anesthesia Postprocedure Evaluation (Signed)
Anesthesia Post Note  Patient: Sydney Key  Procedure(s) Performed: RIGHT SIMPLE MASTECTOMY WITH RIGHT SENTINEL LYMPH NODE BIOPSY (Right: Breast)     Patient location during evaluation: PACU Anesthesia Type: Regional and General Level of consciousness: awake and alert, oriented and patient cooperative Pain management: pain level controlled Vital Signs Assessment: post-procedure vital signs reviewed and stable Respiratory status: spontaneous breathing, nonlabored ventilation and respiratory function stable Cardiovascular status: blood pressure returned to baseline and stable Postop Assessment: no apparent nausea or vomiting Anesthetic complications: no   No notable events documented.  Last Vitals:  Vitals:   03/27/22 1545 03/27/22 1600  BP: 132/66 138/68  Pulse: 74 71  Resp: 14 13  Temp:  36.4 C  SpO2: 99% 99%    Last Pain:  Vitals:   03/27/22 1545  TempSrc:   PainSc: 0-No pain                 Pervis Hocking

## 2022-03-27 NOTE — Anesthesia Preprocedure Evaluation (Addendum)
Anesthesia Evaluation  Patient identified by MRN, date of birth, ID band Patient awake    Reviewed: Allergy & Precautions, NPO status , Patient's Chart, lab work & pertinent test results  Airway Mallampati: IV  TM Distance: >3 FB Neck ROM: Full    Dental  (+) Dental Advisory Given, Teeth Intact   Pulmonary neg pulmonary ROS,    Pulmonary exam normal breath sounds clear to auscultation       Cardiovascular negative cardio ROS Normal cardiovascular exam Rhythm:Regular Rate:Normal     Neuro/Psych PSYCHIATRIC DISORDERS Anxiety negative neurological ROS     GI/Hepatic negative GI ROS, Neg liver ROS,   Endo/Other  negative endocrine ROS  Renal/GU negative Renal ROS  negative genitourinary   Musculoskeletal negative musculoskeletal ROS (+)   Abdominal   Peds  Hematology negative hematology ROS (+) Hb 11.1   Anesthesia Other Findings   Reproductive/Obstetrics negative OB ROS                            Anesthesia Physical Anesthesia Plan  ASA: 2  Anesthesia Plan: General and Regional   Post-op Pain Management: Regional block* and Tylenol PO (pre-op)*   Induction: Intravenous  PONV Risk Score and Plan: 3 and Ondansetron, Dexamethasone, Midazolam and Treatment may vary due to age or medical condition  Airway Management Planned: Oral ETT and Video Laryngoscope Planned  Additional Equipment: None  Intra-op Plan:   Post-operative Plan: Extubation in OR  Informed Consent: I have reviewed the patients History and Physical, chart, labs and discussed the procedure including the risks, benefits and alternatives for the proposed anesthesia with the patient or authorized representative who has indicated his/her understanding and acceptance.     Dental advisory given  Plan Discussed with: CRNA  Anesthesia Plan Comments:        Anesthesia Quick Evaluation

## 2022-03-27 NOTE — Anesthesia Procedure Notes (Signed)
Anesthesia Regional Block: Pectoralis block   Pre-Anesthetic Checklist: , timeout performed,  Correct Patient, Correct Site, Correct Laterality,  Correct Procedure, Correct Position, site marked,  Risks and benefits discussed,  Surgical consent,  Pre-op evaluation,  At surgeon's request and post-op pain management  Laterality: Right  Prep: Maximum Sterile Barrier Precautions used, chloraprep       Needles:  Injection technique: Single-shot  Needle Type: Echogenic Stimulator Needle     Needle Length: 9cm  Needle Gauge: 22     Additional Needles:   Procedures:,,,, ultrasound used (permanent image in chart),,    Narrative:  Start time: 03/27/2022 12:25 PM End time: 03/27/2022 12:30 PM Injection made incrementally with aspirations every 5 mL.  Performed by: Personally  Anesthesiologist: Pervis Hocking, DO  Additional Notes: Monitors applied. No increased pain on injection. No increased resistance to injection. Injection made in 5cc increments. Good needle visualization. Patient tolerated procedure well.

## 2022-03-27 NOTE — Op Note (Signed)
Preoperative diagnosis: Stage II right breast cancer  Postoperative diagnosis: Same  Procedure: Right breast simple mastectomy with right axillary sentinel lymph node mapping using mag trace in methylene blue tracer  Surgeon: Erroll Luna Assistant: Dr. Karlene Lineman MD  EBL: 20 cc  Drains: 19 round  Specimen: Right breast to pathology.  There were 4 right extra nodes that were sentinel nodes that were blue and hot sent to pathology.  The largest abnormal node was frozen section found to be consistent with a benign node.  Anesthesia: General with pectoral block  Indications for procedure: The patient is a 88 year old female who has completed neoadjuvant chemotherapy for locally advanced right breast cancer.  Unfortunately, she has progressed through chemotherapy and presents today for surgery.  Due to tumor size and breast size, she is opted for mastectomy.The surgical and non surgical options have been discussed with the patient.  Risks of surgery include bleeding,  Infection,  Flap necrosis,  Tissue loss,  Chronic pain, death, Numbness,  And the need for additional procedures.  Reconstruction options also have been discussed with the patient as well.  The patient agrees to proceed. Sentinel lymph node mapping and dissection has been discussed with the patient.  Risk of bleeding,  Infection,  Seroma formation,  Additional procedures,,  Shoulder weakness ,  Shoulder stiffness,  Nerve and blood vessel injury and reaction to the mapping dyes have been discussed.  Alternatives to surgery have been discussed with the patient.  The patient agrees to proceed.   Description of procedure: The patient was met in the holding area and questions were answered.  A pectoral block was placed by anesthesia to the right breast.  All questions were answered.  The site was marked.  She was then taken back to the operating room.  She is placed upon upon the OR table.  After induction of general esthesia, the right breast was  prepped sterilely with alcohol swipe.  I then injected 2 cc of mag trace under the right nipple.  Also 4 cc of methylene blue were injected using a dual tracer given her previous history of neoadjuvant chemotherapy.  This was massaged for 5 minutes.  Her right breast was then prepped and draped in sterile fashion and a second timeout performed.  Elliptical incisions were made above and below the nipple-areolar complex in a fishmouth configuration.  The superior skin flap was taken to the clavicle.  The inferior skin flap was taken down to the inframammary fold.  The breast was then dissected off the pectoralis muscle to include the fascia in a medial lateral fashion to the lateral attachments were encountered.  These were divided.  The this was oriented and sent to pathology.  The mag trace probe was used.  Hot spot was identified and resolved dissection was carried down into the level 1 lymph node basin.  The blue dye was also visible and at least 3 nodes that look like.  1 was markedly abnormal appearing.  All 3 of these nodes were removed in this blue hot sentinel nodes.  I opted to send in the more abnormal node to pathology for frozen section and this was negative for metastatic disease.  Irrigation was used.  Hemostasis achieved with cautery.  A 19 round drain was placed to the inferior flap and secured to the skin with nylon 2 oh.  The wound was then closed with a deep layer 3-0 Vicryl.  4 Monocryl was used to approximate the skin.  Dermabond and breast binder  placed.  All counts were found to be correct.  The patient was awoke extubated taken to recovery in satisfactory condition.

## 2022-03-27 NOTE — Interval H&P Note (Signed)
History and Physical Interval Note:  03/27/2022 12:05 PM  Sydney Key  has presented today for surgery, with the diagnosis of RIGHT BREAST CANCER.  The various methods of treatment have been discussed with the patient and family. After consideration of risks, benefits and other options for treatment, the patient has consented to  Procedure(s): RIGHT SIMPLE MASTECTOMY WITH RIGHT SENTINEL LYMPH NODE BIOPSY (Right) as a surgical intervention.  The patient's history has been reviewed, patient examined, no change in status, stable for surgery.  I have reviewed the patient's chart and labs.  Questions were answered to the patient's satisfaction.     Rachel

## 2022-03-28 ENCOUNTER — Encounter (HOSPITAL_COMMUNITY): Payer: Self-pay | Admitting: Surgery

## 2022-03-28 DIAGNOSIS — Z7982 Long term (current) use of aspirin: Secondary | ICD-10-CM | POA: Diagnosis not present

## 2022-03-28 DIAGNOSIS — C50311 Malignant neoplasm of lower-inner quadrant of right female breast: Secondary | ICD-10-CM | POA: Diagnosis not present

## 2022-03-28 DIAGNOSIS — Z79899 Other long term (current) drug therapy: Secondary | ICD-10-CM | POA: Diagnosis not present

## 2022-03-28 MED ORDER — ACETAMINOPHEN 500 MG PO TABS: 1000.0000 mg | ORAL_TABLET | Freq: Four times a day (QID) | ORAL | 2 refills | Status: DC | PRN

## 2022-03-28 NOTE — Progress Notes (Signed)
Patient discharged off of unit with all belongings. Discharge papers/instructions explained by nurse to family and patient. Patient and family have no further questions at time of discharge. No complications noted at this time.  [place]

## 2022-03-28 NOTE — Discharge Summary (Signed)
Physician Discharge Summary  Patient ID: Sydney Key MRN: [id number] DOB/AGE: 1930/12/22 88 y.o.  Admit date: 03/27/2022 Discharge date: 03/28/2022  Admission Diagnoses: Stage II right breast cancer  Discharge Diagnoses:  Principal Problem:   Breast cancer, stage 2, right [facility])   Discharged Condition: good  Hospital Course: Patient underwent right simple mastectomy with right axillary sentinel lymph node mapping after neoadjuvant chemotherapy for stage II right breast cancer.  She had a poor response to chemotherapy with increase in size of her tumor.  She did well with surgery.  She was tolerating her diet, able to ambulate and had good pain control postop day 1.  No evidence of hematoma on examination and drain output was serosanguineous and appropriate.  She was discharged home on postoperative day 1.      Treatments: surgery: Right simple mastectomy with sentinel lymph node mapping  Discharge Exam: Blood pressure (!) 92/44, pulse 70, temperature (!) 97.3 F (36.3 C), temperature source Oral, resp. rate 17, height 5' 5.5" (1.664 m), weight 61.7 kg, SpO2 95 %. General appearance: alert and cooperative Resp: clear to auscultation bilaterally Cardio: Normal sinus rhythm Incision/Wound: Right mastectomy wound clean dry intact.  Flaps viable.  No hematoma.  Serosanguineous drainage noted.  Disposition: Discharge disposition: 01-Home or Self Care       Discharge Instructions     Diet - low sodium heart healthy   Complete by: As directed    Increase activity slowly   Complete by: As directed       Allergies as of 03/28/2022       Reactions   Paclitaxel    Back pain Pt states she doesn't think this is an allergy. It wasn't back pain. She just felt a cold sensation down her back when receiving the medication        Medication List     TAKE these medications    acetaminophen 500 MG tablet Commonly known as: TYLENOL Take 500 mg by mouth at bedtime. What  changed: Another medication with the same name was added. Make sure you understand how and when to take each.   acetaminophen 500 MG tablet Commonly known as: TYLENOL Take 2 tablets (1,000 mg total) by mouth every 6 (six) hours as needed. What changed: You were already taking a medication with the same name, and this prescription was added. Make sure you understand how and when to take each.   aspirin EC 81 MG tablet Take 81 mg by mouth every evening.   brimonidine 0.15 % ophthalmic solution Commonly known as: ALPHAGAN Place 1 drop into the left eye 2 (two) times daily.   cholecalciferol 1000 units tablet Commonly known as: VITAMIN D Take 1,000 Units by mouth daily.   cyanocobalamin 1000 MCG/ML injection Commonly known as: VITAMIN B12 Inject 1,000 mcg into the muscle every 30 (thirty) days.   escitalopram 10 MG tablet Commonly known as: LEXAPRO Take 5 mg by mouth at bedtime.   gabapentin 300 MG capsule Commonly known as: NEURONTIN Take 300 mg by mouth at bedtime.   loperamide 2 MG capsule Commonly known as: IMODIUM Take 1 capsule (2 mg total) by mouth as needed for diarrhea or loose stools.   multivitamin with minerals Tabs tablet Take 1 tablet by mouth daily.   vitamin C with rose hips 500 MG tablet Take 500 mg by mouth daily.         Signed: Turner Daniels MD 03/28/2022, 7:35 AM

## 2022-03-28 NOTE — Plan of Care (Signed)

## 2022-04-01 ENCOUNTER — Encounter: Payer: Self-pay | Admitting: *Deleted

## 2022-04-01 ENCOUNTER — Encounter: Payer: Self-pay | Admitting: Surgery

## 2022-04-02 ENCOUNTER — Other Ambulatory Visit: Payer: Self-pay | Admitting: Hematology and Oncology

## 2022-04-02 ENCOUNTER — Telehealth: Payer: Self-pay | Admitting: Hematology and Oncology

## 2022-04-02 ENCOUNTER — Encounter: Payer: Self-pay | Admitting: *Deleted

## 2022-04-02 ENCOUNTER — Other Ambulatory Visit: Payer: Self-pay

## 2022-04-02 ENCOUNTER — Inpatient Hospital Stay: Payer: Medicare Other

## 2022-04-02 ENCOUNTER — Encounter: Payer: Self-pay | Admitting: Adult Health

## 2022-04-02 ENCOUNTER — Inpatient Hospital Stay (HOSPITAL_BASED_OUTPATIENT_CLINIC_OR_DEPARTMENT_OTHER): Payer: Medicare Other | Admitting: Adult Health

## 2022-04-02 DIAGNOSIS — Z5112 Encounter for antineoplastic immunotherapy: Secondary | ICD-10-CM | POA: Diagnosis not present

## 2022-04-02 DIAGNOSIS — Z79899 Other long term (current) drug therapy: Secondary | ICD-10-CM | POA: Diagnosis not present

## 2022-04-02 DIAGNOSIS — Z5111 Encounter for antineoplastic chemotherapy: Secondary | ICD-10-CM | POA: Diagnosis not present

## 2022-04-02 DIAGNOSIS — Z803 Family history of malignant neoplasm of breast: Secondary | ICD-10-CM | POA: Diagnosis not present

## 2022-04-02 DIAGNOSIS — Z9011 Acquired absence of right breast and nipple: Secondary | ICD-10-CM | POA: Diagnosis not present

## 2022-04-02 DIAGNOSIS — Z171 Estrogen receptor negative status [ER-]: Secondary | ICD-10-CM

## 2022-04-02 DIAGNOSIS — C50311 Malignant neoplasm of lower-inner quadrant of right female breast: Secondary | ICD-10-CM | POA: Diagnosis not present

## 2022-04-02 NOTE — Assessment & Plan Note (Signed)
Sydney Key is an 88 year old woman with history of stage IIa HER2 positive breast cancer, status post neoadjuvant chemotherapy with Taxol and carbo and mastectomy and lymph node biopsy that occurred on March 27, 2022.  Dr. Lindi Adie myself reviewed Sydney Key's pathology results with her which revealed no chemotherapy effect on the breast cancer and continued T2N0 residual disease.  We reviewed Kadcyla as the treatment of choice at this point and recommended this to her.  We discussed side effects risks benefits in detail.  She is due for her echocardiogram.  She has no cardiac symptoms so we will plan for this in 3 weeks since she did just undergo mastectomy.  She is healing from her mastectomy well and has follow-up with Dr. Brantley Stage on October 6.  We encouraged her to bring any of her post surgery questions to him for further discussion.  Sydney Key and her daughter are in agreement with the treatment plan change and we will see her back in 1 week to get started.

## 2022-04-02 NOTE — Telephone Encounter (Signed)
Scheduled appointments per WQ. Patient is aware. 

## 2022-04-02 NOTE — Progress Notes (Addendum)
[facility] up:    Raelene Bott, MD [address] [place] [address]   DIAGNOSIS:  Cancer Staging  Malignant neoplasm of lower-inner quadrant of right breast of female, estrogen receptor negative ([place]) Staging form: Breast, AJCC 8th Edition - Clinical: Stage IIA (cT2, cN0, cM0, G3, ER-, PR-, HER2+) - Signed by Nicholas Lose, MD on 12/05/2021 Stage prefix: Initial diagnosis Histologic grading system: 3 grade system   SUMMARY OF ONCOLOGIC HISTORY: Oncology History  Malignant neoplasm of lower-inner quadrant of right breast of female, estrogen receptor negative ([place])  11/28/2021 Initial Diagnosis   Palpable right breast lump 2.6 cm with calcifications at 3 o'clock position, additional 1.3 cm lesion is an intramammary lymph node biopsy: Benign, axilla negative, biopsy of the breast lump: Grade 3 IDC ER 0%, PR 0%, Ki-67 98%, HER2 3+ positive   12/05/2021 Cancer Staging   Staging form: Breast, AJCC 8th Edition - Clinical: Stage IIA (cT2, cN0, cM0, G3, ER-, PR-, HER2+) - Signed by Nicholas Lose, MD on 12/05/2021 Stage prefix: Initial diagnosis Histologic grading system: 3 grade system   12/26/2021 - 03/12/2022 Chemotherapy   Patient is on Treatment Plan : BREAST Paclitaxel + Trastuzumab q7d / Trastuzumab q21d      Genetic Testing   [facility] CustomNext Panel was Negative. Report date is 12/18/2021.  The CustomNext-Cancer+RNAinsight panel offered by [facility] includes sequencing and rearrangement analysis for the following 47 genes:  APC, ATM, AXIN2, BARD1, BMPR1A, BRCA1, BRCA2, BRIP1, CDH1, CDK4, CDKN2A, CHEK2, CTNNA1, DICER1, EPCAM, GREM1, HOXB13, KIT, MEN1, MLH1, MSH2, MSH3, MSH6, MUTYH, NBN, NF1, NTHL1, PALB2, PDGFRA, PMS2, POLD1, POLE, PTEN, RAD50, RAD51C, RAD51D, SDHA, SDHB, SDHC, SDHD, SMAD4, SMARCA4, STK11, TP53, TSC1, TSC2, and VHL.  RNA data is routinely analyzed for use in variant interpretation for all genes.   03/27/2022 Surgery    Right breast mastectomy: Invasive ductal carcinoma Nottingham grade 3 with no morphologic evidence of neoadjuvant chemotherapy effect negative for lymphovascular and perineural invasion the size noted was 4.2 x 3.7 x 3.5 cm margins were negative and 2 lymph nodes biopsied were negative. Cancer staging: ypT2 pN0 pM0   04/02/2022 - 04/02/2022 Chemotherapy   Patient is on Treatment Plan : BREAST Trastuzumab IV (8/6) or SQ (600) D1 q21d       CURRENT THERAPY: Status postmastectomy to start Herceptin  INTERVAL HISTORY: GENEAL HUEBERT 88 y.o. female returns for follow-up of her HER2 positive breast cancer.  Her most recent echocardiogram was completed on December 13, 2021 showing a left ventricular ejection fraction of 60 to 65%.  She underwent right breast mastectomy and 2 sentinel node biopsy that demonstrated invasive ductal carcinoma grade 3 with no morphologic evidence of neoadjuvant chemotherapy effect, negative for lymphovascular and perineural invasion, size 4.2 x 3.7 x 3.5 cm margins were negative.  Of the sentinel lymph nodes that were biopsied were negative.  Kymoni tells me that she tolerated chemotherapy well has no residual peripheral neuropathy and then tolerated surgery last week well.  She has 1 drain in place and has no pain.  She continues to wear her binder but wishes that she did not have to wear it.  Otherwise she is living alone with no issues and is able to care for herself and participate in her normal daily activities.  Geniya lives in [place] and her daughter also lives in [place] about 5 minutes away.   Patient Active Problem List   Diagnosis Date Noted   Port-A-Cath  in place 01/09/2022   Genetic testing 12/21/2021   Family history of breast cancer 12/06/2021   Malignant neoplasm of lower-inner quadrant of right breast of female, estrogen receptor negative ([place]) 12/04/2021    is allergic to paclitaxel.  MEDICAL HISTORY: Past Medical History:  Diagnosis Date   Anxiety     Cancer [facility]) 2023   Right breast cancer   History of kidney stones     SURGICAL HISTORY: Past Surgical History:  Procedure Laterality Date   ABDOMINAL HYSTERECTOMY  age 26   COLONOSCOPY N/A 05/14/2013   Procedure: COLONOSCOPY;  Surgeon: Cleotis Nipper, MD;  Location: [facility];  Service: Endoscopy;  Laterality: N/A;   EYE SURGERY Left 07/09/2011   retina detachment   FOOT SURGERY Right    "shaved down" bone on Great toe   HOT HEMOSTASIS N/A 05/14/2013   Procedure: HOT HEMOSTASIS (ARGON PLASMA COAGULATION/BICAP);  Surgeon: Cleotis Nipper, MD;  Location: [facility];  Service: Endoscopy;  Laterality: N/A;   PORTACATH PLACEMENT Right 01/04/2022   Procedure: PORT PLACEMENT WITH ULTRASOUND GUIDANCE;  Surgeon: Erroll Luna, MD;  Location: [facility];  Service: General;  Laterality: Right;   SIMPLE MASTECTOMY WITH AXILLARY SENTINEL NODE BIOPSY Right 03/27/2022   Procedure: RIGHT SIMPLE MASTECTOMY WITH RIGHT SENTINEL LYMPH NODE BIOPSY;  Surgeon: Erroll Luna, MD;  Location: [facility];  Service: General;  Laterality: Right;    SOCIAL HISTORY: Social History   Socioeconomic History   Marital status: Widowed    Spouse name: Not on file   Number of children: 3   Years of education: Not on file   Highest education level: Not on file  Occupational History   Not on file  Tobacco Use   Smoking status: Never   Smokeless tobacco: Never  Vaping Use   Vaping Use: Never used  Substance and Sexual Activity   Alcohol use: No   Drug use: No   Sexual activity: Not on file  Other Topics Concern   Not on file  Social History Narrative   Not on file   Social Determinants of Health   Financial Resource Strain: Not on file  Food Insecurity: No Food Insecurity (03/27/2022)   Hunger Vital Sign    Worried About Running Out of Food in the Last Year: Never true    Ran Out of Food in the Last Year: Never true  Transportation Needs: No Transportation Needs (03/27/2022)    PRAPARE - Hydrologist (Medical): No    Lack of Transportation (Non-Medical): No  Physical Activity: Not on file  Stress: Not on file  Social Connections: Not on file  Intimate Partner Violence: Not At Risk (03/27/2022)   Humiliation, Afraid, Rape, and Kick questionnaire    Fear of Current or Ex-Partner: No    Emotionally Abused: No    Physically Abused: No    Sexually Abused: No    FAMILY HISTORY: Family History  Problem Relation Age of Onset   Cancer Mother 100       unknown type- the cancer was on her neck   Colon cancer Father 77   Breast cancer Sister 106   Breast cancer Niece 50   Breast cancer Niece 69   Breast cancer Daughter 15       negative genetic testing    Review of Systems  Constitutional:  Negative for appetite change, chills, fatigue, fever and unexpected weight change.  HENT:   Negative for hearing loss, lump/mass and trouble swallowing.  Eyes:  Negative for eye problems and icterus.  Respiratory:  Negative for chest tightness, cough and shortness of breath.   Cardiovascular:  Negative for chest pain, leg swelling and palpitations.  Gastrointestinal:  Negative for abdominal distention, abdominal pain, constipation, diarrhea, nausea and vomiting.  Endocrine: Negative for hot flashes.  Genitourinary:  Negative for difficulty urinating.   Musculoskeletal:  Negative for arthralgias.  Skin:  Negative for itching and rash.  Neurological:  Negative for dizziness, extremity weakness, headaches and numbness.  Hematological:  Negative for adenopathy. Does not bruise/bleed easily.  Psychiatric/Behavioral:  Negative for depression. The patient is not nervous/anxious.       PHYSICAL EXAMINATION  ECOG PERFORMANCE STATUS: 1 - Symptomatic but completely ambulatory  Vitals:   04/02/22 1000  BP: 122/63  Pulse: 65  Resp: 18  Temp: (!) 97.4 F (36.3 C)  SpO2: 99%    Physical Exam Constitutional:      General: She is not in acute  distress.    Appearance: Normal appearance. She is not toxic-appearing.  HENT:     Head: Normocephalic and atraumatic.  Eyes:     General: No scleral icterus. Cardiovascular:     Rate and Rhythm: Normal rate and regular rhythm.     Pulses: Normal pulses.     Heart sounds: Normal heart sounds.  Pulmonary:     Effort: Pulmonary effort is normal.     Breath sounds: Normal breath sounds.  Abdominal:     General: Abdomen is flat. Bowel sounds are normal. There is no distension.     Palpations: Abdomen is soft.     Tenderness: There is no abdominal tenderness.  Musculoskeletal:        General: No swelling.     Cervical back: Neck supple.  Lymphadenopathy:     Cervical: No cervical adenopathy.  Skin:    General: Skin is warm and dry.     Findings: No rash.  Neurological:     General: No focal deficit present.     Mental Status: She is alert.  Psychiatric:        Mood and Affect: Mood normal.        Behavior: Behavior normal.     LABORATORY DATA:  CBC    Component Value Date/Time   WBC 4.0 03/22/2022 1430   RBC 3.41 (L) 03/22/2022 1430   HGB 11.1 (L) 03/22/2022 1430   HGB 12.3 12/05/2021 1240   HCT 33.7 (L) 03/22/2022 1430   PLT 250 03/22/2022 1430   PLT 189 12/05/2021 1240   MCV 98.8 03/22/2022 1430   MCH 32.6 03/22/2022 1430   MCHC 32.9 03/22/2022 1430   RDW 14.0 03/22/2022 1430   LYMPHSABS 1.3 03/12/2022 1038   MONOABS 0.4 03/12/2022 1038   EOSABS 0.1 03/12/2022 1038   BASOSABS 0.0 03/12/2022 1038    CMP     ASSESSMENT and THERAPY PLAN:   Malignant neoplasm of lower-inner quadrant of right breast of female, estrogen receptor negative ([facility]) Fantashia is an 88 year old woman with history of stage IIa HER2 positive breast cancer, status post neoadjuvant chemotherapy with Taxol and carbo and mastectomy and lymph node biopsy that occurred on March 27, 2022.  Dr. Lindi Adie myself reviewed [facility]'s pathology results with her which revealed no chemotherapy effect on the  breast cancer and continued T2N0 residual disease.  We reviewed Kadcyla as the treatment of choice at this point and recommended this to her.  We discussed side effects risks benefits in detail.  She is due for  her echocardiogram.  She has no cardiac symptoms so we will plan for this in 3 weeks since she did just undergo mastectomy.  She is healing from her mastectomy well and has follow-up with Dr. Brantley Stage on October 6.  We encouraged her to bring any of her post surgery questions to him for further discussion.  Samera and her daughter are in agreement with the treatment plan change and we will see her back in 1 week to get started.   All questions were answered. The patient knows to call the clinic with any problems, questions or concerns. We can certainly see the patient much sooner if necessary.  Wilber Bihari, NP 04/02/22 10:49 AM Medical Oncology and Hematology [facility] [place], [place] [address] Tel. [phone]    Fax. [phone]  Attending Note  I personally saw and examined Moishe Spice. The plan of care was discussed with her. I agree with the physical exam findings and assessment and plan as documented above. I performed the majority of the counseling and assessment and plan regarding this encounter Given the amount of residual disease after neoadjuvant chemotherapy with Taxol Herceptin, by the guidelines recommended switching her to Kadcyla.  She will receive Kadcyla every 3 weeks for 11 cycles. Return to clinic next week to start [place]. She is still healing and recovering from the recent surgery.  Signed Harriette Ohara, MD

## 2022-04-02 NOTE — Patient Instructions (Signed)
Ado-Trastuzumab Emtansine Injection What is this medication? ADO-TRASTUZUMAB EMTANSINE (ADD oh traz TOO zuh mab em TAN zine) treats breast cancer. It works by blocking a protein that causes cancer cells to grow and multiply. This helps to slow or stop the spread of cancer cells. This medicine may be used for other purposes; ask your health care provider or pharmacist if you have questions. COMMON BRAND NAME(S): Kadcyla What should I tell my care team before I take this medication? They need to know if you have any of these conditions: Heart failure Liver disease Low platelet levels Lung disease Tingling of the fingers or toes or other nerve disorder An unusual or allergic reaction to ado-trastuzumab emtansine, other medications, foods, dyes, or preservatives Pregnant or trying to get pregnant Breast-feeding How should I use this medication? This medication is infused into a vein. It is given by your care team in a hospital or clinic setting. Talk to your care team about the use of this medication in children. Special care may be needed. Overdosage: If you think you have taken too much of this medicine contact a poison control center or emergency room at once. NOTE: This medicine is only for you. Do not share this medicine with others. What if I miss a dose? Keep appointments for follow-up doses. It is important not to miss your dose. Call your care team if you are unable to keep an appointment. What may interact with this medication? Atazanavir Boceprevir Clarithromycin Dalfopristin; quinupristin Delavirdine Indinavir Isoniazid, INH Itraconazole Ketoconazole Nefazodone Nelfinavir Ritonavir Telaprevir Telithromycin Tipranavir Voriconazole This list may not describe all possible interactions. Give your health care provider a list of all the medicines, herbs, non-prescription drugs, or dietary supplements you use. Also tell them if you smoke, drink alcohol, or use illegal drugs.  Some items may interact with your medicine. What should I watch for while using this medication? This medication may make you feel generally unwell. This is not uncommon, as chemotherapy can affect healthy cells as well as cancer cells. Report any side effects. Continue your course of treatment even though you feel ill unless your care team tells you to stop. You may need blood work while taking this medication. This medication may increase your risk to bruise or bleed. Call your care team if you notice any unusual bleeding. Be careful brushing or flossing your teeth or using a toothpick because you may get an infection or bleed more easily. If you have any dental work done, tell your dentist you are receiving this medication. Talk to your care team if you may be pregnant. Serious birth defects can occur if you take this medication during pregnancy and for 7 months after the last dose. You will need a negative pregnancy test before starting this medication. Contraception is recommended while taking this medication and for 7 months after the last dose. Your care team can help you find the option that works for you. If your partner can get pregnant, use a condom during sex while taking this medication and for 4 months after the last dose. Do not breastfeed while taking this medication and for 7 months after the last dose. This medication may cause infertility. Talk to your care team if you are concerned with your fertility. What side effects may I notice from receiving this medication? Side effects that you should report to your care team as soon as possible: Allergic reactions--skin rash, itching, hives, swelling of the face, lips, tongue, or throat Bleeding--bloody or black, tar-like stools, vomiting   blood or brown material that looks like coffee grounds, red or dark brown urine, small red or purple spots on skin, unusual bruising or bleeding Dry cough, shortness of breath or trouble breathing Heart  failure--shortness of breath, swelling of the ankles, feet, or hands, sudden weight gain, unusual weakness or fatigue Infusion reactions--chest pain, shortness of breath or trouble breathing, feeling faint or lightheaded Liver injury--right upper belly pain, loss of appetite, nausea, light-colored stool, dark yellow or brown urine, yellowing skin or eyes, unusual weakness or fatigue Pain, tingling, or numbness in the hands or feet Painful swelling, warmth, or redness of the skin, blisters or sores at the infusion site Side effects that usually do not require medical attention (report to your care team if they continue or are bothersome): Constipation Fatigue Headache Muscle pain Nausea This list may not describe all possible side effects. Call your doctor for medical advice about side effects. You may report side effects to FDA at 1-800-FDA-1088. Where should I keep my medication? This medication is given in a hospital or clinic. It will not be stored at home. NOTE: This sheet is a summary. It may not cover all possible information. If you have questions about this medicine, talk to your doctor, pharmacist, or health care provider.  2023 Elsevier/Gold Standard (2021-11-09 00:00:00)  

## 2022-04-02 NOTE — Progress Notes (Signed)
DISCONTINUE OFF PATHWAY REGIMEN - Breast   [id number]:Paclitaxel + Trastuzumab:   A cycle is every 28 days:     Paclitaxel      Trastuzumab-xxxx      Trastuzumab-xxxx   **Always confirm dose/schedule in your pharmacy ordering system**  REASON: Other Reason PRIOR TREATMENT: Off Pathway: Paclitaxel + Trastuzumab TREATMENT RESPONSE: Progressive Disease (PD)  START ON PATHWAY REGIMEN - Breast     A cycle is every 21 days:     Ado-trastuzumab emtansine   **Always confirm dose/schedule in your pharmacy ordering system**  Patient Characteristics: Post-Neoadjuvant Therapy and Resection, HER2 Positive, ER Negative/Unknown, Residual Disease, Adjuvant Targeted Therapy After Neoadjuvant Chemo/Targeted Therapy Therapeutic Status: Post-Neoadjuvant Therapy and Resection Residual Invasive Disease Post-Neoadjuvant Therapy<= Yes ER Status: Negative (-) HER2 Status: Positive (+) PR Status: Negative (-) Intent of Therapy: Curative Intent, Discussed with Patient

## 2022-04-03 ENCOUNTER — Other Ambulatory Visit: Payer: Self-pay

## 2022-04-03 LAB — SURGICAL PATHOLOGY

## 2022-04-04 NOTE — Progress Notes (Addendum)
Patient Care Team: Raelene Bott, MD as PCP - General (Internal Medicine) Mauro Kaufmann, RN as Oncology Nurse Navigator Rockwell Germany, RN as Oncology Nurse Navigator Erroll Luna, MD as Consulting Physician (General Surgery) Nicholas Lose, MD as Consulting Physician (Hematology and Oncology) Eppie Gibson, MD as Attending Physician (Radiation Oncology)  DIAGNOSIS:  Encounter Diagnosis  Name Primary?   Malignant neoplasm of lower-inner quadrant of right breast of female, estrogen receptor negative ([place])     SUMMARY OF ONCOLOGIC HISTORY: Oncology History  Malignant neoplasm of lower-inner quadrant of right breast of female, estrogen receptor negative ([employer])  11/28/2021 Initial Diagnosis   Palpable right breast lump 2.6 cm with calcifications at 3 o'clock position, additional 1.3 cm lesion is an intramammary lymph node biopsy: Benign, axilla negative, biopsy of the breast lump: Grade 3 IDC ER 0%, PR 0%, Ki-67 98%, HER2 3+ positive   12/05/2021 Cancer Staging   Staging form: Breast, AJCC 8th Edition - Clinical: Stage IIA (cT2, cN0, cM0, G3, ER-, PR-, HER2+) - Signed by Nicholas Lose, MD on 12/05/2021 Stage prefix: Initial diagnosis Histologic grading system: 3 grade system   12/26/2021 - 03/12/2022 Chemotherapy   Patient is on Treatment Plan : BREAST Paclitaxel + Trastuzumab q7d / Trastuzumab q21d      Genetic Testing   [facility] CustomNext Panel was Negative. Report date is 12/18/2021.  The CustomNext-Cancer+RNAinsight panel offered by [facility] includes sequencing and rearrangement analysis for the following 47 genes:  APC, ATM, AXIN2, BARD1, BMPR1A, BRCA1, BRCA2, BRIP1, CDH1, CDK4, CDKN2A, CHEK2, CTNNA1, DICER1, EPCAM, GREM1, HOXB13, KIT, MEN1, MLH1, MSH2, MSH3, MSH6, MUTYH, NBN, NF1, NTHL1, PALB2, PDGFRA, PMS2, POLD1, POLE, PTEN, RAD50, RAD51C, RAD51D, SDHA, SDHB, SDHC, SDHD, SMAD4, SMARCA4, STK11, TP53, TSC1, TSC2, and VHL.  RNA data is routinely analyzed for use in variant  interpretation for all genes.   03/27/2022 Surgery   Right breast mastectomy: Invasive ductal carcinoma Nottingham grade 3 with no morphologic evidence of neoadjuvant chemotherapy effect negative for lymphovascular and perineural invasion the size noted was 4.2 x 3.7 x 3.5 cm margins were negative and 2 lymph nodes biopsied were negative. Cancer staging: ypT2 pN0 pM0   04/02/2022 - 04/02/2022 Chemotherapy   Patient is on Treatment Plan : BREAST Trastuzumab IV (8/6) or SQ (600) D1 q21d     04/08/2022 -  Chemotherapy   Patient is on Treatment Plan : BREAST ADO-Trastuzumab Emtansine (Kadcyla) q21d       CHIEF COMPLIANT: Follow-up cycle 1 Kadcyla  INTERVAL HISTORY: ADREONA BRAND is a 88 y.o. female is here because of recent diagnosis of right breast cancer. She presents to the clinic today for a follow-up. She complains of pain and also burns around the surgical site. Denies pain in breast.  She is going to start the new treatment today.   ALLERGIES:  is allergic to paclitaxel.  MEDICATIONS:  Current Outpatient Medications  Medication Sig Dispense Refill   acetaminophen (TYLENOL) 500 MG tablet Take 500 mg by mouth at bedtime.     acetaminophen (TYLENOL) 500 MG tablet Take 2 tablets (1,000 mg total) by mouth every 6 (six) hours as needed. 100 tablet 2   Ascorbic Acid (VITAMIN C WITH ROSE HIPS) 500 MG tablet Take 500 mg by mouth daily.     aspirin EC 81 MG tablet Take 81 mg by mouth every evening.     brimonidine (ALPHAGAN) 0.15 % ophthalmic solution Place 1 drop into the left eye 2 (two) times daily.     cholecalciferol (VITAMIN  D) 1000 UNITS tablet Take 1,000 Units by mouth daily.     cyanocobalamin (,VITAMIN B-12,) 1000 MCG/ML injection Inject 1,000 mcg into the muscle every 30 (thirty) days.     escitalopram (LEXAPRO) 10 MG tablet Take 5 mg by mouth at bedtime.     gabapentin (NEURONTIN) 300 MG capsule Take 300 mg by mouth at bedtime.     loperamide (IMODIUM) 2 MG capsule Take 1 capsule (2  mg total) by mouth as needed for diarrhea or loose stools. 30 capsule 0   Multiple Vitamin (MULTIVITAMIN WITH MINERALS) TABS tablet Take 1 tablet by mouth daily.     No current facility-administered medications for this visit.   Facility-Administered Medications Ordered in Other Visits  Medication Dose Route Frequency Provider Last Rate Last Admin   sodium chloride flush (NS) 0.9 % injection 10 mL  10 mL Intracatheter PRN Nicholas Lose, MD   10 mL at 04/08/22 1515    PHYSICAL EXAMINATION: ECOG PERFORMANCE STATUS: 1 - Symptomatic but completely ambulatory  Vitals:   04/08/22 0955  BP: 118/60  Pulse: 66  Resp: 18  Temp: (!) 97.2 F (36.2 C)  SpO2: 98%   Filed Weights   04/08/22 0955  Weight: 137 lb 1.6 oz (62.2 kg)      LABORATORY DATA:  I have reviewed the data as listed    Latest Ref Rng & Units 04/08/2022    9:40 AM 03/12/2022   10:38 AM 03/05/2022   12:18 PM  CMP  Glucose 70 - 99 mg/dL 107  95  99   BUN 8 - 23 mg/dL 21  19  19    Creatinine 0.44 - 1.00 mg/dL 0.63  0.71  0.56   Sodium 135 - 145 mmol/L 140  137  138   Potassium 3.5 - 5.1 mmol/L 4.3  4.4  3.9   Chloride 98 - 111 mmol/L 107  104  105   CO2 22 - 32 mmol/L 28  29  29    Calcium 8.9 - 10.3 mg/dL 9.1  9.5  9.6   Total Protein 6.5 - 8.1 g/dL 6.1  6.4  6.5   Total Bilirubin 0.3 - 1.2 mg/dL 0.7  0.8  0.9   Alkaline Phos 38 - 126 U/L 65  63  63   AST 15 - 41 U/L 19  17  18    ALT 0 - 44 U/L 14  16  17      Lab Results  Component Value Date   WBC 4.9 04/08/2022   HGB 10.6 (L) 04/08/2022   HCT 31.9 (L) 04/08/2022   MCV 96.7 04/08/2022   PLT 221 04/08/2022   NEUTROABS 2.8 04/08/2022    ASSESSMENT & PLAN:  Malignant neoplasm of lower-inner quadrant of right breast of female, estrogen receptor negative ([place]) 11/28/2021:Palpable right breast lump 2.6 cm with calcifications at 3 o'clock position, additional 1.3 cm lesion is an intramammary lymph node biopsy: Benign, axilla negative, biopsy of the breast lump:  Grade 3 IDC ER 0%, PR 0%, Ki-67 98%, HER2 3+ positive   Treatment plan: 1.  Neoadjuvant chemotherapy with Taxol Herceptin weekly x12 followed by Kadcyla maintenance 2. Rt Mastectomy with sentinel lymph node biopsy: 03/27/2022: 4.2 cm grade 3 IDC with negative margins 0/2 lymph nodes negative, ER 0%, PR 0%, Ki-67 80%, HER2 3+ positive 3.  +/- Adjuvant radiation ----------------------------------------------------------------------------------------------------------------- Current treatment: Kadcyla cycle 1 Because she had significant residual disease and did not show much chemotherapy effect, we recommend switching the treatment to Kadcyla.  She  will receive her first dose of treatment today.  We will request an appointment with radiation. Return to clinic every 3 weeks for Kadcyla    No orders of the defined types were placed in this encounter.  The patient has a good understanding of the overall plan. she agrees with it. she will call with any problems that may develop before the next visit here. Total time spent: 30 mins including face to face time and time spent for planning, charting and co-ordination of care   Harriette Ohara, MD 04/08/22    I Gardiner Coins am scribing for Dr. Lindi Adie  I have reviewed the above documentation for accuracy and completeness, and I agree with the above.

## 2022-04-05 ENCOUNTER — Encounter: Payer: Self-pay | Admitting: Hematology and Oncology

## 2022-04-05 ENCOUNTER — Encounter: Payer: Self-pay | Admitting: *Deleted

## 2022-04-05 NOTE — Progress Notes (Signed)
Returned call to patient from voicemail left regarding grant balance. Provided information to her. She verbalized understanding.  She has my card for any additional financial questions or concerns.

## 2022-04-08 ENCOUNTER — Other Ambulatory Visit: Payer: Self-pay

## 2022-04-08 ENCOUNTER — Encounter: Payer: Self-pay | Admitting: *Deleted

## 2022-04-08 ENCOUNTER — Inpatient Hospital Stay: Payer: Medicare Other | Attending: Hematology and Oncology

## 2022-04-08 ENCOUNTER — Inpatient Hospital Stay (HOSPITAL_BASED_OUTPATIENT_CLINIC_OR_DEPARTMENT_OTHER): Payer: Medicare Other | Admitting: Hematology and Oncology

## 2022-04-08 ENCOUNTER — Other Ambulatory Visit: Payer: Self-pay | Admitting: *Deleted

## 2022-04-08 ENCOUNTER — Inpatient Hospital Stay: Payer: Medicare Other

## 2022-04-08 DIAGNOSIS — C50311 Malignant neoplasm of lower-inner quadrant of right female breast: Secondary | ICD-10-CM

## 2022-04-08 DIAGNOSIS — Z5111 Encounter for antineoplastic chemotherapy: Secondary | ICD-10-CM | POA: Insufficient documentation

## 2022-04-08 DIAGNOSIS — Z79899 Other long term (current) drug therapy: Secondary | ICD-10-CM | POA: Diagnosis not present

## 2022-04-08 DIAGNOSIS — Z5112 Encounter for antineoplastic immunotherapy: Secondary | ICD-10-CM | POA: Insufficient documentation

## 2022-04-08 DIAGNOSIS — Z171 Estrogen receptor negative status [ER-]: Secondary | ICD-10-CM | POA: Diagnosis not present

## 2022-04-08 DIAGNOSIS — Z95828 Presence of other vascular implants and grafts: Secondary | ICD-10-CM

## 2022-04-08 LAB — CBC WITH DIFFERENTIAL (CANCER CENTER ONLY)
Abs Immature Granulocytes: 0.02 10*3/uL (ref 0.00–0.07)
Basophils Absolute: 0 10*3/uL (ref 0.0–0.1)
Basophils Relative: 1 %
Eosinophils Absolute: 0.1 10*3/uL (ref 0.0–0.5)
Eosinophils Relative: 3 %
HCT: 31.9 % — ABNORMAL LOW (ref 36.0–46.0)
Hemoglobin: 10.6 g/dL — ABNORMAL LOW (ref 12.0–15.0)
Immature Granulocytes: 0 %
Lymphocytes Relative: 31 %
Lymphs Abs: 1.5 10*3/uL (ref 0.7–4.0)
MCH: 32.1 pg (ref 26.0–34.0)
MCHC: 33.2 g/dL (ref 30.0–36.0)
MCV: 96.7 fL (ref 80.0–100.0)
Monocytes Absolute: 0.4 10*3/uL (ref 0.1–1.0)
Monocytes Relative: 8 %
Neutro Abs: 2.8 10*3/uL (ref 1.7–7.7)
Neutrophils Relative %: 57 %
Platelet Count: 221 10*3/uL (ref 150–400)
RBC: 3.3 MIL/uL — ABNORMAL LOW (ref 3.87–5.11)
RDW: 13.2 % (ref 11.5–15.5)
WBC Count: 4.9 10*3/uL (ref 4.0–10.5)
nRBC: 0 % (ref 0.0–0.2)

## 2022-04-08 LAB — CMP (CANCER CENTER ONLY)
ALT: 14 U/L (ref 0–44)
AST: 19 U/L (ref 15–41)
Albumin: 3.8 g/dL (ref 3.5–5.0)
Alkaline Phosphatase: 65 U/L (ref 38–126)
Anion gap: 5 (ref 5–15)
BUN: 21 mg/dL (ref 8–23)
CO2: 28 mmol/L (ref 22–32)
Calcium: 9.1 mg/dL (ref 8.9–10.3)
Chloride: 107 mmol/L (ref 98–111)
Creatinine: 0.63 mg/dL (ref 0.44–1.00)
GFR, Estimated: >60 mL/min (ref >60)
Glucose, Bld: 107 mg/dL — ABNORMAL HIGH (ref 70–99)
Potassium: 4.3 mmol/L (ref 3.5–5.1)
Sodium: 140 mmol/L (ref 135–145)
Total Bilirubin: 0.7 mg/dL (ref 0.3–1.2)
Total Protein: 6.1 g/dL — ABNORMAL LOW (ref 6.5–8.1)

## 2022-04-08 MED ORDER — SODIUM CHLORIDE 0.9 % IV SOLN
3.6000 mg/kg | Freq: Once | INTRAVENOUS | Status: AC
  Administered 2022-04-08: 220 mg via INTRAVENOUS
  Filled 2022-04-08: qty 8

## 2022-04-08 MED ORDER — SODIUM CHLORIDE 0.9% FLUSH
10.0000 mL | INTRAVENOUS | Status: DC | PRN
  Administered 2022-04-08: 10 mL

## 2022-04-08 MED ORDER — SODIUM CHLORIDE 0.9 % IV SOLN: Freq: Once | INTRAVENOUS | Status: AC

## 2022-04-08 MED ORDER — HEPARIN SOD (PORK) LOCK FLUSH 100 UNIT/ML IV SOLN: 500.0000 [IU] | Freq: Once | INTRAVENOUS | Status: DC

## 2022-04-08 MED ORDER — DIPHENHYDRAMINE HCL 25 MG PO CAPS
25.0000 mg | ORAL_CAPSULE | Freq: Once | ORAL | Status: AC
  Administered 2022-04-08: 25 mg via ORAL
  Filled 2022-04-08: qty 1

## 2022-04-08 MED ORDER — HEPARIN SOD (PORK) LOCK FLUSH 100 UNIT/ML IV SOLN
500.0000 [IU] | Freq: Once | INTRAVENOUS | Status: AC | PRN
  Administered 2022-04-08: 500 [IU]

## 2022-04-08 MED ORDER — SODIUM CHLORIDE 0.9% FLUSH: 10.0000 mL | Freq: Once | INTRAVENOUS | Status: DC

## 2022-04-08 MED ORDER — ACETAMINOPHEN 325 MG PO TABS
650.0000 mg | ORAL_TABLET | Freq: Once | ORAL | Status: AC
  Administered 2022-04-08: 650 mg via ORAL
  Filled 2022-04-08: qty 2

## 2022-04-08 MED ORDER — ALTEPLASE 2 MG IJ SOLR: 2.0000 mg | Freq: Once | INTRAMUSCULAR | Status: DC

## 2022-04-08 MED ORDER — SODIUM CHLORIDE 0.9% FLUSH
10.0000 mL | Freq: Once | INTRAVENOUS | Status: AC
  Administered 2022-04-08: 10 mL

## 2022-04-08 MED ORDER — PROCHLORPERAZINE MALEATE 10 MG PO TABS
10.0000 mg | ORAL_TABLET | Freq: Once | ORAL | Status: AC
  Administered 2022-04-08: 10 mg via ORAL
  Filled 2022-04-08: qty 1

## 2022-04-08 NOTE — Assessment & Plan Note (Addendum)
11/28/2021:Palpable right breast lump 2.6 cm with calcifications at 3 o'clock position, additional 1.3 cm lesion is an intramammary lymph node biopsy: Benign, axilla negative, biopsy of the breast lump: Grade 3 IDC ER 0%, PR 0%, Ki-67 98%, HER2 3+ positive  Treatment plan: 1.Neoadjuvant chemotherapy with Taxol Herceptin weekly x12 followed by Kadcyla maintenance 2.Rt Mastectomy with sentinel lymph node biopsy: 03/27/2022: 4.2 cm grade 3 IDC with negative margins 0/2 lymph nodes negative, ER 0%, PR 0%, Ki-67 80%, HER2 3+ positive 3.+/- Adjuvant radiation ----------------------------------------------------------------------------------------------------------------- Current treatment: Kadcyla cycle 1 Because she had significant residual disease and did not show much chemotherapy effect, we recommend switching the treatment to Kadcyla.  She will receive her first dose of treatment today.  Return to clinic every 3 weeks for Kadcyla

## 2022-04-08 NOTE — Patient Instructions (Signed)
[facility] MEDICAL [facility]COLOGY  Discharge Instructions: Thank you for choosing [facility] to provide your oncology and hematology care.   If you have a lab appointment with the Cancer Center, please go directly to the Cancer Center and check in at the registration area.   Wear comfortable clothing and clothing appropriate for easy access to any Portacath or PICC line.   We strive to give you quality time with your provider. You may need to reschedule your appointment if you arrive late (15 or more minutes).  Arriving late affects you and other patients whose appointments are after yours.  Also, if you miss three or more appointments without notifying the office, you may be dismissed from the clinic at the provider's discretion.      For prescription refill requests, have your pharmacy contact our office and allow 72 hours for refills to be completed.    Today you received the following chemotherapy and/or immunotherapy agents kadcyla      To help prevent nausea and vomiting after your treatment, we encourage you to take your nausea medication as directed.  BELOW ARE SYMPTOMS THAT SHOULD BE REPORTED IMMEDIATELY: *FEVER GREATER THAN 100.4 F (38 C) OR HIGHER *CHILLS OR SWEATING *NAUSEA AND VOMITING THAT IS NOT CONTROLLED WITH YOUR NAUSEA MEDICATION *UNUSUAL SHORTNESS OF BREATH *UNUSUAL BRUISING OR BLEEDING *URINARY PROBLEMS (pain or burning when urinating, or frequent urination) *BOWEL PROBLEMS (unusual diarrhea, constipation, pain near the anus) TENDERNESS IN MOUTH AND THROAT WITH OR WITHOUT PRESENCE OF ULCERS (sore throat, sores in mouth, or a toothache) UNUSUAL RASH, SWELLING OR PAIN  UNUSUAL VAGINAL DISCHARGE OR ITCHING   Items with * indicate a potential emergency and should be followed up as soon as possible or go to the Emergency Department if any problems should occur.  Please show the CHEMOTHERAPY ALERT CARD or IMMUNOTHERAPY ALERT CARD at check-in to the  Emergency Department and triage nurse.  Should you have questions after your visit or need to cancel or reschedule your appointment, please contact [facility]  Dept: [phone]  and follow the prompts.  Office hours are 8:00 a.m. to 4:30 p.m. Monday - Friday. Please note that voicemails left after 4:00 p.m. may not be returned until the following business day.  We are closed weekends and major holidays. You have access to a nurse at all times for urgent questions. Please call the main number to the clinic Dept: [phone] and follow the prompts.   For any non-urgent questions, you may also contact your provider using MyChart. We now offer e-Visits for anyone 18 and older to request care online for non-urgent symptoms. For details visit [email or web address].   Also download the MyChart app! Go to the app store, search "MyChart", open the app, select [facility], and log in with your MyChart username and password.  Masks are optional in the cancer centers. If you would like for your care team to wear a mask while they are taking care of you, please let them know. You may have one support person who is at least 86 years old accompany you for your appointments. 

## 2022-04-09 ENCOUNTER — Encounter: Payer: Self-pay | Admitting: Hematology and Oncology

## 2022-04-10 ENCOUNTER — Encounter: Payer: Self-pay | Admitting: *Deleted

## 2022-04-12 ENCOUNTER — Other Ambulatory Visit (HOSPITAL_COMMUNITY): Payer: Medicare Other

## 2022-04-15 ENCOUNTER — Ambulatory Visit
Admission: RE | Admit: 2022-04-15 | Discharge: 2022-04-15 | Disposition: A | Discharge status: Home / Self Care | Payer: Self-pay | Source: Ambulatory Visit | Attending: Radiation Oncology | Admitting: Radiation Oncology

## 2022-04-15 ENCOUNTER — Inpatient Hospital Stay
Admission: RE | Admit: 2022-04-15 | Discharge: 2022-04-15 | Disposition: A | Discharge status: Home / Self Care | Payer: Self-pay | Source: Ambulatory Visit | Attending: Radiation Oncology | Admitting: Radiation Oncology

## 2022-04-15 ENCOUNTER — Other Ambulatory Visit: Payer: Self-pay | Admitting: Radiation Oncology

## 2022-04-15 ENCOUNTER — Other Ambulatory Visit (HOSPITAL_COMMUNITY): Payer: Medicare Other

## 2022-04-15 DIAGNOSIS — Z171 Estrogen receptor negative status [ER-]: Secondary | ICD-10-CM

## 2022-04-15 NOTE — Progress Notes (Signed)
Location of Breast Cancer: Rt breast  Histology per Pathology Report: 03/27/2022 SURGICAL PATHOLOGY    Reason for Addendum #1:  Breast Biomarker Results   Clinical History: right breast cancer (cm)      FINAL MICROSCOPIC DIAGNOSIS:   A. LYMPH NODE, RIGHT, SENTINEL, EXCISION:  -  1 lymph node, negative for malignancy (0/1), multiple levels  examined..   B. LYMPH NODE, RIGHT, SENTINEL, EXCISION:  -  1 lymph node, negative for malignancy (0/1), multiple levels  examined..   C. BREAST, RIGHT, MASTECTOMY:  -  Invasive ductal carcinoma (NOS)/invasive carcinoma, NST with  geographic necrosis, Nottingham histologic score 3 of 3 (tubular score  3/3; nuclear pleomorphism 3/3, mitotic index 3/3)  -  No morphologic evidence of neoadjuvant chemotherapy effect.  -  Negative for lymphovascular and perineural invasion  -  Size - 4.2 x 3.7 x 3.5 cm.  -  Margins negative; closest margin posterior at 10 mm all others  greater than 10 mm  pT2 pN0 pM n/a   ONCOLOGY TABLE:   INVASIVE CARCINOMA OF THE BREAST:  Resection   Procedure: Simple mastectomy with right axillary sentinel lymph node  Specimen Laterality: Right  Histologic Type: Invasive ductal (NOS)/invasive carcinoma, NST  Histologic Grade:       Glandular (Acinar)/Tubular Differentiation: 3 of 3       Nuclear Pleomorphism: 3 of 3       Mitotic Rate: 3 of 3       Overall Grade: 3 of 3  Tumor Size: 4.2 x 3.7 x 3.5 cm  Ductal Carcinoma In Situ: Not identified  Tumor Extent: Limited to breast parenchyma  Treatment Effect in the Breast: History of neoadjuvant chemotherapy  noted; there is no morphologic evidence of chemotherapy effect margins:  All margins negative for invasive carcinoma       Distance from Closest Margin (mm): 10 mm       Specify Closest Margin (required only if <38m): Posterior  DCIS Margins:       Distance from Closest Margin (mm): N/A       Specify Closest Margin (required only if <16m: N/A  Regional Lymph  Nodes:       Number of Lymph Nodes Examined: 2       Number of Sentinel Nodes Examined: 2       Number of Lymph Nodes with Macrometastases (>2 mm): 0       Number of Lymph Nodes with Micrometastases: 0       Number of Lymph Nodes with Isolated Tumor Cells (=0.2 mm or =200  cells): 0       Size of Largest Metastatic Deposit (mm): N/A       Extranodal Extension: N/A  Distant Metastasis:       Distant Site(s) Involved: N/A  Breast Biomarker Testing Performed on Previous Biopsy:       Testing Performed on Case Number: [id number]             Estrogen Receptor: Negative, 0%             Progesterone Receptor: Negative, 0%             HER2: Positive 3+             Ki-67: 98%  Pathologic Stage Classification (pTNM, AJCC 8th Edition): pT2, pN0  Representative Tumor Block: C3  Comment(s): None   ([id number])    INTRAOPERATIVE DIAGNOSIS:  A.  Right sentinel lymph node: "Negative for metastatic carcinoma.  Dr.  PaSaralyn Pilar  agrees."  Intraoperative diagnosis rendered by Dr. Chauncey Cruel at 2:26 PM on  03/28/2022.   GROSS DESCRIPTION:   A: Received fresh for rapid intraoperative consult is a 2.5 x 1.6 x 1.3  cm rubbery tan-yellow to blue-tinged ovoid nodule.  Representative  sections of the nodule are submitted for frozen section in 1 block.  The  specimen is entirely submitted in 3 blocks.  1 = sections submitted for frozen section  2, 3 = remainder of nodule   B: Received fresh is a 1.9 x 1 x 1.2 cm rubbery tan-yellow to  blue-tinged nodule.  The specimen is sectioned and entirely submitted in  2 cassettes.   C: Specimen: Right simple mastectomy, received fresh.  The specimen is  placed in formalin at 3:00 PM on 03/28/2022.  Specimen integrity (intact/disrupted): Intact  Weight: 459 g  Size: 16.5 x 14 x 6 cm with a separate 7 x 5 x 2 cm portion of  fibroadipose tissue.  Skin: There is an attached 19 x 8.5 cm ellipse of skin with a central  retracted nipple.  There is a suture attached  identifying superior.  Tumor/cavity: In the mid lateral breast there is a 4.2 x 3.7 x 3.5 cm  indurated nodular mass with ill-defined borders.  The cut surface of the  mass is tan-white to hemorrhagic.  There is a heart clip present in the  mass.  The mass is located 1 cm from the deep margin.  Uninvolved parenchyma: There is a barbell clip in the retroareolar area  at 1:00 with surrounding hemorrhage.  A discrete mass is not identified.  In the mid lower breast there is a venus clip present without a discrete  mass identified.  The remainder of the breast tissue consist of soft  yellow adipose tissue and white fibrous tissue.  Prognostic indicators: Obtained from paraffin blocks if needed.  Lymph nodes: There are no lymph nodes present.  Block summary:  12 blocks submitted  1 = nipple  2 = mass with deep margin  3-5 = mass  6 = sections at barbell clip  7, 8 = sections at venus clip  9 = upper lateral  10 = lower lateral  11 = upper medial   Receptor Status: ER(0%), PR (0%), Her2-neu (Positive 3t), Ki-67(98%)  Did patient present with symptoms (if so, please note symptoms) or was this found on screening mammography?: Patient states that she did self check and followed up with mammogram  Past/Anticipated interventions by surgeon, if any:03/27/2022  RIGHT SIMPLE MASTECTOMY WITH RIGHT SENTINEL LYMPH NODE BIOPSY Dr.Cornett  Past/Anticipated interventions by medical oncology, if any:  1.  Neoadjuvant chemotherapy with Taxol Herceptin weekly x12 followed by Kadcyla maintenance 2. Breast conserving surgery with sentinel lymph node biopsy: 03/27/2022: 4.2 cm grade 3 IDC with negative margins 0/2 lymph nodes negative, ER 0%, PR 0%, Ki-67 80%, HER2 3+ positive 3.  Adjuvant radiation Current treatment: Kadcyla cycle 1 Because she had significant residual disease and did not show much chemotherapy effect, we recommend switching the treatment to Kadcyla.  She will receive her first dose of  treatment today.   We will request an appointment with radiation. Return to clinic every 3 weeks for Kadcyla    Lymphedema issues, if any:  some  Pain issues, if any:  no  SAFETY ISSUES: Prior radiation? no Pacemaker/ICD? no Possible current pregnancy?no Is the patient on methotrexate? no  Current Complaints / other details:   Vitals:   04/22/22 0807  BP: 135/73  Pulse: 73  Resp: 18  Temp: 97.7 F (36.5 C)  TempSrc: Oral  SpO2: 95%  Weight: 61.5 kg  Height: 5' 5.5" (1.664 m)

## 2022-04-19 ENCOUNTER — Other Ambulatory Visit (HOSPITAL_COMMUNITY): Payer: Medicare Other

## 2022-04-19 DIAGNOSIS — Z171 Estrogen receptor negative status [ER-]: Secondary | ICD-10-CM | POA: Diagnosis not present

## 2022-04-19 DIAGNOSIS — C50311 Malignant neoplasm of lower-inner quadrant of right female breast: Secondary | ICD-10-CM | POA: Diagnosis not present

## 2022-04-21 NOTE — Progress Notes (Signed)
Radiation Oncology         [phone] ________________________________  Name: Sydney Key MRN: [id number]  Date: 04/22/2022  DOB: 1931/07/08  Follow-Up Visit Note  Outpatient  CC: Raelene Bott, MD  Nicholas Lose, MD  Diagnosis:      ICD-10-CM   1. Malignant neoplasm of lower-inner quadrant of right breast of female, estrogen receptor negative ([place])  C50.311    Z17.1      S/p neoadjuvant chemotherapy without a treatment response, mastectomy, and SLN mapping: Right Breast LIQ, Invasive Ductal Carcinoma, ER- / PR- / Her2+, Grade 3, clean margins and negative nodes    Cancer Staging  Malignant neoplasm of lower-inner quadrant of right breast of female, estrogen receptor negative ([place]) Staging form: Breast, AJCC 8th Edition - Clinical: Stage IIA (cT2, cN0, cM0, G3, ER-, PR-, HER2+) - Signed by Nicholas Lose, MD on 12/05/2021  pT2 pN0  CHIEF COMPLAINT: Here to discuss management of right breast cancer  Narrative:  The patient returns today for follow-up.     Since breast clinic consultation date of 12/05/21, she underwent the following imaging (dates and results as follows):  -- Bilateral breast MRI on 12/10/21 showed the irregular rim enhancing mass in the central medial right breast measuring 2.8 x 3.1 x 3.1 cm (consistent with the biopsy proven malignancy). An abnormal non-mass enhancement was also appreciated extending anteriorly towards, and involving the nipple. MRI otherwise showed no abnormal appearing lymph nodes bilaterally, and no evidence of malignancy in the left breast.    Genetic testing on her consultation date showed showed no clinically significant variants detected by BRCAplus or +RNAinsight testing.  Breast or nodal biopsies, since consultation, involved (dates and results as follows): Biopsy of the 1 o'clock right breast on 12/21/21 showed benign breast tissue with large cystically dilated ducts, wall fibrosis, and focal calcifications consistent with duct  ectasia.   Systemic therapy, if applicable, involved (dates and therapy as follows): the patient has been treated with neoadjuvant chemotherapy consisting of Taxol Herceptin weekly x12 from 12/26/21 through 03/12/22 under the care of Dr. Lindi Adie. Chemo toxicities reported by the patient throughout the course of systemic treatment included diarrhea, chemo-induced anemia, and leukopenia (stable).   Bilateral breast MRI on 03/13/22 did not show a good treatment response, demonstrated by an increase in size of the biopsy-proven malignancy in the medial anterior right breast, measuring 4.9 cm, previously 3.1 cm. Imaging also showed an increase in size of a linear non mass enhancement extending anterior and posterior to the mass spanning an area measuring 7 cm. The enhancement was also seen to extend to the nipple, and possibly enhance within the nipple/areola. MRI otherwise showed no abnormal appearing lymph nodes or evidence of malignancy in the left breast.  Of note she had benign appearing masses in her liver  Given an increase in size of the tumor despite systemic treatment, Dr. Brantley Stage advised proceeding with a right mastectomy and SLN biopsies .   The patient opted to proceed with a right simple mastectomy with sentinel lymph node mapping on 03/27/22 under the care of Dr. Brantley Stage. Pathology from the procedure revealed: tumor size of 4.2 x 3.7 x 3.5 cm; histology of grade 3 invasive ductal carcinoma (negative for LVI or PNI); all margins negative for invasive disease; margin status to invasive disease of 10 mm from the posterior margin; nodal status of 2/2 right axillary SLN excisions negative for carcinoma;  ER status: negative; PR status negative; Proliferation marker Ki67 at 80%; Her2 status positive;  Grade 3.  Following discussion with Dr. Lindi Adie on 04/02/22, the patient consented to proceed with Kadcyla every 3 weeks for 11 cycles. She received her first infusion on 04/08/22.   During a post-op  follow-up with [facility] on 04/12/22, the patient was noted to have some erythema and mild tenderness to palpation at the mastectomy incision site. She was prescribed a 10 day course of doxycycline with improvement of erythema noted during her most recent follow-up visit with Dr. Brantley Stage on 04/19/22. She was also instructed to continue wearing her binder for 2 more weeks.   Symptomatically, the patient reports:  Lymphedema issues, if any:  some  Pain issues, if any:  no  SAFETY ISSUES: Prior radiation? no Pacemaker/ICD? no Possible current pregnancy?no Is the patient on methotrexate? no        ALLERGIES:  is allergic to paclitaxel.  Meds: Current Outpatient Medications  Medication Sig Dispense Refill   acetaminophen (TYLENOL) 500 MG tablet Take 500 mg by mouth at bedtime.     acetaminophen (TYLENOL) 500 MG tablet Take 2 tablets (1,000 mg total) by mouth every 6 (six) hours as needed. 100 tablet 2   Ascorbic Acid (VITAMIN C WITH ROSE HIPS) 500 MG tablet Take 500 mg by mouth daily.     aspirin EC 81 MG tablet Take 81 mg by mouth every evening.     brimonidine (ALPHAGAN) 0.15 % ophthalmic solution Place 1 drop into the left eye 2 (two) times daily.     cholecalciferol (VITAMIN D) 1000 UNITS tablet Take 1,000 Units by mouth daily.     cyanocobalamin (,VITAMIN B-12,) 1000 MCG/ML injection Inject 1,000 mcg into the muscle every 30 (thirty) days.     escitalopram (LEXAPRO) 10 MG tablet Take 5 mg by mouth at bedtime.     gabapentin (NEURONTIN) 300 MG capsule Take 300 mg by mouth at bedtime.     loperamide (IMODIUM) 2 MG capsule Take 1 capsule (2 mg total) by mouth as needed for diarrhea or loose stools. 30 capsule 0   Multiple Vitamin (MULTIVITAMIN WITH MINERALS) TABS tablet Take 1 tablet by mouth daily.     No current facility-administered medications for this encounter.    Physical Findings:  vitals were not taken for this visit. .     General: Alert and oriented, in no acute  distress HEENT: Head is normocephalic. Extraocular movements are intact.  Musculoskeletal: Good range of motion in right shoulder.  Ambulatory.  Well-nourished. Neurologic:  Speech is fluent.  Psychiatric: Judgment and insight are intact. Affect is appropriate. Breast exam reveals satisfactory healing of right mastectomy scar. ECOG = 1  0 - Asymptomatic (Fully active, able to carry on all predisease activities without restriction)  1 - Symptomatic but completely ambulatory (Restricted in physically strenuous activity but ambulatory and able to carry out work of a light or sedentary nature. For example, light housework, office work)  2 - Symptomatic, <50% in bed during the day (Ambulatory and capable of all self care but unable to carry out any work activities. Up and about more than 50% of waking hours)  3 - Symptomatic, >50% in bed, but not bedbound (Capable of only limited self-care, confined to bed or chair 50% or more of waking hours)  4 - Bedbound (Completely disabled. Cannot carry on any self-care. Totally confined to bed or chair)  5 - Death   Eustace Pen MM, Creech RH, Tormey DC, et al. [phone]). "Toxicity and response criteria of the [facility]". [place]  Oncol. 5 (6): 649-55  Lab Findings: Lab Results  Component Value Date   WBC 4.9 04/08/2022   HGB 10.6 (L) 04/08/2022   HCT 31.9 (L) 04/08/2022   MCV 96.7 04/08/2022   PLT 221 04/08/2022    @LASTCHEMISTRY @  Radiographic Findings: No results found.  Impression/Plan: Right breast cancer  We discussed adjuvant radiotherapy today.  Given the aggressive nature of her breast cancer and the fact that it was refractory to systemic therapy, the tumor board was concerned about her risk of local progression after mastectomy.  We discussed the pros and cons of adjuvant radiation therapy today.  I estimated that she has about a 20% risk of local recurrence over the next decade without radiation therapy and this  risk could be brought down to about 6% if she undergoes radiation therapy.  Radiation therapy may have a small benefit in terms of improving her overall survival as well.  Radiation therapy would be given to the right chest wall only.  This would take 5 weeks of treatment given Monday through Friday.    I reviewed the logistics, benefits, risks, and potential side effects of this treatment in detail with the patient and her daughter. Risks may include but not necessary be limited to acute and late injury tissue in the radiation fields such as skin irritation (change in color/pigmentation, itching, dryness, pain, peeling). She may experience fatigue. We also discussed possible risk of long term cosmetic changes or scar tissue. There is also a smaller risk for lung toxicity,   lymphedema, musculoskeletal changes, rib fragility or induction of a second malignancy, late chronic non-healing soft tissue wound.    The patient asked good questions which I answered to her satisfaction.  She would like to proceed with treatment. A consent form has been signed and placed in her chart.  We will proceed with treatment planning today and start her treatment in 2 weeks given other commitments that the patient's family has before then.    On date of service, in total, I spent 35 minutes on this encounter. Patient was seen in person.  _____________________________________   Eppie Gibson, MD  This document serves as a record of services personally performed by Eppie Gibson, MD. It was created on her behalf by Roney Mans, a trained medical scribe. The creation of this record is based on the scribe's personal observations and the provider's statements to them. This document has been checked and approved by the attending provider.

## 2022-04-22 ENCOUNTER — Ambulatory Visit: Payer: Medicare Other | Attending: Surgery | Admitting: Physical Therapy

## 2022-04-22 ENCOUNTER — Encounter: Payer: Self-pay | Admitting: Physical Therapy

## 2022-04-22 ENCOUNTER — Encounter: Payer: Self-pay | Admitting: Radiation Oncology

## 2022-04-22 ENCOUNTER — Ambulatory Visit
Admission: RE | Admit: 2022-04-22 | Discharge: 2022-04-22 | Disposition: A | Discharge status: Home / Self Care | Payer: Medicare Other | Source: Ambulatory Visit | Attending: Radiation Oncology | Admitting: Radiation Oncology

## 2022-04-22 ENCOUNTER — Other Ambulatory Visit: Payer: Self-pay

## 2022-04-22 VITALS — BP 135/73 | HR 73 | Temp 97.7°F | Resp 18 | Ht 65.5 in | Wt 135.5 lb

## 2022-04-22 DIAGNOSIS — Z171 Estrogen receptor negative status [ER-]: Secondary | ICD-10-CM | POA: Insufficient documentation

## 2022-04-22 DIAGNOSIS — C50311 Malignant neoplasm of lower-inner quadrant of right female breast: Secondary | ICD-10-CM | POA: Insufficient documentation

## 2022-04-22 DIAGNOSIS — Z510 Encounter for antineoplastic radiation therapy: Secondary | ICD-10-CM | POA: Insufficient documentation

## 2022-04-22 DIAGNOSIS — R293 Abnormal posture: Secondary | ICD-10-CM | POA: Diagnosis not present

## 2022-04-22 DIAGNOSIS — Z7982 Long term (current) use of aspirin: Secondary | ICD-10-CM | POA: Insufficient documentation

## 2022-04-22 DIAGNOSIS — Z483 Aftercare following surgery for neoplasm: Secondary | ICD-10-CM | POA: Diagnosis not present

## 2022-04-22 DIAGNOSIS — C50211 Malignant neoplasm of upper-inner quadrant of right female breast: Secondary | ICD-10-CM | POA: Diagnosis not present

## 2022-04-22 NOTE — Therapy (Signed)
OUTPATIENT PHYSICAL THERAPY BREAST CANCER POST OP FOLLOW UP   Patient Name: Sydney Key MRN: [id number] DOB:03/02/1931, 88 y.o., female Today's Date: 04/22/2022   PT End of Session - 04/22/22 1204     Visit Number 2    Number of Visits 2    PT Start Time 1201    PT Stop Time 1300    PT Time Calculation (min) 59 min    Activity Tolerance Patient tolerated treatment well    Behavior During Therapy [facility] for tasks assessed/performed             Past Medical History:  Diagnosis Date   Anxiety    Cancer ([place]) 2023   Right breast cancer   History of kidney stones    Past Surgical History:  Procedure Laterality Date   ABDOMINAL HYSTERECTOMY  age 11   COLONOSCOPY N/A 05/14/2013   Procedure: COLONOSCOPY;  Surgeon: Cleotis Nipper, MD;  Location: [facility];  Service: Endoscopy;  Laterality: N/A;   EYE SURGERY Left 07/09/2011   retina detachment   FOOT SURGERY Right    "shaved down" bone on Great toe   HOT HEMOSTASIS N/A 05/14/2013   Procedure: HOT HEMOSTASIS (ARGON PLASMA COAGULATION/BICAP);  Surgeon: Cleotis Nipper, MD;  Location: [facility];  Service: Endoscopy;  Laterality: N/A;   PORTACATH PLACEMENT Right 01/04/2022   Procedure: PORT PLACEMENT WITH ULTRASOUND GUIDANCE;  Surgeon: Erroll Luna, MD;  Location: [facility];  Service: General;  Laterality: Right;   SIMPLE MASTECTOMY WITH AXILLARY SENTINEL NODE BIOPSY Right 03/27/2022   Procedure: RIGHT SIMPLE MASTECTOMY WITH RIGHT SENTINEL LYMPH NODE BIOPSY;  Surgeon: Erroll Luna, MD;  Location: [place];  Service: General;  Laterality: Right;   Patient Active Problem List   Diagnosis Date Noted   Port-A-Cath in place 01/09/2022   Genetic testing 12/21/2021   Family history of breast cancer 12/06/2021   Malignant neoplasm of lower-inner quadrant of right breast of female, estrogen receptor negative ([place]) 12/04/2021    REFERRING PROVIDER: Dr. Erroll Luna  REFERRING DIAG: Right breast  cancer  THERAPY DIAG:  Malignant neoplasm of upper-inner quadrant of right breast in female, estrogen receptor negative ([place])  Abnormal posture  Aftercare following surgery for neoplasm  Rationale for Evaluation and Treatment Rehabilitation  ONSET DATE: 03/27/2022  SUBJECTIVE:                                                                                                                                                                                           SUBJECTIVE STATEMENT: Patient reports she underwent a right mastectomy and sentinel node biopsy (2 negative nodes) on 03/27/2022. She  underwent neoadjuvant chemotherapy prior to surgery and will undergo radiation. She is currently doing Herceptin and Kadcyla due to her tumor having a poor neoadjuvant response. She will begin radiation 05/06/2022. She had an infection at her surgery site so she began antibiotics 10 days ago (04/13/2022). Drain was removed 04/19/2022.  PERTINENT HISTORY:  Patient was diagnosed on 11/19/2021 with right 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 It measures 2.6 cm and is located in the upper inner quadrant. It is ER/PR negative and HER2 positive with a Ki67 of 98%. She had a left hip fracture in 05/2020 which required surgery and had a right foot bunion surgery in 08/2021.  PATIENT GOALS:  Reassess how my recovery is going related to arm function, pain, and swelling.  PAIN:  Are you having pain? Yes: NPRS scale: 8/10 Pain location: right chest and axillary region Pain description: burns Aggravating factors: anything touching it Relieving factors: keeping a pad against my skin  PRECAUTIONS: Recent Surgery, right UE Lymphedema risk   ACTIVITY LEVEL / LEISURE: She is not exercising   OBJECTIVE:   PATIENT SURVEYS:  QUICK DASH:  Quick Dash - 04/22/22 0001     Open a tight or new jar Severe difficulty    Do heavy household chores (wash walls, wash floors) Moderate difficulty    Carry a shopping  bag or briefcase Mild difficulty    Wash your back No difficulty    Use a knife to cut food No difficulty    Recreational activities in which you take some force or impact through your arm, shoulder, or hand (golf, hammering, tennis) No difficulty    During the past week, to what extent has your arm, shoulder or hand problem interfered with your normal social activities with family, friends, neighbors, or groups? Quite a bit    During the past week, to what extent has your arm, shoulder or hand problem limited your work or other regular daily activities Quite a bit    Arm, shoulder, or hand pain. Moderate    Tingling (pins and needles) in your arm, shoulder, or hand None    Difficulty Sleeping No difficulty    DASH Score 31.82 %              OBSERVATIONS:  Tightness in right pectoralis muscle at insertion site. Mild edema present just superior to her incision site. Pt is in the pink floral corset and it is too loose on her, not providing compression. Sent in-basket message to surgeon to ask if she can get a compression bra instead. Incision appears to be healing well and there is mild redness present on her lateral trunk from her previous infection. Tender to palpation lateral trunk at site of infection.  POSTURE:  Forward head and rounded shoulders posture  LYMPHEDEMA ASSESSMENT:   UPPER EXTREMITY AROM/PROM:   A/PROM RIGHT   eval   RIGHT 04/22/2022  Shoulder extension 67 51  Shoulder flexion 155 143  Shoulder abduction 156 144  Shoulder internal rotation 77 71  Shoulder external rotation 88 86                          (Blank rows = not tested)   A/PROM LEFT   eval  Shoulder extension 62  Shoulder flexion 149  Shoulder abduction 150  Shoulder internal rotation 80  Shoulder external rotation 73                          (  Blank rows = not tested)     CERVICAL AROM: All within normal limits:      Percent limited  Flexion WNL  Extension WNL  Right lateral flexion WNL   Left lateral flexion 25% limited  Right rotation WNL  Left rotation 25% limited        UPPER EXTREMITY STRENGTH: WFL     LYMPHEDEMA ASSESSMENTS:    LANDMARK RIGHT   eval RIGHT 04/22/2022  10 cm proximal to olecranon process 23.1 22.6  Olecranon process 21.9 21.4  10 cm proximal to ulnar styloid process 19.5 18.7  Just proximal to ulnar styloid process 13.9 13.5  Across hand at thumb web space 16.2 16.7  At base of 2nd digit 5.8 5.6  (Blank rows = not tested)   LANDMARK LEFT   eval LEFT 04/22/2022  10 cm proximal to olecranon process 23.2 23  Olecranon process 21.8 21.6  10 cm proximal to ulnar styloid process 17.7 17.4  Just proximal to ulnar styloid process 13.1 12.9  Across hand at thumb web space 15 15.3  At base of 2nd digit 5.2 5.1  (Blank rows = not tested)       Surgery type/Date: 03/27/2022 right mastectomy and sentinel node biopsy Number of lymph nodes removed: 2 Current/past treatment (chemo, radiation, hormone therapy): Neoadjuvant chemotherapy Other symptoms:  Heaviness/tightness No Pain Yes Pitting edema No Infections Yes Decreased scar mobility Yes Stemmer sign No   PATIENT EDUCATION:  Education details: HEP Person educated: Patient and Child(ren) Education method: Explanation, Demonstration, and Verbal cues Education comprehension: verbalized understanding and returned demonstration   HOME EXERCISE PROGRAM:  Reviewed previously given post op HEP.   ASSESSMENT:  CLINICAL IMPRESSION: Patient is healing well s/p right mastectomy and sentinel node biopsy on 03/27/2022. She has nearly regained full shoulder ROM (lacking 12 degrees of flexion and abduction), shows no signs of lymphedema, and her incision is healing well. She had an infection 10 days ago and completed antibiotics today but continues to have some residual redness present on her right lateral trunk. She would benefit from doing PT to regain full shoulder ROM but declined as she  lives an hour away. She will likely regain full function and ROM by doing her HEP.  Pt will benefit from skilled therapeutic intervention to improve on the following deficits: Decreased knowledge of precautions, impaired UE functional use, pain, decreased ROM, postural dysfunction.   PT treatment/interventions: ADL/Self care home management, Therapeutic exercises, Therapeutic activity, Patient/Family education, Self Care, and Re-evaluation     GOALS: Goals reviewed with patient? Yes  LONG TERM GOALS:  (STG=LTG)  GOALS Name Target Date  Goal status  1 Pt will demonstrate she has regained full shoulder ROM and function post operatively compared to baselines.  Baseline: 01/30/2022 Partially met     PLAN: PT FREQUENCY/DURATION: N/A  PLAN FOR NEXT SESSION: D/C - will return to [facility] screens   [facility]  [address]  [place] [address]  [phone]  After Breast Cancer Class It is recommended you attend the ABC class to be educated on lymphedema risk reduction. This class is free of charge and lasts for 1 hour. It is a 1-time class. You will need to download the Webex app either on your phone or computer. We will send you a link the night before or the morning of the class. You should be able to click on that link to join the class. This is not a confidential class. You don't  have to turn your camera on, but other participants may be able to see your email address. SINCE YOU DON'T WANT TO DO THE CLASS ON THE COMPUTER, I PRINTED THE INFO FOR YOU.  Scar massage You can begin gentle scar massage to you incision sites. Gently place one hand on the incision and move the skin (without sliding on the skin) in various directions. Do this for a few minutes and then you can gently massage either coconut oil or vitamin E cream into the scars.  Compression garment You should continue wearing your compression bra until you feel like you no longer have  swelling.  Home exercise Program Continue doing the exercises you were given until you feel like you can do them without feeling any tightness at the end.   Walking Program Studies show that 30 minutes of walking per day (fast enough to elevate your heart rate) can significantly reduce the risk of a cancer recurrence. If you can't walk due to other medical reasons, we encourage you to find another activity you could do (like a stationary bike or water exercise).  Posture After breast cancer surgery, people frequently sit with rounded shoulders posture because it puts their incisions on slack and feels better. If you sit like this and scar tissue forms in that position, you can become very tight and have pain sitting or standing with good posture. Try to be aware of your posture and sit and stand up tall to heal properly.  Follow up PT: It is recommended you return every 3 months for the first 3 years following surgery to be assessed on the SOZO machine for an L-Dex score. This helps prevent clinically significant lymphedema in 95% of patients. These follow up screens are 10 minute appointments that you are not billed for.  Annia Friendly, [place] 04/22/22 2:01 PM  PHYSICAL THERAPY DISCHARGE SUMMARY  Visits from Start of Care: 2  Current functional level related to goals / functional outcomes: Pt nearly met goals but is lacking 12 degrees of flexion and abduction. She will work on her HEP to regain full ROM. See above for other objective findings.   Remaining deficits: ROM deficits   Education / Equipment: HEP and lymphedema risk reduction education   Patient agrees to discharge. Patient goals were partially met. Patient is being discharged due to being pleased with the current functional level.  Annia Friendly, [place] 04/22/22 2:02 PM

## 2022-04-22 NOTE — Patient Instructions (Addendum)
[facility]  [address]  [place] [address]  [phone]  After Breast Cancer Class It is recommended you attend the ABC class to be educated on lymphedema risk reduction. This class is free of charge and lasts for 1 hour. It is a 1-time class. You will need to download the Webex app either on your phone or computer. We will send you a link the night before or the morning of the class. You should be able to click on that link to join the class. This is not a confidential class. You don't have to turn your camera on, but other participants may be able to see your email address.  Scar massage You can begin gentle scar massage to you incision sites. Gently place one hand on the incision and move the skin (without sliding on the skin) in various directions. Do this for a few minutes and then you can gently massage either coconut oil or vitamin E cream into the scars.  Compression garment You should continue wearing your compression bra until you feel like you no longer have swelling.  Home exercise Program Continue doing the exercises you were given until you feel like you can do them without feeling any tightness at the end.   Walking Program Studies show that 30 minutes of walking per day (fast enough to elevate your heart rate) can significantly reduce the risk of a cancer recurrence. If you can't walk due to other medical reasons, we encourage you to find another activity you could do (like a stationary bike or water exercise).  Posture After breast cancer surgery, people frequently sit with rounded shoulders posture because it puts their incisions on slack and feels better. If you sit like this and scar tissue forms in that position, you can become very tight and have pain sitting or standing with good posture. Try to be aware of your posture and sit and stand up tall to heal properly.  Follow up PT: It is recommended you return every 3 months for the  first 3 years following surgery to be assessed on the SOZO machine for an L-Dex score. This helps prevent clinically significant lymphedema in 95% of patients. These follow up screens are 10 minute appointments that you are not billed for. You are scheduled for December 4th 2023 at 12:00. Just come whenever you finish radiation that day.

## 2022-04-23 ENCOUNTER — Telehealth: Payer: Self-pay | Admitting: Physical Therapy

## 2022-04-23 ENCOUNTER — Other Ambulatory Visit: Payer: Self-pay

## 2022-04-23 ENCOUNTER — Ambulatory Visit: Payer: Medicare Other

## 2022-04-23 DIAGNOSIS — C50311 Malignant neoplasm of lower-inner quadrant of right female breast: Secondary | ICD-10-CM | POA: Diagnosis not present

## 2022-04-23 DIAGNOSIS — Z171 Estrogen receptor negative status [ER-]: Secondary | ICD-10-CM | POA: Diagnosis not present

## 2022-04-23 DIAGNOSIS — Z510 Encounter for antineoplastic radiation therapy: Secondary | ICD-10-CM | POA: Diagnosis not present

## 2022-04-23 NOTE — Telephone Encounter (Signed)
Ms. Holik phoned the clinic and requested a call back as she was concerned about her incision. I called her back and she stated that when she woke up this morning, there was dark brown discharge that had leaked from her incision. She denied fever, redness, pain, increased edema, or erythema on her chest. She said her incision looked fine. She did not sleep in the compression foam given yesterday but wore it for a short while after she left the PT clinic. She said she was not concerned but wanted to let someone know. I encouraged her to contact her surgeon but told her it sounded like it was ok. She said she would call Dr. Brantley Stage. Annia Friendly, [place] 04/23/22 9:58 AM'

## 2022-04-25 ENCOUNTER — Ambulatory Visit (HOSPITAL_COMMUNITY)
Admission: RE | Admit: 2022-04-25 | Discharge: 2022-04-25 | Disposition: A | Discharge status: Home / Self Care | Payer: Medicare Other | Source: Ambulatory Visit | Attending: Hematology and Oncology | Admitting: Hematology and Oncology

## 2022-04-25 DIAGNOSIS — Z5181 Encounter for therapeutic drug level monitoring: Secondary | ICD-10-CM | POA: Diagnosis not present

## 2022-04-25 DIAGNOSIS — Z0189 Encounter for other specified special examinations: Secondary | ICD-10-CM | POA: Diagnosis not present

## 2022-04-25 DIAGNOSIS — I080 Rheumatic disorders of both mitral and aortic valves: Secondary | ICD-10-CM | POA: Insufficient documentation

## 2022-04-25 DIAGNOSIS — Z79899 Other long term (current) drug therapy: Secondary | ICD-10-CM | POA: Insufficient documentation

## 2022-04-25 LAB — ECHOCARDIOGRAM COMPLETE
Area-P 1/2: 2.83 cm2
Calc EF: 53.8 %
P 1/2 time: 577 msec
S' Lateral: 3.7 cm
Single Plane A2C EF: 50.5 %
Single Plane A4C EF: 54.6 %

## 2022-04-25 NOTE — Progress Notes (Signed)
Echocardiogram 2D Echocardiogram has been performed.  Sydney Key 04/25/2022, 2:06 PM

## 2022-04-25 NOTE — Progress Notes (Signed)
Patient Care Team: Raelene Bott, MD as PCP - General (Internal Medicine) Mauro Kaufmann, RN as Oncology Nurse Navigator Rockwell Germany, RN as Oncology Nurse Navigator Erroll Luna, MD as Consulting Physician (General Surgery) Nicholas Lose, MD as Consulting Physician (Hematology and Oncology) Eppie Gibson, MD as Attending Physician (Radiation Oncology)  DIAGNOSIS:  Encounter Diagnosis  Name Primary?   Malignant neoplasm of lower-inner quadrant of right breast of female, estrogen receptor negative ([place])     SUMMARY OF ONCOLOGIC HISTORY: Oncology History  Malignant neoplasm of lower-inner quadrant of right breast of female, estrogen receptor negative ([place])  11/28/2021 Initial Diagnosis   Palpable right breast lump 2.6 cm with calcifications at 3 o'clock position, additional 1.3 cm lesion is an intramammary lymph node biopsy: Benign, axilla negative, biopsy of the breast lump: Grade 3 IDC ER 0%, PR 0%, Ki-67 98%, HER2 3+ positive   12/05/2021 Cancer Staging   Staging form: Breast, AJCC 8th Edition - Clinical: Stage IIA (cT2, cN0, cM0, G3, ER-, PR-, HER2+) - Signed by Nicholas Lose, MD on 12/05/2021 Stage prefix: Initial diagnosis Histologic grading system: 3 grade system   12/26/2021 - 03/12/2022 Chemotherapy   Patient is on Treatment Plan : BREAST Paclitaxel + Trastuzumab q7d / Trastuzumab q21d      Genetic Testing   [facility] CustomNext Panel was Negative. Report date is 12/18/2021.  The CustomNext-Cancer+RNAinsight panel offered by [facility] includes sequencing and rearrangement analysis for the following 47 genes:  APC, ATM, AXIN2, BARD1, BMPR1A, BRCA1, BRCA2, BRIP1, CDH1, CDK4, CDKN2A, CHEK2, CTNNA1, DICER1, EPCAM, GREM1, HOXB13, KIT, MEN1, MLH1, MSH2, MSH3, MSH6, MUTYH, NBN, NF1, NTHL1, PALB2, PDGFRA, PMS2, POLD1, POLE, PTEN, RAD50, RAD51C, RAD51D, SDHA, SDHB, SDHC, SDHD, SMAD4, SMARCA4, STK11, TP53, TSC1, TSC2, and VHL.  RNA data is routinely analyzed for use in variant  interpretation for all genes.   03/27/2022 Surgery   Right breast mastectomy: Invasive ductal carcinoma Nottingham grade 3 with no morphologic evidence of neoadjuvant chemotherapy effect negative for lymphovascular and perineural invasion the size noted was 4.2 x 3.7 x 3.5 cm margins were negative and 2 lymph nodes biopsied were negative. Cancer staging: ypT2 pN0 pM0   04/02/2022 - 04/02/2022 Chemotherapy   Patient is on Treatment Plan : BREAST Trastuzumab IV (8/6) or SQ (600) D1 q21d     04/08/2022 -  Chemotherapy   Patient is on Treatment Plan : BREAST ADO-Trastuzumab Emtansine (Kadcyla) q21d       CHIEF COMPLIANT:  Follow-up Kadcyla cycle 2  INTERVAL HISTORY: Sydney Key is a  88 y.o. female is here because of recent diagnosis of right breast cancer. She presents to the clinic today for a follow-up. She reports treatment went well. She does have some constipation. Denies fatigue and nausea. Taste is still good. She still having a little bit of swelling.   ALLERGIES:  is allergic to paclitaxel.  MEDICATIONS:  Current Outpatient Medications  Medication Sig Dispense Refill   acetaminophen (TYLENOL) 500 MG tablet Take 500 mg by mouth at bedtime.     acetaminophen (TYLENOL) 500 MG tablet Take 2 tablets (1,000 mg total) by mouth every 6 (six) hours as needed. 100 tablet 2   Ascorbic Acid (VITAMIN C WITH ROSE HIPS) 500 MG tablet Take 500 mg by mouth daily.     aspirin EC 81 MG tablet Take 81 mg by mouth every evening.     brimonidine (ALPHAGAN) 0.15 % ophthalmic solution Place 1 drop into the left eye 2 (two) times daily.  cholecalciferol (VITAMIN D) 1000 UNITS tablet Take 1,000 Units by mouth daily.     cyanocobalamin (,VITAMIN B-12,) 1000 MCG/ML injection Inject 1,000 mcg into the muscle every 30 (thirty) days.     escitalopram (LEXAPRO) 10 MG tablet Take 5 mg by mouth at bedtime.     gabapentin (NEURONTIN) 300 MG capsule Take 300 mg by mouth at bedtime.     loperamide (IMODIUM) 2 MG  capsule Take 1 capsule (2 mg total) by mouth as needed for diarrhea or loose stools. 30 capsule 0   Multiple Vitamin (MULTIVITAMIN WITH MINERALS) TABS tablet Take 1 tablet by mouth daily.     No current facility-administered medications for this visit.    PHYSICAL EXAMINATION: ECOG PERFORMANCE STATUS: 1 - Symptomatic but completely ambulatory  Vitals:   04/29/22 1046  BP: 122/67  Pulse: 74  Resp: 18  Temp: (!) 97.3 F (36.3 C)  SpO2: 99%   Filed Weights   04/29/22 1046  Weight: 136 lb 4.8 oz (61.8 kg)      LABORATORY DATA:  I have reviewed the data as listed    Latest Ref Rng & Units 04/08/2022    9:40 AM 03/12/2022   10:38 AM 03/05/2022   12:18 PM  CMP  Glucose 70 - 99 mg/dL 107  95  99   BUN 8 - 23 mg/dL 21  19  19    Creatinine 0.44 - 1.00 mg/dL 0.63  0.71  0.56   Sodium 135 - 145 mmol/L 140  137  138   Potassium 3.5 - 5.1 mmol/L 4.3  4.4  3.9   Chloride 98 - 111 mmol/L 107  104  105   CO2 22 - 32 mmol/L 28  29  29    Calcium 8.9 - 10.3 mg/dL 9.1  9.5  9.6   Total Protein 6.5 - 8.1 g/dL 6.1  6.4  6.5   Total Bilirubin 0.3 - 1.2 mg/dL 0.7  0.8  0.9   Alkaline Phos 38 - 126 U/L 65  63  63   AST 15 - 41 U/L 19  17  18    ALT 0 - 44 U/L 14  16  17      Lab Results  Component Value Date   WBC 4.3 04/29/2022   HGB 10.8 (L) 04/29/2022   HCT 32.7 (L) 04/29/2022   MCV 94.5 04/29/2022   PLT 262 04/29/2022   NEUTROABS 2.2 04/29/2022    ASSESSMENT & PLAN:  Malignant neoplasm of lower-inner quadrant of right breast of female, estrogen receptor negative ([place]) 11/28/2021:Palpable right breast lump 2.6 cm with calcifications at 3 o'clock position, additional 1.3 cm lesion is an intramammary lymph node biopsy: Benign, axilla negative, biopsy of the breast lump: Grade 3 IDC ER 0%, PR 0%, Ki-67 98%, HER2 3+ positive   Treatment plan: 1.  Neoadjuvant chemotherapy with Taxol Herceptin weekly x12 followed by Kadcyla maintenance 2. Rt Mastectomy with sentinel lymph node biopsy:  03/27/2022: 4.2 cm grade 3 IDC with negative margins 0/2 lymph nodes negative, ER 0%, PR 0%, Ki-67 80%, HER2 3+ positive 3.  +/- Adjuvant radiation ----------------------------------------------------------------------------------------------------------------- Current treatment: Kadcyla cycle 2 Kadcyla toxicities: 1.  Constipation: I encouraged her to take a stool softener regularly and Dulcolax as needed. 2. slight hair thinning   Radiation will start 05/07/2022 Return to clinic every 3 weeks for Kadcyla        No orders of the defined types were placed in this encounter.  The patient has a good understanding of the overall  plan. she agrees with it. she will call with any problems that may develop before the next visit here. Total time spent: 30 mins including face to face time and time spent for planning, charting and co-ordination of care   Harriette Ohara, MD 04/29/22    I Gardiner Coins am scribing for Dr. Lindi Adie  I have reviewed the above documentation for accuracy and completeness, and I agree with the above.

## 2022-04-29 ENCOUNTER — Inpatient Hospital Stay: Payer: Medicare Other

## 2022-04-29 ENCOUNTER — Encounter: Payer: Self-pay | Admitting: *Deleted

## 2022-04-29 ENCOUNTER — Inpatient Hospital Stay (HOSPITAL_BASED_OUTPATIENT_CLINIC_OR_DEPARTMENT_OTHER): Payer: Medicare Other | Admitting: Hematology and Oncology

## 2022-04-29 ENCOUNTER — Other Ambulatory Visit: Payer: Self-pay

## 2022-04-29 VITALS — BP 136/69 | HR 68 | Resp 18

## 2022-04-29 DIAGNOSIS — Z79899 Other long term (current) drug therapy: Secondary | ICD-10-CM | POA: Diagnosis not present

## 2022-04-29 DIAGNOSIS — Z171 Estrogen receptor negative status [ER-]: Secondary | ICD-10-CM | POA: Diagnosis not present

## 2022-04-29 DIAGNOSIS — C50311 Malignant neoplasm of lower-inner quadrant of right female breast: Secondary | ICD-10-CM

## 2022-04-29 DIAGNOSIS — Z5111 Encounter for antineoplastic chemotherapy: Secondary | ICD-10-CM | POA: Diagnosis not present

## 2022-04-29 DIAGNOSIS — Z5112 Encounter for antineoplastic immunotherapy: Secondary | ICD-10-CM | POA: Diagnosis not present

## 2022-04-29 LAB — CBC WITH DIFFERENTIAL (CANCER CENTER ONLY)
Abs Immature Granulocytes: 0.01 10*3/uL (ref 0.00–0.07)
Basophils Absolute: 0 10*3/uL (ref 0.0–0.1)
Basophils Relative: 1 %
Eosinophils Absolute: 0.1 10*3/uL (ref 0.0–0.5)
Eosinophils Relative: 2 %
HCT: 32.7 % — ABNORMAL LOW (ref 36.0–46.0)
Hemoglobin: 10.8 g/dL — ABNORMAL LOW (ref 12.0–15.0)
Immature Granulocytes: 0 %
Lymphocytes Relative: 37 %
Lymphs Abs: 1.6 10*3/uL (ref 0.7–4.0)
MCH: 31.2 pg (ref 26.0–34.0)
MCHC: 33 g/dL (ref 30.0–36.0)
MCV: 94.5 fL (ref 80.0–100.0)
Monocytes Absolute: 0.4 10*3/uL (ref 0.1–1.0)
Monocytes Relative: 10 %
Neutro Abs: 2.2 10*3/uL (ref 1.7–7.7)
Neutrophils Relative %: 50 %
Platelet Count: 262 10*3/uL (ref 150–400)
RBC: 3.46 MIL/uL — ABNORMAL LOW (ref 3.87–5.11)
RDW: 12.7 % (ref 11.5–15.5)
WBC Count: 4.3 10*3/uL (ref 4.0–10.5)
nRBC: 0 % (ref 0.0–0.2)

## 2022-04-29 LAB — CMP (CANCER CENTER ONLY)
ALT: 16 U/L (ref 0–44)
AST: 25 U/L (ref 15–41)
Albumin: 3.8 g/dL (ref 3.5–5.0)
Alkaline Phosphatase: 67 U/L (ref 38–126)
Anion gap: 6 (ref 5–15)
BUN: 16 mg/dL (ref 8–23)
CO2: 28 mmol/L (ref 22–32)
Calcium: 9.5 mg/dL (ref 8.9–10.3)
Chloride: 105 mmol/L (ref 98–111)
Creatinine: 0.7 mg/dL (ref 0.44–1.00)
GFR, Estimated: >60 mL/min (ref >60)
Glucose, Bld: 98 mg/dL (ref 70–99)
Potassium: 4 mmol/L (ref 3.5–5.1)
Sodium: 139 mmol/L (ref 135–145)
Total Bilirubin: 0.8 mg/dL (ref 0.3–1.2)
Total Protein: 6.7 g/dL (ref 6.5–8.1)

## 2022-04-29 MED ORDER — ACETAMINOPHEN 325 MG PO TABS
650.0000 mg | ORAL_TABLET | Freq: Once | ORAL | Status: AC
  Administered 2022-04-29: 650 mg via ORAL
  Filled 2022-04-29: qty 2

## 2022-04-29 MED ORDER — DIPHENHYDRAMINE HCL 25 MG PO CAPS
25.0000 mg | ORAL_CAPSULE | Freq: Once | ORAL | Status: AC
  Administered 2022-04-29: 25 mg via ORAL
  Filled 2022-04-29: qty 1

## 2022-04-29 MED ORDER — SODIUM CHLORIDE 0.9 % IV SOLN: Freq: Once | INTRAVENOUS | Status: AC

## 2022-04-29 MED ORDER — SODIUM CHLORIDE 0.9 % IV SOLN
3.6000 mg/kg | Freq: Once | INTRAVENOUS | Status: AC
  Administered 2022-04-29: 220 mg via INTRAVENOUS
  Filled 2022-04-29: qty 8

## 2022-04-29 MED ORDER — PROCHLORPERAZINE MALEATE 10 MG PO TABS
10.0000 mg | ORAL_TABLET | Freq: Once | ORAL | Status: AC
  Administered 2022-04-29: 10 mg via ORAL
  Filled 2022-04-29: qty 1

## 2022-04-29 MED ORDER — HEPARIN SOD (PORK) LOCK FLUSH 100 UNIT/ML IV SOLN
500.0000 [IU] | Freq: Once | INTRAVENOUS | Status: AC | PRN
  Administered 2022-04-29: 500 [IU]

## 2022-04-29 MED ORDER — SODIUM CHLORIDE 0.9% FLUSH
10.0000 mL | INTRAVENOUS | Status: DC | PRN
  Administered 2022-04-29: 10 mL

## 2022-04-29 NOTE — Patient Instructions (Signed)
[facility] MEDICAL [facility]COLOG[facility]  Discharge Instructions: Thank you for choosing [facility] to provide your oncology and hematology care.   If you have a lab appointment with the Cancer Center, please go directly to the Cancer Center and check in at the registration area.   Wear comfortable clothing and clothing appropriate for easy access to any Portacath or PICC line.   We strive to give you quality time with your provider. You may need to reschedule your appointment if you arrive late (15 or more minutes).  Arriving late affects you and other patients whose appointments are after yours.  Also, if you miss three or more appointments without notifying the office, you may be dismissed from the clinic at the provider's discretion.      For prescription refill requests, have your pharmacy contact our office and allow 72 hours for refills to be completed.    Today you received the following chemotherapy and/or immunotherapy agents kadcyla      To help prevent nausea and vomiting after your treatment, we encourage you to take your nausea medication as directed.  BELOW ARE SYMPTOMS THAT SHOULD BE REPORTED IMMEDIATELY: *FEVER GREATER THAN 100.4 F (38 C) OR HIGHER *CHILLS OR SWEATING *NAUSEA AND VOMITING THAT IS NOT CONTROLLED WITH YOUR NAUSEA MEDICATION *UNUSUAL SHORTNESS OF BREATH *UNUSUAL BRUISING OR BLEEDING *URINARY PROBLEMS (pain or burning when urinating, or frequent urination) *BOWEL PROBLEMS (unusual diarrhea, constipation, pain near the anus) TENDERNESS IN MOUTH AND THROAT WITH OR WITHOUT PRESENCE OF ULCERS (sore throat, sores in mouth, or a toothache) UNUSUAL RASH, SWELLING OR PAIN  UNUSUAL VAGINAL DISCHARGE OR ITCHING   Items with * indicate a potential emergency and should be followed up as soon as possible or go to the Emergency Department if any problems should occur.  Please show the CHEMOTHERAPY ALERT CARD or IMMUNOTHERAPY ALERT CARD at check-in to the  Emergency Department and triage nurse.  Should you have questions after your visit or need to cancel or reschedule your appointment, please contact [facility]  Dept: [phone]  and follow the prompts.  Office hours are 8:00 a.m. to 4:30 p.m. Monday - Friday. Please note that voicemails left after 4:00 p.m. may not be returned until the following business day.  We are closed weekends and major holidays. You have access to a nurse at all times for urgent questions. Please call the main number to the clinic Dept: [phone] and follow the prompts.   For any non-urgent questions, you may also contact your provider using MyChart. We now offer e-Visits for anyone 18 and older to request care online for non-urgent symptoms. For details visit [email or web address].   Also download the MyChart app! Go to the app store, search "MyChart", open the app, select [facility], and log in with your MyChart username and password.  Masks are optional in the cancer centers. If you would like for your care team to wear a mask while they are taking care of you, please let them know. You may have one support person who is at least 86 years old accompany you for your appointments. 

## 2022-04-29 NOTE — Assessment & Plan Note (Addendum)
11/28/2021:Palpable right breast lump 2.6 cm with calcifications at 3 o'clock position, additional 1.3 cm lesion is an intramammary lymph node biopsy: Benign, axilla negative, biopsy of the breast lump: Grade 3 IDC ER 0%, PR 0%, Ki-67 98%, HER2 3+ positive  Treatment plan: 1.Neoadjuvant chemotherapy with Taxol Herceptin weekly x12 followed by Kadcyla maintenance 2.Rt Mastectomy with sentinel lymph node biopsy: 03/27/2022: 4.2 cm grade 3 IDC with negative margins 0/2 lymph nodes negative, ER 0%, PR 0%, Ki-67 80%, HER2 3+ positive 3.+/- Adjuvant radiation ----------------------------------------------------------------------------------------------------------------- Current treatment: Kadcyla cycle 2 Kadcyla toxicities: 1.  Constipation: I encouraged Sydney Key to take a stool softener regularly and Dulcolax as needed. 2. slight hair thinning  Radiation will start 05/07/2022 Return to clinic every 3 weeks for [facility]

## 2022-04-29 NOTE — Progress Notes (Signed)
Patient declined to stay for 30 minute observation. VSS and ambulatory at discharge. Patient educated to call with any concerns.

## 2022-05-01 ENCOUNTER — Ambulatory Visit: Payer: Medicare Other | Admitting: Radiation Oncology

## 2022-05-02 ENCOUNTER — Ambulatory Visit: Payer: Medicare Other

## 2022-05-03 ENCOUNTER — Ambulatory Visit: Payer: Medicare Other

## 2022-05-06 ENCOUNTER — Other Ambulatory Visit: Payer: Self-pay

## 2022-05-06 ENCOUNTER — Ambulatory Visit
Admission: RE | Admit: 2022-05-06 | Discharge: 2022-05-06 | Disposition: A | Discharge status: Home / Self Care | Payer: Medicare Other | Source: Ambulatory Visit | Attending: Radiation Oncology | Admitting: Radiation Oncology

## 2022-05-06 DIAGNOSIS — C50311 Malignant neoplasm of lower-inner quadrant of right female breast: Secondary | ICD-10-CM | POA: Diagnosis not present

## 2022-05-06 DIAGNOSIS — Z171 Estrogen receptor negative status [ER-]: Secondary | ICD-10-CM | POA: Diagnosis not present

## 2022-05-06 DIAGNOSIS — Z510 Encounter for antineoplastic radiation therapy: Secondary | ICD-10-CM | POA: Diagnosis not present

## 2022-05-06 LAB — RAD ONC ARIA SESSION SUMMARY
Course Elapsed Days: 0
Plan Fractions Treated to Date: 1
Plan Prescribed Dose Per Fraction: 2 Gy
Plan Total Fractions Prescribed: 13
Plan Total Prescribed Dose: 26 Gy
Reference Point Dosage Given to Date: 2 Gy
Reference Point Session Dosage Given: 2 Gy
Session Number: 1

## 2022-05-06 MED ORDER — ALRA NON-METALLIC DEODORANT (RAD-ONC)
1.0000 | Freq: Once | TOPICAL | Status: AC
  Administered 2022-05-06: 1 via TOPICAL

## 2022-05-06 MED ORDER — RADIAPLEXRX EX GEL: Freq: Two times a day (BID) | CUTANEOUS | Status: DC

## 2022-05-06 NOTE — Progress Notes (Signed)
Pt here for patient teaching.    Pt given Radiation and You booklet, skin care instructions, Alra deodorant, and Radiaplex gel.    Reviewed areas of pertinence such as fatigue, hair loss in treatment field, skin changes, breast tenderness, and breast swelling .   Pt able to give teach back of to pat skin and use unscented/gentle soap,apply Radiaplex bid, avoid applying anything to skin within 4 hours of treatment, avoid wearing an under wire bra, and to use an electric razor if they must shave.   Pt verbalizes understanding of information given and will contact nursing with any questions or concerns.    [email or web address]

## 2022-05-07 ENCOUNTER — Ambulatory Visit: Admission: RE | Admit: 2022-05-07 | Payer: Medicare Other | Source: Ambulatory Visit

## 2022-05-08 ENCOUNTER — Ambulatory Visit
Admission: RE | Admit: 2022-05-08 | Discharge: 2022-05-08 | Disposition: A | Discharge status: Home / Self Care | Payer: Medicare Other | Source: Ambulatory Visit | Attending: Radiation Oncology | Admitting: Radiation Oncology

## 2022-05-08 ENCOUNTER — Other Ambulatory Visit: Payer: Self-pay

## 2022-05-08 DIAGNOSIS — C50311 Malignant neoplasm of lower-inner quadrant of right female breast: Secondary | ICD-10-CM | POA: Insufficient documentation

## 2022-05-08 DIAGNOSIS — Z510 Encounter for antineoplastic radiation therapy: Secondary | ICD-10-CM | POA: Diagnosis not present

## 2022-05-08 DIAGNOSIS — E538 Deficiency of other specified B group vitamins: Secondary | ICD-10-CM | POA: Diagnosis not present

## 2022-05-08 DIAGNOSIS — Z171 Estrogen receptor negative status [ER-]: Secondary | ICD-10-CM | POA: Insufficient documentation

## 2022-05-08 DIAGNOSIS — Z23 Encounter for immunization: Secondary | ICD-10-CM | POA: Diagnosis not present

## 2022-05-08 LAB — RAD ONC ARIA SESSION SUMMARY
Course Elapsed Days: 2
Plan Fractions Treated to Date: 1
Plan Prescribed Dose Per Fraction: 2 Gy
Plan Total Fractions Prescribed: 12
Plan Total Prescribed Dose: 24 Gy
Reference Point Dosage Given to Date: 4 Gy
Reference Point Session Dosage Given: 2 Gy
Session Number: 2

## 2022-05-09 ENCOUNTER — Ambulatory Visit
Admission: RE | Admit: 2022-05-09 | Discharge: 2022-05-09 | Disposition: A | Discharge status: Home / Self Care | Payer: Medicare Other | Source: Ambulatory Visit | Attending: Radiation Oncology | Admitting: Radiation Oncology

## 2022-05-09 ENCOUNTER — Other Ambulatory Visit: Payer: Self-pay

## 2022-05-09 DIAGNOSIS — C50311 Malignant neoplasm of lower-inner quadrant of right female breast: Secondary | ICD-10-CM | POA: Diagnosis not present

## 2022-05-09 DIAGNOSIS — Z510 Encounter for antineoplastic radiation therapy: Secondary | ICD-10-CM | POA: Diagnosis not present

## 2022-05-09 DIAGNOSIS — Z171 Estrogen receptor negative status [ER-]: Secondary | ICD-10-CM | POA: Diagnosis not present

## 2022-05-09 LAB — RAD ONC ARIA SESSION SUMMARY
Course Elapsed Days: 3
Plan Fractions Treated to Date: 2
Plan Prescribed Dose Per Fraction: 2 Gy
Plan Total Fractions Prescribed: 13
Plan Total Prescribed Dose: 26 Gy
Reference Point Dosage Given to Date: 6 Gy
Reference Point Session Dosage Given: 2 Gy
Session Number: 3

## 2022-05-10 ENCOUNTER — Other Ambulatory Visit: Payer: Self-pay

## 2022-05-10 ENCOUNTER — Ambulatory Visit
Admission: RE | Admit: 2022-05-10 | Discharge: 2022-05-10 | Disposition: A | Discharge status: Home / Self Care | Payer: Medicare Other | Source: Ambulatory Visit | Attending: Radiation Oncology | Admitting: Radiation Oncology

## 2022-05-10 DIAGNOSIS — Z171 Estrogen receptor negative status [ER-]: Secondary | ICD-10-CM | POA: Diagnosis not present

## 2022-05-10 DIAGNOSIS — Z510 Encounter for antineoplastic radiation therapy: Secondary | ICD-10-CM | POA: Diagnosis not present

## 2022-05-10 DIAGNOSIS — C50311 Malignant neoplasm of lower-inner quadrant of right female breast: Secondary | ICD-10-CM | POA: Diagnosis not present

## 2022-05-10 LAB — RAD ONC ARIA SESSION SUMMARY
Course Elapsed Days: 4
Plan Fractions Treated to Date: 2
Plan Prescribed Dose Per Fraction: 2 Gy
Plan Total Fractions Prescribed: 12
Plan Total Prescribed Dose: 24 Gy
Reference Point Dosage Given to Date: 8 Gy
Reference Point Session Dosage Given: 2 Gy
Session Number: 4

## 2022-05-13 ENCOUNTER — Ambulatory Visit: Payer: Medicare Other

## 2022-05-13 ENCOUNTER — Other Ambulatory Visit: Payer: Self-pay

## 2022-05-13 ENCOUNTER — Ambulatory Visit
Admission: RE | Admit: 2022-05-13 | Discharge: 2022-05-13 | Disposition: A | Discharge status: Home / Self Care | Payer: Medicare Other | Source: Ambulatory Visit | Attending: Radiation Oncology | Admitting: Radiation Oncology

## 2022-05-13 DIAGNOSIS — Z171 Estrogen receptor negative status [ER-]: Secondary | ICD-10-CM | POA: Diagnosis not present

## 2022-05-13 DIAGNOSIS — C50311 Malignant neoplasm of lower-inner quadrant of right female breast: Secondary | ICD-10-CM | POA: Diagnosis not present

## 2022-05-13 DIAGNOSIS — Z510 Encounter for antineoplastic radiation therapy: Secondary | ICD-10-CM | POA: Diagnosis not present

## 2022-05-13 LAB — RAD ONC ARIA SESSION SUMMARY
Course Elapsed Days: 7
Plan Fractions Treated to Date: 3
Plan Prescribed Dose Per Fraction: 2 Gy
Plan Total Fractions Prescribed: 13
Plan Total Prescribed Dose: 26 Gy
Reference Point Dosage Given to Date: 10 Gy
Reference Point Session Dosage Given: 2 Gy
Session Number: 5

## 2022-05-14 ENCOUNTER — Ambulatory Visit
Admission: RE | Admit: 2022-05-14 | Discharge: 2022-05-14 | Disposition: A | Discharge status: Home / Self Care | Payer: Medicare Other | Source: Ambulatory Visit | Attending: Radiation Oncology | Admitting: Radiation Oncology

## 2022-05-14 ENCOUNTER — Other Ambulatory Visit: Payer: Self-pay

## 2022-05-14 DIAGNOSIS — C50311 Malignant neoplasm of lower-inner quadrant of right female breast: Secondary | ICD-10-CM | POA: Diagnosis not present

## 2022-05-14 DIAGNOSIS — Z510 Encounter for antineoplastic radiation therapy: Secondary | ICD-10-CM | POA: Diagnosis not present

## 2022-05-14 DIAGNOSIS — Z171 Estrogen receptor negative status [ER-]: Secondary | ICD-10-CM | POA: Diagnosis not present

## 2022-05-14 LAB — RAD ONC ARIA SESSION SUMMARY
Course Elapsed Days: 8
Plan Fractions Treated to Date: 3
Plan Prescribed Dose Per Fraction: 2 Gy
Plan Total Fractions Prescribed: 12
Plan Total Prescribed Dose: 24 Gy
Reference Point Dosage Given to Date: 12 Gy
Reference Point Session Dosage Given: 2 Gy
Session Number: 6

## 2022-05-15 ENCOUNTER — Other Ambulatory Visit: Payer: Self-pay

## 2022-05-15 ENCOUNTER — Ambulatory Visit
Admission: RE | Admit: 2022-05-15 | Discharge: 2022-05-15 | Disposition: A | Discharge status: Home / Self Care | Payer: Medicare Other | Source: Ambulatory Visit | Attending: Radiation Oncology | Admitting: Radiation Oncology

## 2022-05-15 DIAGNOSIS — C50311 Malignant neoplasm of lower-inner quadrant of right female breast: Secondary | ICD-10-CM | POA: Diagnosis not present

## 2022-05-15 DIAGNOSIS — Z510 Encounter for antineoplastic radiation therapy: Secondary | ICD-10-CM | POA: Diagnosis not present

## 2022-05-15 DIAGNOSIS — Z171 Estrogen receptor negative status [ER-]: Secondary | ICD-10-CM | POA: Diagnosis not present

## 2022-05-15 LAB — RAD ONC ARIA SESSION SUMMARY
Course Elapsed Days: 9
Plan Fractions Treated to Date: 4
Plan Prescribed Dose Per Fraction: 2 Gy
Plan Total Fractions Prescribed: 13
Plan Total Prescribed Dose: 26 Gy
Reference Point Dosage Given to Date: 14 Gy
Reference Point Session Dosage Given: 2 Gy
Session Number: 7

## 2022-05-16 ENCOUNTER — Ambulatory Visit
Admission: RE | Admit: 2022-05-16 | Discharge: 2022-05-16 | Disposition: A | Discharge status: Home / Self Care | Payer: Medicare Other | Source: Ambulatory Visit | Attending: Radiation Oncology | Admitting: Radiation Oncology

## 2022-05-16 ENCOUNTER — Other Ambulatory Visit: Payer: Self-pay

## 2022-05-16 DIAGNOSIS — C50311 Malignant neoplasm of lower-inner quadrant of right female breast: Secondary | ICD-10-CM | POA: Diagnosis not present

## 2022-05-16 DIAGNOSIS — Z510 Encounter for antineoplastic radiation therapy: Secondary | ICD-10-CM | POA: Diagnosis not present

## 2022-05-16 DIAGNOSIS — Z171 Estrogen receptor negative status [ER-]: Secondary | ICD-10-CM | POA: Diagnosis not present

## 2022-05-16 LAB — RAD ONC ARIA SESSION SUMMARY
Course Elapsed Days: 10
Plan Fractions Treated to Date: 4
Plan Prescribed Dose Per Fraction: 2 Gy
Plan Total Fractions Prescribed: 12
Plan Total Prescribed Dose: 24 Gy
Reference Point Dosage Given to Date: 16 Gy
Reference Point Session Dosage Given: 2 Gy
Session Number: 8

## 2022-05-17 ENCOUNTER — Telehealth: Payer: Self-pay | Admitting: Hematology and Oncology

## 2022-05-17 ENCOUNTER — Ambulatory Visit
Admission: RE | Admit: 2022-05-17 | Discharge: 2022-05-17 | Disposition: A | Discharge status: Home / Self Care | Payer: Medicare Other | Source: Ambulatory Visit | Attending: Radiation Oncology | Admitting: Radiation Oncology

## 2022-05-17 ENCOUNTER — Other Ambulatory Visit: Payer: Self-pay

## 2022-05-17 DIAGNOSIS — Z171 Estrogen receptor negative status [ER-]: Secondary | ICD-10-CM | POA: Diagnosis not present

## 2022-05-17 DIAGNOSIS — C50311 Malignant neoplasm of lower-inner quadrant of right female breast: Secondary | ICD-10-CM | POA: Diagnosis not present

## 2022-05-17 DIAGNOSIS — Z510 Encounter for antineoplastic radiation therapy: Secondary | ICD-10-CM | POA: Diagnosis not present

## 2022-05-17 LAB — RAD ONC ARIA SESSION SUMMARY
Course Elapsed Days: 11
Plan Fractions Treated to Date: 5
Plan Prescribed Dose Per Fraction: 2 Gy
Plan Total Fractions Prescribed: 13
Plan Total Prescribed Dose: 26 Gy
Reference Point Dosage Given to Date: 18 Gy
Reference Point Session Dosage Given: 2 Gy
Session Number: 9

## 2022-05-17 NOTE — Telephone Encounter (Signed)
Scheduled appointment per 11/10 los. Left voicemail.

## 2022-05-19 ENCOUNTER — Other Ambulatory Visit: Payer: Self-pay

## 2022-05-20 ENCOUNTER — Other Ambulatory Visit: Payer: Self-pay

## 2022-05-20 ENCOUNTER — Encounter: Payer: Self-pay | Admitting: Adult Health

## 2022-05-20 ENCOUNTER — Ambulatory Visit
Admission: RE | Admit: 2022-05-20 | Discharge: 2022-05-20 | Disposition: A | Discharge status: Home / Self Care | Payer: Medicare Other | Source: Ambulatory Visit | Attending: Radiation Oncology | Admitting: Radiation Oncology

## 2022-05-20 ENCOUNTER — Inpatient Hospital Stay: Payer: Medicare Other | Attending: Hematology and Oncology

## 2022-05-20 ENCOUNTER — Inpatient Hospital Stay: Payer: Medicare Other

## 2022-05-20 ENCOUNTER — Inpatient Hospital Stay (HOSPITAL_BASED_OUTPATIENT_CLINIC_OR_DEPARTMENT_OTHER): Payer: Medicare Other | Admitting: Adult Health

## 2022-05-20 VITALS — BP 121/50 | HR 63 | Temp 97.8°F | Resp 18 | Ht 65.5 in | Wt 136.2 lb

## 2022-05-20 DIAGNOSIS — Z510 Encounter for antineoplastic radiation therapy: Secondary | ICD-10-CM | POA: Diagnosis not present

## 2022-05-20 DIAGNOSIS — Z171 Estrogen receptor negative status [ER-]: Secondary | ICD-10-CM | POA: Diagnosis not present

## 2022-05-20 DIAGNOSIS — C50311 Malignant neoplasm of lower-inner quadrant of right female breast: Secondary | ICD-10-CM | POA: Insufficient documentation

## 2022-05-20 DIAGNOSIS — Z79899 Other long term (current) drug therapy: Secondary | ICD-10-CM | POA: Insufficient documentation

## 2022-05-20 DIAGNOSIS — Z5112 Encounter for antineoplastic immunotherapy: Secondary | ICD-10-CM | POA: Diagnosis not present

## 2022-05-20 DIAGNOSIS — E7800 Pure hypercholesterolemia, unspecified: Secondary | ICD-10-CM | POA: Insufficient documentation

## 2022-05-20 DIAGNOSIS — Z5111 Encounter for antineoplastic chemotherapy: Secondary | ICD-10-CM | POA: Diagnosis not present

## 2022-05-20 DIAGNOSIS — Z95828 Presence of other vascular implants and grafts: Secondary | ICD-10-CM

## 2022-05-20 DIAGNOSIS — E538 Deficiency of other specified B group vitamins: Secondary | ICD-10-CM | POA: Insufficient documentation

## 2022-05-20 LAB — CMP (CANCER CENTER ONLY)
ALT: 22 U/L (ref 0–44)
AST: 28 U/L (ref 15–41)
Albumin: 4.1 g/dL (ref 3.5–5.0)
Alkaline Phosphatase: 60 U/L (ref 38–126)
Anion gap: 6 (ref 5–15)
BUN: 17 mg/dL (ref 8–23)
CO2: 29 mmol/L (ref 22–32)
Calcium: 9.4 mg/dL (ref 8.9–10.3)
Chloride: 103 mmol/L (ref 98–111)
Creatinine: 0.68 mg/dL (ref 0.44–1.00)
GFR, Estimated: >60 mL/min (ref >60)
Glucose, Bld: 92 mg/dL (ref 70–99)
Potassium: 3.8 mmol/L (ref 3.5–5.1)
Sodium: 138 mmol/L (ref 135–145)
Total Bilirubin: 0.9 mg/dL (ref 0.3–1.2)
Total Protein: 7.1 g/dL (ref 6.5–8.1)

## 2022-05-20 LAB — CBC WITH DIFFERENTIAL (CANCER CENTER ONLY)
Abs Immature Granulocytes: 0.01 10*3/uL (ref 0.00–0.07)
Basophils Absolute: 0 10*3/uL (ref 0.0–0.1)
Basophils Relative: 1 %
Eosinophils Absolute: 0.1 10*3/uL (ref 0.0–0.5)
Eosinophils Relative: 2 %
HCT: 35 % — ABNORMAL LOW (ref 36.0–46.0)
Hemoglobin: 11.4 g/dL — ABNORMAL LOW (ref 12.0–15.0)
Immature Granulocytes: 0 %
Lymphocytes Relative: 33 %
Lymphs Abs: 1.4 10*3/uL (ref 0.7–4.0)
MCH: 30.9 pg (ref 26.0–34.0)
MCHC: 32.6 g/dL (ref 30.0–36.0)
MCV: 94.9 fL (ref 80.0–100.0)
Monocytes Absolute: 0.4 10*3/uL (ref 0.1–1.0)
Monocytes Relative: 9 %
Neutro Abs: 2.4 10*3/uL (ref 1.7–7.7)
Neutrophils Relative %: 55 %
Platelet Count: 199 10*3/uL (ref 150–400)
RBC: 3.69 MIL/uL — ABNORMAL LOW (ref 3.87–5.11)
RDW: 13.1 % (ref 11.5–15.5)
WBC Count: 4.3 10*3/uL (ref 4.0–10.5)
nRBC: 0 % (ref 0.0–0.2)

## 2022-05-20 LAB — RAD ONC ARIA SESSION SUMMARY
Course Elapsed Days: 14
Plan Fractions Treated to Date: 5
Plan Prescribed Dose Per Fraction: 2 Gy
Plan Total Fractions Prescribed: 12
Plan Total Prescribed Dose: 24 Gy
Reference Point Dosage Given to Date: 20 Gy
Reference Point Session Dosage Given: 2 Gy
Session Number: 10

## 2022-05-20 MED ORDER — DIPHENHYDRAMINE HCL 25 MG PO CAPS
25.0000 mg | ORAL_CAPSULE | Freq: Once | ORAL | Status: AC
  Administered 2022-05-20: 25 mg via ORAL
  Filled 2022-05-20: qty 1

## 2022-05-20 MED ORDER — HEPARIN SOD (PORK) LOCK FLUSH 100 UNIT/ML IV SOLN
500.0000 [IU] | Freq: Once | INTRAVENOUS | Status: AC | PRN
  Administered 2022-05-20: 500 [IU]

## 2022-05-20 MED ORDER — SODIUM CHLORIDE 0.9% FLUSH
10.0000 mL | Freq: Once | INTRAVENOUS | Status: AC
  Administered 2022-05-20: 10 mL

## 2022-05-20 MED ORDER — SODIUM CHLORIDE 0.9 % IV SOLN
3.6000 mg/kg | Freq: Once | INTRAVENOUS | Status: AC
  Administered 2022-05-20: 220 mg via INTRAVENOUS
  Filled 2022-05-20: qty 8

## 2022-05-20 MED ORDER — SODIUM CHLORIDE 0.9% FLUSH
10.0000 mL | INTRAVENOUS | Status: DC | PRN
  Administered 2022-05-20: 10 mL

## 2022-05-20 MED ORDER — PROCHLORPERAZINE MALEATE 10 MG PO TABS
10.0000 mg | ORAL_TABLET | Freq: Once | ORAL | Status: AC
  Administered 2022-05-20: 10 mg via ORAL
  Filled 2022-05-20: qty 1

## 2022-05-20 MED ORDER — ACETAMINOPHEN 325 MG PO TABS
650.0000 mg | ORAL_TABLET | Freq: Once | ORAL | Status: AC
  Administered 2022-05-20: 650 mg via ORAL
  Filled 2022-05-20: qty 2

## 2022-05-20 MED ORDER — SODIUM CHLORIDE 0.9 % IV SOLN: Freq: Once | INTRAVENOUS | Status: AC

## 2022-05-20 MED ORDER — RADIAPLEXRX EX GEL: Freq: Once | CUTANEOUS | Status: AC

## 2022-05-20 NOTE — Assessment & Plan Note (Signed)
Sydney Key is an 88 year old woman with history of stage IIa HER2 positive breast cancer diagnosed in May 2023, status post Taxol Herceptin, followed by right mastectomy indicating residual tumor, now continues on Kadcyla therapy every [redacted] weeks along with adjuvant radiation.  [name] is doing well today.  She has no clinical or radiographic signs of breast cancer recurrence.  She continues on Kadcyla every 3 weeks with good tolerance.  Her most recent echo from October demonstrates a slight decline in her ejection fraction.  It is reassuring that the global longitudinal strain was normal.  However due to her age and the decline we will have her see Dr. Harl Bowie in cardiology for oversight.  I placed a referral today and also sent Dr. Harl Bowie a message.  He is also undergoing adjuvant radiation and is tolerating this well.  She is due to this therapy on June 13, 2022.  We will see [name] back in 3 weeks for labs, follow-up, and her next treatment.

## 2022-05-20 NOTE — Progress Notes (Signed)
Per Dr. Lindi Adie and Mendel Ryder NP, okay to treat with echo from 10/13 50-55%

## 2022-05-20 NOTE — Patient Instructions (Signed)
[facility]  Discharge Instructions: Thank you for choosing [facility] to provide your oncology and hematology care.   If you have a lab appointment with the [facility], please go directly to the [facility] and check in at the registration area.   Wear comfortable clothing and clothing appropriate for easy access to any Portacath or PICC line.   We strive to give you quality time with your provider. You may need to reschedule your appointment if you arrive late (15 or more minutes).  Arriving late affects you and other patients whose appointments are after yours.  Also, if you miss three or more appointments without notifying the office, you may be dismissed from the clinic at the provider's discretion.      For prescription refill requests, have your pharmacy contact our office and allow 72 hours for refills to be completed.    Today you received the following chemotherapy and/or immunotherapy agents kadcyla      To help prevent nausea and vomiting after your treatment, we encourage you to take your nausea medication as directed.  BELOW ARE SYMPTOMS THAT SHOULD BE REPORTED IMMEDIATELY: *FEVER GREATER THAN 100.4 F (38 C) OR HIGHER *CHILLS OR SWEATING *NAUSEA AND VOMITING THAT IS NOT CONTROLLED WITH YOUR NAUSEA MEDICATION *UNUSUAL SHORTNESS OF BREATH *UNUSUAL BRUISING OR BLEEDING *URINARY PROBLEMS (pain or burning when urinating, or frequent urination) *BOWEL PROBLEMS (unusual diarrhea, constipation, pain near the anus) TENDERNESS IN MOUTH AND THROAT WITH OR WITHOUT PRESENCE OF ULCERS (sore throat, sores in mouth, or a toothache) UNUSUAL RASH, SWELLING OR PAIN  UNUSUAL VAGINAL DISCHARGE OR ITCHING   Items with * indicate a potential emergency and should be followed up as soon as possible or go to the Emergency Department if any problems should occur.  Please show the CHEMOTHERAPY ALERT CARD or IMMUNOTHERAPY ALERT CARD at check-in to the  Emergency Department and triage nurse.  Should you have questions after your visit or need to cancel or reschedule your appointment, please contact [facility]  Dept: [phone]  and follow the prompts.  Office hours are 8:00 a.m. to 4:30 p.m. Monday - Friday. Please note that voicemails left after 4:00 p.m. may not be returned until the following business day.  We are closed weekends and major holidays. You have access to a nurse at all times for urgent questions. Please call the main number to the clinic Dept: [phone] and follow the prompts.   For any non-urgent questions, you may also contact your provider using MyChart. We now offer e-Visits for anyone 11 and older to request care online for non-urgent symptoms. For details visit [email or web address].   Also download the MyChart app! Go to the app store, search "MyChart", open the app, select [facility], and log in with your MyChart username and password.  Masks are optional in the cancer centers. If you would like for your care team to wear a mask while they are taking care of you, please let them know. You may have one support Sydney Key who is at least 86 years old accompany you for your appointments.

## 2022-05-20 NOTE — Progress Notes (Signed)
[place] [facility] Follow up:    Sydney Bott, MD [address] [place] [address]   DIAGNOSIS:  Cancer Staging  Malignant neoplasm of lower-inner quadrant of right breast of female, estrogen receptor negative ([place]) Staging form: Breast, AJCC 8th Edition - Clinical: Stage IIA (cT2, cN0, cM0, G3, ER-, PR-, HER2+) - Signed by Nicholas Lose, MD on 12/05/2021 Stage prefix: Initial diagnosis Histologic grading system: 3 grade system   SUMMARY OF ONCOLOGIC HISTORY: Oncology History  Malignant neoplasm of lower-inner quadrant of right breast of female, estrogen receptor negative ([place])  11/28/2021 Initial Diagnosis   Palpable right breast lump 2.6 cm with calcifications at 3 o'clock position, additional 1.3 cm lesion is an intramammary lymph node biopsy: Benign, axilla negative, biopsy of [place] breast lump: Grade 3 IDC ER 0%, PR 0%, Ki-67 98%, HER2 3+ positive   12/05/2021 Cancer Staging   Staging form: Breast, AJCC 8th Edition - Clinical: Stage IIA (cT2, cN0, cM0, G3, ER-, PR-, HER2+) - Signed by Nicholas Lose, MD on 12/05/2021 Stage prefix: Initial diagnosis Histologic grading system: 3 grade system   12/26/2021 - 03/12/2022 Chemotherapy   Key is on Treatment Plan : BREAST Paclitaxel + Trastuzumab q7d / Trastuzumab q21d      Genetic Testing   [facility] CustomNext Panel was Negative. Report date is 12/18/2021.  [place] CustomNext-Cancer+RNAinsight panel offered by [facility] includes sequencing and rearrangement analysis for Sydney following 47 genes:  APC, ATM, AXIN2, BARD1, BMPR1A, BRCA1, BRCA2, BRIP1, CDH1, CDK4, CDKN2A, CHEK2, CTNNA1, DICER1, EPCAM, GREM1, HOXB13, KIT, MEN1, MLH1, MSH2, MSH3, MSH6, MUTYH, NBN, NF1, NTHL1, PALB2, PDGFRA, PMS2, POLD1, POLE, PTEN, RAD50, RAD51C, RAD51D, SDHA, SDHB, SDHC, SDHD, SMAD4, SMARCA4, STK11, TP53, TSC1, TSC2, and VHL.  RNA data is routinely analyzed for use in variant interpretation for all genes.   03/27/2022 Surgery    Right breast mastectomy: Invasive ductal carcinoma Nottingham grade 3 with no morphologic evidence of neoadjuvant chemotherapy effect negative for lymphovascular and perineural invasion Sydney size noted was 4.2 x 3.7 x 3.5 cm margins were negative and 2 lymph nodes biopsied were negative. Cancer staging: ypT2 pN0 pM0   04/08/2022 -  Chemotherapy   Key is on Treatment Plan : BREAST ADO-Trastuzumab Emtansine (Kadcyla) q21d     05/06/2022 - 06/13/2022 Radiation Therapy   Adjuvant radiation therapy     CURRENT THERAPY: Kadcyla  INTERVAL HISTORY: Sydney Key 88 y.o. female returns for follow-up and evaluation prior to receiving Kadcyla therapy.  Her most recent echocardiogram was completed on April 25, 2022 and showed a left ventricular ejection fraction of 50 to 55%.  Global longitudinal strain was normal.  Sydney Key is doing moderately well today.  Sydney Key denies any significant issues and is undergoing radiation as well.  Sydney Key says that her breast is a little pink but otherwise Sydney skin is intact and Sydney Key is tolerating radiation well.   Key Active Problem List   Diagnosis Date Noted   Hypercholesteremia 05/20/2022   Vitamin B12 deficiency 05/20/2022   Port-A-Cath in place 01/09/2022   Genetic testing 12/21/2021   Family history of breast cancer 12/06/2021   Malignant neoplasm of lower-inner quadrant of right breast of female, estrogen receptor negative ([place]) 12/04/2021   Resides in long term care facility 08/18/2020   Closed subcapital fracture of neck of femur, left, initial encounter ([place]) 06/06/2020   SBO (small bowel obstruction) ([place]) 04/08/2020   Chronic right shoulder pain 01/31/2020   Glaucoma of both eyes 01/31/2020  Restless legs syndrome 01/25/2019   Skin lesion of left leg 01/25/2019   Bilateral hearing loss 10/02/2015   Osteopenia after menopause 06/08/2014   GERD (gastroesophageal reflux disease) 01/25/2013   Chronic low back pain 01/24/2013   Colon polyps 01/24/2013     is allergic to paclitaxel.  MEDICAL HISTORY: Past Medical History:  Diagnosis Date   Anxiety    Cancer [facility]) 2023   Right breast cancer   History of kidney stones     SURGICAL HISTORY: Past Surgical History:  Procedure Laterality Date   ABDOMINAL HYSTERECTOMY  age 89   COLONOSCOPY N/A 05/14/2013   Procedure: COLONOSCOPY;  Surgeon: Cleotis Nipper, MD;  Location: [facility];  Service: Endoscopy;  Laterality: N/A;   EYE SURGERY Left 07/09/2011   retina detachment   FOOT SURGERY Right    "shaved down" bone on Great toe   HOT HEMOSTASIS N/A 05/14/2013   Procedure: HOT HEMOSTASIS (ARGON PLASMA COAGULATION/BICAP);  Surgeon: Cleotis Nipper, MD;  Location[facility];  Service: Endoscopy;  Laterality: N/A;   PORTACATH PLACEMENT Right 01/04/2022   Procedure: PORT PLACEMENT WITH ULTRASOUND GUIDANCE;  Surgeon: Erroll Luna, MD;  Location: [place];  Service: General;  Laterality: Right;   SIMPLE MASTECTOMY WITH AXILLARY SENTINEL NODE BIOPSY Right 03/27/2022   Procedure: RIGHT SIMPLE MASTECTOMY WITH RIGHT SENTINEL LYMPH NODE BIOPSY;  Surgeon: Erroll Luna, MD;  Location: [place];  Service: General;  Laterality: Right;    SOCIAL HISTORY: Social History   Socioeconomic History   Marital status: Widowed    Spouse name: Not on file   Number of children: 3   Years of education: Not on file   Highest education level: Not on file  Occupational History   Not on file  Tobacco Use   Smoking status: Never   Smokeless tobacco: Never  Vaping Use   Vaping Use: Never used  Substance and Sexual Activity   Alcohol use: No   Drug use: No   Sexual activity: Not on file  Other Topics Concern   Not on file  Social History Narrative   Not on file   Social Determinants of Health   Financial Resource Strain: Not on file  Food Insecurity: No Food Insecurity (03/27/2022)   Hunger Vital Sign    Worried About Running Out of Food in [place] Last Year: Never true    Ran  Out of Food in [place] Last Year: Never true  Transportation Needs: No Transportation Needs (03/27/2022)   PRAPARE - Hydrologist (Medical): No    Lack of Transportation (Non-Medical): No  Physical Activity: Not on file  Stress: Not on file  Social Connections: Not on file  Intimate Partner Violence: Not At Risk (03/27/2022)   Humiliation, Afraid, Rape, and Kick questionnaire    Fear of Current or Ex-Partner: No    Emotionally Abused: No    Physically Abused: No    Sexually Abused: No    FAMILY HISTORY: Family History  Problem Relation Age of Onset   Cancer Mother 100       unknown type- Sydney cancer was on her neck   Colon cancer Father 23   Breast cancer Sister 79   Breast cancer Niece 45   Breast cancer Niece 57   Breast cancer Daughter 60       negative genetic testing    Review of Systems  Constitutional:  Negative for appetite change, chills, fatigue, fever and unexpected weight change.  HENT:  Negative for hearing loss, lump/mass and trouble swallowing.   Eyes:  Negative for eye problems and icterus.  Respiratory:  Negative for chest tightness, cough and shortness of breath.   Cardiovascular:  Negative for chest pain, leg swelling and palpitations.  Gastrointestinal:  Negative for abdominal distention, abdominal pain, constipation, diarrhea, nausea and vomiting.  Endocrine: Negative for hot flashes.  Genitourinary:  Negative for difficulty urinating.   Musculoskeletal:  Negative for arthralgias.  Skin:  Negative for itching and rash.  Neurological:  Negative for dizziness, extremity weakness, headaches and numbness.  Hematological:  Negative for adenopathy. Does not bruise/bleed easily.  Psychiatric/Behavioral:  Negative for depression. Sydney Key is not nervous/anxious.       PHYSICAL EXAMINATION  ECOG PERFORMANCE STATUS: 1 - Symptomatic but completely ambulatory  Vitals:   05/20/22 1045  BP: (!) 121/50  Pulse: 63  Resp: 18  Temp:  97.8 F (36.6 C)  SpO2: 99%    Physical Exam Constitutional:      General: Sydney Key is not in acute distress.    Appearance: Normal appearance. Sydney Key is not toxic-appearing.  HENT:     Head: Normocephalic and atraumatic.  Eyes:     General: No scleral icterus. Cardiovascular:     Rate and Rhythm: Normal rate and regular rhythm.     Pulses: Normal pulses.     Heart sounds: Normal heart sounds.  Pulmonary:     Effort: Pulmonary effort is normal.     Breath sounds: Normal breath sounds.  Abdominal:     General: Abdomen is flat. Bowel sounds are normal. There is no distension.     Palpations: Abdomen is soft.     Tenderness: There is no abdominal tenderness.  Musculoskeletal:        General: No swelling.     Cervical back: Neck supple.  Lymphadenopathy:     Cervical: No cervical adenopathy.  Skin:    General: Skin is warm and dry.     Findings: No rash.  Neurological:     General: No focal deficit present.     Mental Status: Sydney Key is alert.  Psychiatric:        Mood and Affect: Mood normal.        Behavior: Behavior normal.     LABORATORY DATA:  CBC    Component Value Date/Time   WBC 4.3 05/20/2022 1024   WBC 4.0 03/22/2022 1430   RBC 3.69 (L) 05/20/2022 1024   HGB 11.4 (L) 05/20/2022 1024   HCT 35.0 (L) 05/20/2022 1024   PLT 199 05/20/2022 1024   MCV 94.9 05/20/2022 1024   MCH 30.9 05/20/2022 1024   MCHC 32.6 05/20/2022 1024   RDW 13.1 05/20/2022 1024   LYMPHSABS 1.4 05/20/2022 1024   MONOABS 0.4 05/20/2022 1024   EOSABS 0.1 05/20/2022 1024   BASOSABS 0.0 05/20/2022 1024    CMP     Component Value Date/Time   NA 139 04/29/2022 1019   K 4.0 04/29/2022 1019   CL 105 04/29/2022 1019   CO2 28 04/29/2022 1019   GLUCOSE 98 04/29/2022 1019   BUN 16 04/29/2022 1019   CREATININE 0.70 04/29/2022 1019   CALCIUM 9.5 04/29/2022 1019   PROT 6.7 04/29/2022 1019   ALBUMIN 3.8 04/29/2022 1019   AST 25 04/29/2022 1019   ALT 16 04/29/2022 1019   ALKPHOS 67 04/29/2022  1019   BILITOT 0.8 04/29/2022 1019   GFRNONAA >60 04/29/2022 1019          ASSESSMENT and THERAPY  PLAN:   Malignant neoplasm of lower-inner quadrant of right breast of female, estrogen receptor negative ([place]) Sydney Key is an 88 year old woman with history of stage IIa HER2 positive breast cancer diagnosed in May 2023, status post Taxol Herceptin, followed by right mastectomy indicating residual tumor, now continues on Kadcyla therapy every [redacted] weeks along with adjuvant radiation.  Sydney Key is doing well today.  Sydney Key has no clinical or radiographic signs of breast cancer recurrence.  Sydney Key continues on Kadcyla every 3 weeks with good tolerance.  Her most recent echo from October demonstrates a slight decline in her ejection fraction.  It is reassuring that Sydney global longitudinal strain was normal.  However due to her age and Sydney decline we will have her see Dr. Harl Bowie in cardiology for oversight.  I placed a referral today and also sent Dr. Harl Bowie a message.  He is also undergoing adjuvant radiation and is tolerating this well.  Sydney Key is due to this therapy on June 13, 2022.  We will see Sydney Key back in 3 weeks for labs, follow-up, and her next treatment.  All questions were answered. Sydney Key knows to call Sydney clinic with any problems, questions or concerns. We can certainly see Sydney Key much sooner if necessary.  Total encounter time:30 minutes*in face-to-face visit time, chart review, lab review, care coordination, order entry, and documentation of Sydney encounter time.    Wilber Bihari, NP 05/20/22 11:19 AM Medical Oncology and Hematology [facility] [place],  [address] Tel. [phone]    Fax. [phone]  *Total Encounter Time as defined by [place] [facility] for Medicare and Medicaid Services includes, in addition to Sydney face-to-face time of a Key visit (documented in Sydney note above) non-face-to-face time: obtaining and reviewing outside history, ordering  and reviewing medications, tests or procedures, care coordination (communications with other health care professionals or caregivers) and documentation in Sydney medical record.

## 2022-05-21 ENCOUNTER — Other Ambulatory Visit: Payer: Self-pay

## 2022-05-21 ENCOUNTER — Ambulatory Visit
Admission: RE | Admit: 2022-05-21 | Discharge: 2022-05-21 | Disposition: A | Discharge status: Home / Self Care | Payer: Medicare Other | Source: Ambulatory Visit | Attending: Radiation Oncology | Admitting: Radiation Oncology

## 2022-05-21 DIAGNOSIS — C50911 Malignant neoplasm of unspecified site of right female breast: Secondary | ICD-10-CM | POA: Diagnosis not present

## 2022-05-21 DIAGNOSIS — Z510 Encounter for antineoplastic radiation therapy: Secondary | ICD-10-CM | POA: Diagnosis not present

## 2022-05-21 DIAGNOSIS — Z171 Estrogen receptor negative status [ER-]: Secondary | ICD-10-CM | POA: Diagnosis not present

## 2022-05-21 DIAGNOSIS — C50311 Malignant neoplasm of lower-inner quadrant of right female breast: Secondary | ICD-10-CM | POA: Diagnosis not present

## 2022-05-21 LAB — RAD ONC ARIA SESSION SUMMARY
Course Elapsed Days: 15
Plan Fractions Treated to Date: 6
Plan Prescribed Dose Per Fraction: 2 Gy
Plan Total Fractions Prescribed: 13
Plan Total Prescribed Dose: 26 Gy
Reference Point Dosage Given to Date: 22 Gy
Reference Point Session Dosage Given: 2 Gy
Session Number: 11

## 2022-05-22 ENCOUNTER — Other Ambulatory Visit: Payer: Self-pay

## 2022-05-22 ENCOUNTER — Encounter: Payer: Self-pay | Admitting: Internal Medicine

## 2022-05-22 ENCOUNTER — Ambulatory Visit
Admission: RE | Admit: 2022-05-22 | Discharge: 2022-05-22 | Disposition: A | Discharge status: Home / Self Care | Payer: Medicare Other | Source: Ambulatory Visit | Attending: Radiation Oncology | Admitting: Radiation Oncology

## 2022-05-22 ENCOUNTER — Ambulatory Visit: Payer: Medicare Other | Attending: Internal Medicine | Admitting: Internal Medicine

## 2022-05-22 VITALS — BP 116/58 | HR 76 | Ht 65.5 in | Wt 138.8 lb

## 2022-05-22 DIAGNOSIS — I5030 Unspecified diastolic (congestive) heart failure: Secondary | ICD-10-CM | POA: Diagnosis not present

## 2022-05-22 DIAGNOSIS — Z5181 Encounter for therapeutic drug level monitoring: Secondary | ICD-10-CM | POA: Diagnosis not present

## 2022-05-22 DIAGNOSIS — Z79899 Other long term (current) drug therapy: Secondary | ICD-10-CM | POA: Diagnosis not present

## 2022-05-22 DIAGNOSIS — C50311 Malignant neoplasm of lower-inner quadrant of right female breast: Secondary | ICD-10-CM | POA: Diagnosis not present

## 2022-05-22 DIAGNOSIS — Z510 Encounter for antineoplastic radiation therapy: Secondary | ICD-10-CM | POA: Diagnosis not present

## 2022-05-22 DIAGNOSIS — Z171 Estrogen receptor negative status [ER-]: Secondary | ICD-10-CM | POA: Diagnosis not present

## 2022-05-22 LAB — RAD ONC ARIA SESSION SUMMARY
Course Elapsed Days: 16
Plan Fractions Treated to Date: 6
Plan Prescribed Dose Per Fraction: 2 Gy
Plan Total Fractions Prescribed: 12
Plan Total Prescribed Dose: 24 Gy
Reference Point Dosage Given to Date: 24 Gy
Reference Point Session Dosage Given: 2 Gy
Session Number: 12

## 2022-05-22 MED ORDER — CARVEDILOL 3.125 MG PO TABS: 3.1250 mg | ORAL_TABLET | Freq: Two times a day (BID) | ORAL | 3 refills | Status: DC

## 2022-05-22 NOTE — Patient Instructions (Signed)
Medication Instructions:  Start Carvedilol 3.125 mg twice daily *If you need a refill on your cardiac medications before your next appointment, please call your pharmacy*   Lab Work: Your physician recommends that you complete lab work today. BNP  If you have labs (blood work) drawn today and your tests are completely normal, you will receive your results only by: MyChart Message (if you have MyChart) OR A paper copy in the mail If you have any lab test that is abnormal or we need to change your treatment, we will call you to review the results.   Testing/Procedures: Your physician has requested that you have an echocardiogram. Echocardiography is a painless test that uses sound waves to create images of your heart. It provides your doctor with information about the size and shape of your heart and how well your heart's chambers and valves are working. This procedure takes approximately one hour. There are no restrictions for this procedure. Please do NOT wear cologne, perfume, aftershave, or lotions (deodorant is allowed). Please arrive 15 minutes prior to your appointment time.    Follow-Up: At [facility], you and your health needs are our priority.  As part of our continuing mission to provide you with exceptional heart care, we have created designated Provider Care Teams.  These Care Teams include your primary Cardiologist (physician) and Advanced Practice Providers (APPs -  Physician Assistants and Nurse Practitioners) who all work together to provide you with the care you need, when you need it.  We recommend signing up for the patient portal called "MyChart".  Sign up information is provided on this After Visit Summary.  MyChart is used to connect with patients for Virtual Visits (Telemedicine).  Patients are able to view lab/test results, encounter notes, upcoming appointments, etc.  Non-urgent messages can be sent to your provider as well.   To learn more about what you  can do with MyChart, go to [email or web address].    Your next appointment:   6 month(s)  The format for your next appointment:   In Person  Provider:   Phineas Inches, MD    Other Instructions  Important Information About Sugar

## 2022-05-22 NOTE — Progress Notes (Signed)
Cardiology Office Note:    Date:  05/22/2022   ID:  Sydney Key, DOB 1930/12/03, MRN [id number]  PCP:  Raelene Bott, MD   [place] Providers Cardiologist:  None     Referring MD: Raelene Bott, MD   No chief complaint on file. Reduced GLS/EF on herceptin  History of Present Illness:    Sydney Key is a 88 y.o. female with a hx of R breast cancer diagnosed May 2023 , stage IIA ER/PR (-), HER2 (+) , 12/26/2021-03/12/2022 paclitaxel + herceptin, now on herceptin until 12/27/2022, s/p R mastectomy /adjuvant RT 05/06/2022-06/13/2022, no cardiac dx hx.. She has had surveillance echo and noted to have reduced strain and mild EF reduction. She is a non smoker. She is active. She comes in with her daughter today and is feeling well. No CP, SOB, PND/orthopnea, no LE edema.  Cardiology Studies  TTE 12/13/2021 EF 60-65%, GLS -21.7%, nl RV fxn, RVSP 31 mmHg 04/25/2022 EF closer to 55%, GLS -17.7%, RVSP 24.5 mmHg  Past Medical History:  Diagnosis Date   Anxiety    Cancer ([place]) 2023   Right breast cancer   History of kidney stones     Past Surgical History:  Procedure Laterality Date   ABDOMINAL HYSTERECTOMY  age 74   COLONOSCOPY N/A 05/14/2013   Procedure: COLONOSCOPY;  Surgeon: Cleotis Nipper, MD;  [facility];  Service: Endoscopy;  Laterality: N/A;   EYE SURGERY Left 07/09/2011   retina detachment   FOOT SURGERY Right    "shaved down" bone on Great toe   HOT HEMOSTASIS N/A 05/14/2013   Procedure: HOT HEMOSTASIS (ARGON PLASMA COAGULATION/BICAP);  Surgeon: Cleotis Nipper, MD;  Location: [facility];  Service: Endoscopy;  Laterality: N/A;   PORTACATH PLACEMENT Right 01/04/2022   Procedure: PORT PLACEMENT WITH ULTRASOUND GUIDANCE;  Surgeon: Erroll Luna, MD;  Location: [place];  Service: General;  Laterality: Right;   SIMPLE MASTECTOMY WITH AXILLARY SENTINEL NODE BIOPSY Right 03/27/2022   Procedure: RIGHT SIMPLE MASTECTOMY WITH RIGHT  SENTINEL LYMPH NODE BIOPSY;  Surgeon: Erroll Luna, MD;  Location: [place];  Service: General;  Laterality: Right;    Current Medications: Current Meds  Medication Sig   acetaminophen (TYLENOL) 500 MG tablet Take 500 mg by mouth at bedtime.   Ascorbic Acid (VITAMIN C WITH ROSE HIPS) 500 MG tablet Take 500 mg by mouth daily.   aspirin EC 81 MG tablet Take 81 mg by mouth every evening.   brimonidine (ALPHAGAN) 0.15 % ophthalmic solution Place 1 drop into the left eye 2 (two) times daily.   carvedilol (COREG) 3.125 MG tablet Take 1 tablet (3.125 mg total) by mouth 2 (two) times daily.   cholecalciferol (VITAMIN D) 1000 UNITS tablet Take 1,000 Units by mouth daily.   cyanocobalamin (,VITAMIN B-12,) 1000 MCG/ML injection Inject 1,000 mcg into the muscle every 30 (thirty) days.   escitalopram (LEXAPRO) 10 MG tablet Take 5 mg by mouth at bedtime.   gabapentin (NEURONTIN) 300 MG capsule Take 300 mg by mouth at bedtime.   loperamide (IMODIUM) 2 MG capsule Take 1 capsule (2 mg total) by mouth as needed for diarrhea or loose stools.   Multiple Vitamin (MULTIVITAMIN WITH MINERALS) TABS tablet Take 1 tablet by mouth daily.     Allergies:   Paclitaxel   Social History   Socioeconomic History   Marital status: Widowed    Spouse name: Not on file   Number of children: 3   Years of education:  Not on file   Highest education level: Not on file  Occupational History   Not on file  Tobacco Use   Smoking status: Never   Smokeless tobacco: Never  Vaping Use   Vaping Use: Never used  Substance and Sexual Activity   Alcohol use: No   Drug use: No   Sexual activity: Not on file  Other Topics Concern   Not on file  Social History Narrative   Not on file   Social Determinants of Health   Financial Resource Strain: Not on file  Food Insecurity: No Food Insecurity (03/27/2022)   Hunger Vital Sign    Worried About Running Out of Food in the Last Year: Never true    Ran Out of Food in the Last  Year: Never true  Transportation Needs: No Transportation Needs (03/27/2022)   PRAPARE - Hydrologist (Medical): No    Lack of Transportation (Non-Medical): No  Physical Activity: Not on file  Stress: Not on file  Social Connections: Not on file     Family History: The patient's family history includes Breast cancer (age of onset: 66) in her niece; Breast cancer (age of onset: 59) in her daughter; Breast cancer (age of onset: 31) in her niece; Breast cancer (age of onset: 75) in her sister; Cancer (age of onset: 93) in her mother; Colon cancer (age of onset: 69) in her father.  ROS:   Please see the history of present illness.     All other systems reviewed and are negative.  EKGs/Labs/Other Studies Reviewed:    The following studies were reviewed today:   EKG:  EKG is  ordered today.  The ekg ordered today demonstrates   05/22/2022- NSR   Recent Labs: 05/20/2022: ALT 22; BUN 17; Creatinine 0.68; Hemoglobin 11.4; Platelet Count 199; Potassium 3.8; Sodium 138  Recent Lipid Panel No results found for: "CHOL", "TRIG", "HDL", "CHOLHDL", "VLDL", "LDLCALC", "LDLDIRECT"   Risk Assessment/Calculations:     Physical Exam:    VS: Vitals:   05/22/22 1413  BP: (!) 116/58  Pulse: 76  SpO2: 98%      BP (!) 116/58   Pulse 76   Ht 5' 5.5" (1.664 m)   Wt 138 lb 12.8 oz (63 kg)   SpO2 98%   BMI 22.75 kg/m     Wt Readings from Last 3 Encounters:  05/22/22 138 lb 12.8 oz (63 kg)  05/20/22 136 lb 3.2 oz (61.8 kg)  04/29/22 136 lb 4.8 oz (61.8 kg)     GEN:  Well nourished, well developed in no acute distress HEENT: Normal NECK: No JVD; No carotid bruits LYMPHATICS: No lymphadenopathy CARDIAC: RRR, no murmurs, rubs, gallops RESPIRATORY:  Clear to auscultation without rales, wheezing or rhonchi  ABDOMEN: Soft, non-tender, non-distended MUSCULOSKELETAL:  No edema; No deformity  SKIN: Warm and dry NEUROLOGIC:  Alert and oriented x 3 PSYCHIATRIC:   Normal affect   ASSESSMENT:    Cardio-Onc: she has asymptomatic CTRCD with 19% reduction in strain. She is asymptomatic. EF is preserved. Low risk of accelerated coronary dx with R sided radiation. Will recommend a very low dose of a BB with supportive cardioprotective data in breast cancer patients. We discussed that if she experiences LH with standing, low BPs etc to let us know and can stop her coreg. - start coreg 3.125 mg BID - TTE limited with GLS 07/2022 - BNP today, will repeat BNP in follow-up - she can continue herceptin therapy, I  can continue to monitor her EF and GLS until 12/2022, then 1 year following tx.  PLAN:    In order of problems listed above:  TTE limited EF, GLS in January 2024 BNP Carvediolol 3.125 mg BID Follow up 6 months      Medication Adjustments/Labs and Tests Ordered: Current medicines are reviewed at length with the patient today.  Concerns regarding medicines are outlined above.  Orders Placed This Encounter  Procedures   Brain natriuretic peptide   EKG 12-Lead   ECHOCARDIOGRAM LIMITED   Meds ordered this encounter  Medications   carvedilol (COREG) 3.125 MG tablet    Sig: Take 1 tablet (3.125 mg total) by mouth 2 (two) times daily.    Dispense:  180 tablet    Refill:  3    Patient Instructions  Medication Instructions:  Start Carvedilol 3.125 mg twice daily *If you need a refill on your cardiac medications before your next appointment, please call your pharmacy*   Lab Work: Your physician recommends that you complete lab work today. BNP  If you have labs (blood work) drawn today and your tests are completely normal, you will receive your results only by: MyChart Message (if you have MyChart) OR A paper copy in the mail If you have any lab test that is abnormal or we need to change your treatment, we will call you to review the results.   Testing/Procedures: Your physician has requested that you have an echocardiogram.  Echocardiography is a painless test that uses sound waves to create images of your heart. It provides your doctor with information about the size and shape of your heart and how well your heart's chambers and valves are working. This procedure takes approximately one hour. There are no restrictions for this procedure. Please do NOT wear cologne, perfume, aftershave, or lotions (deodorant is allowed). Please arrive 15 minutes prior to your appointment time.    Follow-Up: At [facility], you and your health needs are our priority.  As part of our continuing mission to provide you with exceptional heart care, we have created designated Provider Care Teams.  These Care Teams include your primary Cardiologist (physician) and Advanced Practice Providers (APPs -  Physician Assistants and Nurse Practitioners) who all work together to provide you with the care you need, when you need it.  We recommend signing up for the patient portal called "MyChart".  Sign up information is provided on this After Visit Summary.  MyChart is used to connect with patients for Virtual Visits (Telemedicine).  Patients are able to view lab/test results, encounter notes, upcoming appointments, etc.  Non-urgent messages can be sent to your provider as well.   To learn more about what you can do with MyChart, go to [email or web address].    Your next appointment:   6 month(s)  The format for your next appointment:   In Person  Provider:   Phineas Inches, MD    Other Instructions  Important Information About Sugar         Signed, Janina Mayo, MD  05/22/2022 4:52 PM    [place]

## 2022-05-23 ENCOUNTER — Other Ambulatory Visit: Payer: Self-pay

## 2022-05-23 ENCOUNTER — Ambulatory Visit
Admission: RE | Admit: 2022-05-23 | Discharge: 2022-05-23 | Disposition: A | Discharge status: Home / Self Care | Payer: Medicare Other | Source: Ambulatory Visit | Attending: Radiation Oncology | Admitting: Radiation Oncology

## 2022-05-23 DIAGNOSIS — C50311 Malignant neoplasm of lower-inner quadrant of right female breast: Secondary | ICD-10-CM | POA: Diagnosis not present

## 2022-05-23 DIAGNOSIS — Z171 Estrogen receptor negative status [ER-]: Secondary | ICD-10-CM | POA: Diagnosis not present

## 2022-05-23 DIAGNOSIS — Z510 Encounter for antineoplastic radiation therapy: Secondary | ICD-10-CM | POA: Diagnosis not present

## 2022-05-23 LAB — RAD ONC ARIA SESSION SUMMARY
Course Elapsed Days: 17
Plan Fractions Treated to Date: 7
Plan Prescribed Dose Per Fraction: 2 Gy
Plan Total Fractions Prescribed: 13
Plan Total Prescribed Dose: 26 Gy
Reference Point Dosage Given to Date: 26 Gy
Reference Point Session Dosage Given: 2 Gy
Session Number: 13

## 2022-05-24 ENCOUNTER — Other Ambulatory Visit: Payer: Self-pay

## 2022-05-24 ENCOUNTER — Ambulatory Visit
Admission: RE | Admit: 2022-05-24 | Discharge: 2022-05-24 | Disposition: A | Discharge status: Home / Self Care | Payer: Medicare Other | Source: Ambulatory Visit | Attending: Radiation Oncology | Admitting: Radiation Oncology

## 2022-05-24 DIAGNOSIS — C50311 Malignant neoplasm of lower-inner quadrant of right female breast: Secondary | ICD-10-CM | POA: Diagnosis not present

## 2022-05-24 DIAGNOSIS — Z171 Estrogen receptor negative status [ER-]: Secondary | ICD-10-CM | POA: Diagnosis not present

## 2022-05-24 DIAGNOSIS — Z510 Encounter for antineoplastic radiation therapy: Secondary | ICD-10-CM | POA: Diagnosis not present

## 2022-05-24 LAB — BRAIN NATRIURETIC PEPTIDE: BNP: 55.1 pg/mL (ref 0.0–100.0)

## 2022-05-24 LAB — RAD ONC ARIA SESSION SUMMARY
Course Elapsed Days: 18
Plan Fractions Treated to Date: 7
Plan Prescribed Dose Per Fraction: 2 Gy
Plan Total Fractions Prescribed: 12
Plan Total Prescribed Dose: 24 Gy
Reference Point Dosage Given to Date: 28 Gy
Reference Point Session Dosage Given: 2 Gy
Session Number: 14

## 2022-05-27 ENCOUNTER — Ambulatory Visit: Payer: Medicare Other

## 2022-05-27 ENCOUNTER — Other Ambulatory Visit: Payer: Self-pay

## 2022-05-27 ENCOUNTER — Ambulatory Visit
Admission: RE | Admit: 2022-05-27 | Discharge: 2022-05-27 | Disposition: A | Discharge status: Home / Self Care | Payer: Medicare Other | Source: Ambulatory Visit | Attending: Radiation Oncology | Admitting: Radiation Oncology

## 2022-05-27 DIAGNOSIS — C50311 Malignant neoplasm of lower-inner quadrant of right female breast: Secondary | ICD-10-CM | POA: Diagnosis not present

## 2022-05-27 DIAGNOSIS — Z510 Encounter for antineoplastic radiation therapy: Secondary | ICD-10-CM | POA: Diagnosis not present

## 2022-05-27 DIAGNOSIS — Z171 Estrogen receptor negative status [ER-]: Secondary | ICD-10-CM | POA: Diagnosis not present

## 2022-05-27 LAB — RAD ONC ARIA SESSION SUMMARY
Course Elapsed Days: 21
Plan Fractions Treated to Date: 8
Plan Prescribed Dose Per Fraction: 2 Gy
Plan Total Fractions Prescribed: 13
Plan Total Prescribed Dose: 26 Gy
Reference Point Dosage Given to Date: 30 Gy
Reference Point Session Dosage Given: 2 Gy
Session Number: 15

## 2022-05-28 ENCOUNTER — Other Ambulatory Visit: Payer: Self-pay

## 2022-05-28 ENCOUNTER — Ambulatory Visit
Admission: RE | Admit: 2022-05-28 | Discharge: 2022-05-28 | Disposition: A | Discharge status: Home / Self Care | Payer: Medicare Other | Source: Ambulatory Visit | Attending: Radiation Oncology | Admitting: Radiation Oncology

## 2022-05-28 DIAGNOSIS — Z510 Encounter for antineoplastic radiation therapy: Secondary | ICD-10-CM | POA: Diagnosis not present

## 2022-05-28 DIAGNOSIS — C50311 Malignant neoplasm of lower-inner quadrant of right female breast: Secondary | ICD-10-CM | POA: Diagnosis not present

## 2022-05-28 DIAGNOSIS — Z171 Estrogen receptor negative status [ER-]: Secondary | ICD-10-CM | POA: Diagnosis not present

## 2022-05-28 LAB — RAD ONC ARIA SESSION SUMMARY
Course Elapsed Days: 22
Plan Fractions Treated to Date: 8
Plan Prescribed Dose Per Fraction: 2 Gy
Plan Total Fractions Prescribed: 12
Plan Total Prescribed Dose: 24 Gy
Reference Point Dosage Given to Date: 32 Gy
Reference Point Session Dosage Given: 2 Gy
Session Number: 16

## 2022-05-29 ENCOUNTER — Ambulatory Visit
Admission: RE | Admit: 2022-05-29 | Discharge: 2022-05-29 | Disposition: A | Discharge status: Home / Self Care | Payer: Medicare Other | Source: Ambulatory Visit | Attending: Radiation Oncology | Admitting: Radiation Oncology

## 2022-05-29 ENCOUNTER — Other Ambulatory Visit: Payer: Self-pay

## 2022-05-29 DIAGNOSIS — C50311 Malignant neoplasm of lower-inner quadrant of right female breast: Secondary | ICD-10-CM | POA: Diagnosis not present

## 2022-05-29 DIAGNOSIS — Z510 Encounter for antineoplastic radiation therapy: Secondary | ICD-10-CM | POA: Diagnosis not present

## 2022-05-29 DIAGNOSIS — Z171 Estrogen receptor negative status [ER-]: Secondary | ICD-10-CM | POA: Diagnosis not present

## 2022-05-29 LAB — RAD ONC ARIA SESSION SUMMARY
Course Elapsed Days: 23
Plan Fractions Treated to Date: 9
Plan Prescribed Dose Per Fraction: 2 Gy
Plan Total Fractions Prescribed: 13
Plan Total Prescribed Dose: 26 Gy
Reference Point Dosage Given to Date: 34 Gy
Reference Point Session Dosage Given: 2 Gy
Session Number: 17

## 2022-05-30 ENCOUNTER — Ambulatory Visit: Payer: Medicare Other

## 2022-05-31 ENCOUNTER — Ambulatory Visit: Payer: Medicare Other

## 2022-06-03 ENCOUNTER — Telehealth: Payer: Self-pay | Admitting: Hematology and Oncology

## 2022-06-03 ENCOUNTER — Ambulatory Visit
Admission: RE | Admit: 2022-06-03 | Discharge: 2022-06-03 | Disposition: A | Discharge status: Home / Self Care | Payer: Medicare Other | Source: Ambulatory Visit | Attending: Radiation Oncology | Admitting: Radiation Oncology

## 2022-06-03 ENCOUNTER — Ambulatory Visit: Payer: Medicare Other | Admitting: Internal Medicine

## 2022-06-03 ENCOUNTER — Other Ambulatory Visit: Payer: Self-pay

## 2022-06-03 DIAGNOSIS — C50311 Malignant neoplasm of lower-inner quadrant of right female breast: Secondary | ICD-10-CM

## 2022-06-03 DIAGNOSIS — Z171 Estrogen receptor negative status [ER-]: Secondary | ICD-10-CM | POA: Diagnosis not present

## 2022-06-03 DIAGNOSIS — Z510 Encounter for antineoplastic radiation therapy: Secondary | ICD-10-CM | POA: Diagnosis not present

## 2022-06-03 LAB — RAD ONC ARIA SESSION SUMMARY
Course Elapsed Days: 28
Plan Fractions Treated to Date: 9
Plan Prescribed Dose Per Fraction: 2 Gy
Plan Total Fractions Prescribed: 12
Plan Total Prescribed Dose: 24 Gy
Reference Point Dosage Given to Date: 36 Gy
Reference Point Session Dosage Given: 2 Gy
Session Number: 18

## 2022-06-03 MED ORDER — SONAFINE EX EMUL
1.0000 | Freq: Two times a day (BID) | CUTANEOUS | Status: DC
  Administered 2022-06-03: 1 via TOPICAL

## 2022-06-04 ENCOUNTER — Ambulatory Visit
Admission: RE | Admit: 2022-06-04 | Discharge: 2022-06-04 | Disposition: A | Discharge status: Home / Self Care | Payer: Medicare Other | Source: Ambulatory Visit | Attending: Radiation Oncology | Admitting: Radiation Oncology

## 2022-06-04 ENCOUNTER — Other Ambulatory Visit: Payer: Self-pay

## 2022-06-04 DIAGNOSIS — Z171 Estrogen receptor negative status [ER-]: Secondary | ICD-10-CM | POA: Diagnosis not present

## 2022-06-04 DIAGNOSIS — C50311 Malignant neoplasm of lower-inner quadrant of right female breast: Secondary | ICD-10-CM | POA: Diagnosis not present

## 2022-06-04 DIAGNOSIS — Z510 Encounter for antineoplastic radiation therapy: Secondary | ICD-10-CM | POA: Diagnosis not present

## 2022-06-04 LAB — RAD ONC ARIA SESSION SUMMARY
Course Elapsed Days: 29
Plan Fractions Treated to Date: 10
Plan Prescribed Dose Per Fraction: 2 Gy
Plan Total Fractions Prescribed: 13
Plan Total Prescribed Dose: 26 Gy
Reference Point Dosage Given to Date: 38 Gy
Reference Point Session Dosage Given: 2 Gy
Session Number: 19

## 2022-06-05 ENCOUNTER — Ambulatory Visit
Admission: RE | Admit: 2022-06-05 | Discharge: 2022-06-05 | Disposition: A | Discharge status: Home / Self Care | Payer: Medicare Other | Source: Ambulatory Visit | Attending: Radiation Oncology | Admitting: Radiation Oncology

## 2022-06-05 ENCOUNTER — Other Ambulatory Visit: Payer: Self-pay

## 2022-06-05 DIAGNOSIS — Z510 Encounter for antineoplastic radiation therapy: Secondary | ICD-10-CM | POA: Diagnosis not present

## 2022-06-05 DIAGNOSIS — C50311 Malignant neoplasm of lower-inner quadrant of right female breast: Secondary | ICD-10-CM | POA: Diagnosis not present

## 2022-06-05 DIAGNOSIS — Z171 Estrogen receptor negative status [ER-]: Secondary | ICD-10-CM | POA: Diagnosis not present

## 2022-06-05 LAB — RAD ONC ARIA SESSION SUMMARY
Course Elapsed Days: 30
Plan Fractions Treated to Date: 10
Plan Prescribed Dose Per Fraction: 2 Gy
Plan Total Fractions Prescribed: 12
Plan Total Prescribed Dose: 24 Gy
Reference Point Dosage Given to Date: 40 Gy
Reference Point Session Dosage Given: 2 Gy
Session Number: 20

## 2022-06-06 ENCOUNTER — Ambulatory Visit: Payer: Medicare Other

## 2022-06-06 ENCOUNTER — Other Ambulatory Visit: Payer: Self-pay

## 2022-06-06 ENCOUNTER — Ambulatory Visit
Admission: RE | Admit: 2022-06-06 | Discharge: 2022-06-06 | Disposition: A | Discharge status: Home / Self Care | Payer: Medicare Other | Source: Ambulatory Visit | Attending: Radiation Oncology | Admitting: Radiation Oncology

## 2022-06-06 DIAGNOSIS — Z171 Estrogen receptor negative status [ER-]: Secondary | ICD-10-CM | POA: Diagnosis not present

## 2022-06-06 DIAGNOSIS — Z510 Encounter for antineoplastic radiation therapy: Secondary | ICD-10-CM | POA: Diagnosis not present

## 2022-06-06 DIAGNOSIS — C50311 Malignant neoplasm of lower-inner quadrant of right female breast: Secondary | ICD-10-CM | POA: Diagnosis not present

## 2022-06-06 LAB — RAD ONC ARIA SESSION SUMMARY
Course Elapsed Days: 31
Plan Fractions Treated to Date: 11
Plan Prescribed Dose Per Fraction: 2 Gy
Plan Total Fractions Prescribed: 13
Plan Total Prescribed Dose: 26 Gy
Reference Point Dosage Given to Date: 42 Gy
Reference Point Session Dosage Given: 2 Gy
Session Number: 21

## 2022-06-07 ENCOUNTER — Other Ambulatory Visit: Payer: Self-pay

## 2022-06-07 ENCOUNTER — Ambulatory Visit
Admission: RE | Admit: 2022-06-07 | Discharge: 2022-06-07 | Disposition: A | Discharge status: Home / Self Care | Payer: Medicare Other | Source: Ambulatory Visit | Attending: Radiation Oncology | Admitting: Radiation Oncology

## 2022-06-07 DIAGNOSIS — Z171 Estrogen receptor negative status [ER-]: Secondary | ICD-10-CM | POA: Insufficient documentation

## 2022-06-07 DIAGNOSIS — Z510 Encounter for antineoplastic radiation therapy: Secondary | ICD-10-CM | POA: Diagnosis not present

## 2022-06-07 DIAGNOSIS — E538 Deficiency of other specified B group vitamins: Secondary | ICD-10-CM | POA: Diagnosis not present

## 2022-06-07 DIAGNOSIS — C50311 Malignant neoplasm of lower-inner quadrant of right female breast: Secondary | ICD-10-CM | POA: Diagnosis not present

## 2022-06-07 LAB — RAD ONC ARIA SESSION SUMMARY
Course Elapsed Days: 32
Plan Fractions Treated to Date: 11
Plan Prescribed Dose Per Fraction: 2 Gy
Plan Total Fractions Prescribed: 12
Plan Total Prescribed Dose: 24 Gy
Reference Point Dosage Given to Date: 44 Gy
Reference Point Session Dosage Given: 2 Gy
Session Number: 22

## 2022-06-10 ENCOUNTER — Other Ambulatory Visit: Payer: Self-pay

## 2022-06-10 ENCOUNTER — Ambulatory Visit: Payer: Medicare Other

## 2022-06-10 ENCOUNTER — Encounter: Payer: Self-pay | Admitting: Physical Therapy

## 2022-06-10 ENCOUNTER — Ambulatory Visit: Payer: Medicare Other | Attending: Surgery | Admitting: Physical Therapy

## 2022-06-10 ENCOUNTER — Encounter: Payer: Self-pay | Admitting: *Deleted

## 2022-06-10 ENCOUNTER — Ambulatory Visit
Admission: RE | Admit: 2022-06-10 | Discharge: 2022-06-10 | Disposition: A | Discharge status: Home / Self Care | Payer: Medicare Other | Source: Ambulatory Visit | Attending: Radiation Oncology | Admitting: Radiation Oncology

## 2022-06-10 DIAGNOSIS — Z171 Estrogen receptor negative status [ER-]: Secondary | ICD-10-CM | POA: Diagnosis not present

## 2022-06-10 DIAGNOSIS — Z510 Encounter for antineoplastic radiation therapy: Secondary | ICD-10-CM | POA: Diagnosis not present

## 2022-06-10 DIAGNOSIS — Z0000 Encounter for general adult medical examination without abnormal findings: Secondary | ICD-10-CM | POA: Diagnosis not present

## 2022-06-10 DIAGNOSIS — Z483 Aftercare following surgery for neoplasm: Secondary | ICD-10-CM | POA: Insufficient documentation

## 2022-06-10 DIAGNOSIS — C50311 Malignant neoplasm of lower-inner quadrant of right female breast: Secondary | ICD-10-CM | POA: Diagnosis not present

## 2022-06-10 LAB — RAD ONC ARIA SESSION SUMMARY
Course Elapsed Days: 35
Plan Fractions Treated to Date: 12
Plan Prescribed Dose Per Fraction: 2 Gy
Plan Total Fractions Prescribed: 13
Plan Total Prescribed Dose: 26 Gy
Reference Point Dosage Given to Date: 46 Gy
Reference Point Session Dosage Given: 2 Gy
Session Number: 23

## 2022-06-10 NOTE — Therapy (Signed)
OUTPATIENT PHYSICAL THERAPY SOZO SCREENING NOTE   Patient Name: Sydney Key MRN: [id number] DOB:1931/07/05, 88 y.o., female Today's Date: 06/10/2022  PCP: Raelene Bott, MD REFERRING PROVIDER: Erroll Luna, MD   PT End of Session - 06/10/22 1157     Visit Number 2    Number of Visits 2    PT Start Time 1155    PT Stop Time 1205    PT Time Calculation (min) 10 min    Activity Tolerance Patient tolerated treatment well    Behavior During Therapy [facility] for tasks assessed/performed             Past Medical History:  Diagnosis Date   Anxiety    Cancer ([place]) 2023   Right breast cancer   History of kidney stones    Past Surgical History:  Procedure Laterality Date   ABDOMINAL HYSTERECTOMY  age 58   COLONOSCOPY N/A 05/14/2013   Procedure: COLONOSCOPY;  Surgeon: Cleotis Nipper, MD;  [facility]: [facility];  Service: Endoscopy;  Laterality: N/A;   EYE SURGERY Left 07/09/2011   retina detachment   FOOT SURGERY Right    "shaved down" bone on Great toe   HOT HEMOSTASIS N/A 05/14/2013   Procedure: HOT HEMOSTASIS (ARGON PLASMA COAGULATION/BICAP);  Surgeon: Cleotis Nipper, MD;  Location: [facility];  Service: Endoscopy;  Laterality: N/A;   PORTACATH PLACEMENT Right 01/04/2022   Procedure: PORT PLACEMENT WITH ULTRASOUND GUIDANCE;  Surgeon: Erroll Luna, MD;  Location: [place];  Service: General;  Laterality: Right;   SIMPLE MASTECTOMY WITH AXILLARY SENTINEL NODE BIOPSY Right 03/27/2022   Procedure: RIGHT SIMPLE MASTECTOMY WITH RIGHT SENTINEL LYMPH NODE BIOPSY;  Surgeon: Erroll Luna, MD;  Location: [place];  Service: General;  Laterality: Right;   Patient Active Problem List   Diagnosis Date Noted   Hypercholesteremia 05/20/2022   Vitamin B12 deficiency 05/20/2022   Port-A-Cath in place 01/09/2022   Genetic testing 12/21/2021   Family history of breast cancer 12/06/2021   Malignant neoplasm of lower-inner quadrant of right breast of female,  estrogen receptor negative ([place]) 12/04/2021   Resides in long term care facility 08/18/2020   Closed subcapital fracture of neck of femur, left, initial encounter ([place]) 06/06/2020   SBO (small bowel obstruction) ([place]) 04/08/2020   Chronic right shoulder pain 01/31/2020   Glaucoma of both eyes 01/31/2020   Restless legs syndrome 01/25/2019   Skin lesion of left leg 01/25/2019   Bilateral hearing loss 10/02/2015   Osteopenia after menopause 06/08/2014   GERD (gastroesophageal reflux disease) 01/25/2013   Chronic low back pain 01/24/2013   Colon polyps 01/24/2013    REFERRING DIAG: right breast cancer at risk for lymphedema  THERAPY DIAG:  Aftercare following surgery for neoplasm  PERTINENT HISTORY: Patient was diagnosed on 11/19/2021 with right 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 It measures 2.6 cm and is located in the upper inner quadrant. It is ER/PR negative and HER2 positive with a Ki67 of 98%. She had a left hip fracture in 05/2020 which required surgery and had a right foot bunion surgery in 08/2021.   PRECAUTIONS: right UE Lymphedema risk  SUBJECTIVE: Here or SOZO screen  PAIN:  Are you having pain? No  SOZO SCREENING: Patient was assessed today using the SOZO machine to determine the lymphedema index score. This was compared to her baseline score. It was determined that she is within the recommended range when compared to her baseline and no further action is needed at this time. She  will continue SOZO screenings. These are done every 3 months for 2 years post operatively followed by every 6 months for 2 years, and then annually.   L-DEX FLOWSHEETS - 06/10/22 1200       L-DEX LYMPHEDEMA SCREENING   Measurement Type Unilateral    L-DEX MEASUREMENT EXTREMITY Upper Extremity    POSITION  Standing    DOMINANT SIDE Right    At Risk Side Right    BASELINE SCORE (UNILATERAL) -0.1    L-DEX SCORE (UNILATERAL) 2.8    VALUE CHANGE (UNILAT) 2.9              Annia Friendly, [place] 06/10/22 12:07 PM

## 2022-06-11 ENCOUNTER — Inpatient Hospital Stay: Payer: Medicare Other

## 2022-06-11 ENCOUNTER — Inpatient Hospital Stay: Payer: Medicare Other | Attending: Hematology and Oncology

## 2022-06-11 ENCOUNTER — Other Ambulatory Visit: Payer: Self-pay

## 2022-06-11 ENCOUNTER — Ambulatory Visit
Admission: RE | Admit: 2022-06-11 | Discharge: 2022-06-11 | Disposition: A | Discharge status: Home / Self Care | Payer: Medicare Other | Source: Ambulatory Visit | Attending: Radiation Oncology | Admitting: Radiation Oncology

## 2022-06-11 ENCOUNTER — Ambulatory Visit: Payer: Medicare Other

## 2022-06-11 ENCOUNTER — Inpatient Hospital Stay (HOSPITAL_BASED_OUTPATIENT_CLINIC_OR_DEPARTMENT_OTHER): Payer: Medicare Other | Admitting: Adult Health

## 2022-06-11 ENCOUNTER — Encounter: Payer: Self-pay | Admitting: Adult Health

## 2022-06-11 VITALS — BP 128/62 | HR 69

## 2022-06-11 DIAGNOSIS — Z171 Estrogen receptor negative status [ER-]: Secondary | ICD-10-CM | POA: Diagnosis not present

## 2022-06-11 DIAGNOSIS — C50311 Malignant neoplasm of lower-inner quadrant of right female breast: Secondary | ICD-10-CM | POA: Diagnosis not present

## 2022-06-11 DIAGNOSIS — Z95828 Presence of other vascular implants and grafts: Secondary | ICD-10-CM

## 2022-06-11 DIAGNOSIS — Z5112 Encounter for antineoplastic immunotherapy: Secondary | ICD-10-CM | POA: Insufficient documentation

## 2022-06-11 DIAGNOSIS — Z510 Encounter for antineoplastic radiation therapy: Secondary | ICD-10-CM | POA: Diagnosis not present

## 2022-06-11 LAB — CMP (CANCER CENTER ONLY)
ALT: 18 U/L (ref 0–44)
AST: 27 U/L (ref 15–41)
Albumin: 3.8 g/dL (ref 3.5–5.0)
Alkaline Phosphatase: 65 U/L (ref 38–126)
Anion gap: 5 (ref 5–15)
BUN: 18 mg/dL (ref 8–23)
CO2: 29 mmol/L (ref 22–32)
Calcium: 9.6 mg/dL (ref 8.9–10.3)
Chloride: 106 mmol/L (ref 98–111)
Creatinine: 0.69 mg/dL (ref 0.44–1.00)
GFR, Estimated: >60 mL/min (ref >60)
Glucose, Bld: 101 mg/dL — ABNORMAL HIGH (ref 70–99)
Potassium: 4 mmol/L (ref 3.5–5.1)
Sodium: 140 mmol/L (ref 135–145)
Total Bilirubin: 0.9 mg/dL (ref 0.3–1.2)
Total Protein: 6.6 g/dL (ref 6.5–8.1)

## 2022-06-11 LAB — CBC WITH DIFFERENTIAL (CANCER CENTER ONLY)
Abs Immature Granulocytes: 0.01 10*3/uL (ref 0.00–0.07)
Basophils Absolute: 0 10*3/uL (ref 0.0–0.1)
Basophils Relative: 1 %
Eosinophils Absolute: 0.1 10*3/uL (ref 0.0–0.5)
Eosinophils Relative: 1 %
HCT: 33.7 % — ABNORMAL LOW (ref 36.0–46.0)
Hemoglobin: 11.1 g/dL — ABNORMAL LOW (ref 12.0–15.0)
Immature Granulocytes: 0 %
Lymphocytes Relative: 26 %
Lymphs Abs: 1.1 10*3/uL (ref 0.7–4.0)
MCH: 30.2 pg (ref 26.0–34.0)
MCHC: 32.9 g/dL (ref 30.0–36.0)
MCV: 91.8 fL (ref 80.0–100.0)
Monocytes Absolute: 0.4 10*3/uL (ref 0.1–1.0)
Monocytes Relative: 9 %
Neutro Abs: 2.6 10*3/uL (ref 1.7–7.7)
Neutrophils Relative %: 63 %
Platelet Count: 197 10*3/uL (ref 150–400)
RBC: 3.67 MIL/uL — ABNORMAL LOW (ref 3.87–5.11)
RDW: 13.2 % (ref 11.5–15.5)
WBC Count: 4.2 10*3/uL (ref 4.0–10.5)
nRBC: 0 % (ref 0.0–0.2)

## 2022-06-11 LAB — RAD ONC ARIA SESSION SUMMARY
Course Elapsed Days: 36
Plan Fractions Treated to Date: 12
Plan Prescribed Dose Per Fraction: 2 Gy
Plan Total Fractions Prescribed: 12
Plan Total Prescribed Dose: 24 Gy
Reference Point Dosage Given to Date: 48 Gy
Reference Point Session Dosage Given: 2 Gy
Session Number: 24

## 2022-06-11 MED ORDER — ACETAMINOPHEN 325 MG PO TABS
650.0000 mg | ORAL_TABLET | Freq: Once | ORAL | Status: AC
  Administered 2022-06-11: 650 mg via ORAL
  Filled 2022-06-11: qty 2

## 2022-06-11 MED ORDER — SODIUM CHLORIDE 0.9 % IV SOLN: Freq: Once | INTRAVENOUS | Status: AC

## 2022-06-11 MED ORDER — PROCHLORPERAZINE MALEATE 10 MG PO TABS
10.0000 mg | ORAL_TABLET | Freq: Once | ORAL | Status: AC
  Administered 2022-06-11: 10 mg via ORAL
  Filled 2022-06-11: qty 1

## 2022-06-11 MED ORDER — SONAFINE EX EMUL
1.0000 | Freq: Two times a day (BID) | CUTANEOUS | Status: DC
  Administered 2022-06-11: 1 via TOPICAL

## 2022-06-11 MED ORDER — SODIUM CHLORIDE 0.9% FLUSH
10.0000 mL | INTRAVENOUS | Status: DC | PRN
  Administered 2022-06-11: 10 mL

## 2022-06-11 MED ORDER — DIPHENHYDRAMINE HCL 25 MG PO CAPS
25.0000 mg | ORAL_CAPSULE | Freq: Once | ORAL | Status: AC
  Administered 2022-06-11: 25 mg via ORAL
  Filled 2022-06-11: qty 1

## 2022-06-11 MED ORDER — HEPARIN SOD (PORK) LOCK FLUSH 100 UNIT/ML IV SOLN
500.0000 [IU] | Freq: Once | INTRAVENOUS | Status: AC | PRN
  Administered 2022-06-11: 500 [IU]

## 2022-06-11 MED ORDER — SODIUM CHLORIDE 0.9% FLUSH
10.0000 mL | Freq: Once | INTRAVENOUS | Status: AC
  Administered 2022-06-11: 10 mL

## 2022-06-11 MED ORDER — SODIUM CHLORIDE 0.9 % IV SOLN
3.6000 mg/kg | Freq: Once | INTRAVENOUS | Status: AC
  Administered 2022-06-11: 220 mg via INTRAVENOUS
  Filled 2022-06-11: qty 8

## 2022-06-11 NOTE — Progress Notes (Signed)
Per Mendel Ryder NP, ok to treat with ECHO from 04/25/22, EF 50-55%

## 2022-06-11 NOTE — Progress Notes (Signed)
[facility] Follow up:    Sydney Bott, MD [address] [place] [address]   DIAGNOSIS:  Cancer Staging  Malignant neoplasm of lower-inner quadrant of right breast of female, estrogen receptor negative ([place]) Staging form: Breast, AJCC 8th Edition - Clinical: Stage IIA (cT2, cN0, cM0, G3, ER-, PR-, HER2+) - Signed by Nicholas Lose, MD on 12/05/2021 Stage prefix: Initial diagnosis Histologic grading system: 3 grade system   SUMMARY OF ONCOLOGIC HISTORY: Oncology History  Malignant neoplasm of lower-inner quadrant of right breast of female, estrogen receptor negative ([place])  11/28/2021 Initial Diagnosis   Palpable right breast lump 2.6 cm with calcifications at 3 o'clock position, additional 1.3 cm lesion is an intramammary lymph node biopsy: Benign, axilla negative, biopsy of the breast lump: Grade 3 IDC ER 0%, PR 0%, Ki-67 98%, HER2 3+ positive   12/05/2021 Cancer Staging   Staging form: Breast, AJCC 8th Edition - Clinical: Stage IIA (cT2, cN0, cM0, G3, ER-, PR-, HER2+) - Signed by Nicholas Lose, MD on 12/05/2021 Stage prefix: Initial diagnosis Histologic grading system: 3 grade system   12/26/2021 - 03/12/2022 Chemotherapy   Patient is on Treatment Plan : BREAST Paclitaxel + Trastuzumab q7d / Trastuzumab q21d      Genetic Testing   [facility] CustomNext Panel was Negative. Report date is 12/18/2021.  The CustomNext-Cancer+RNAinsight panel offered by [facility] includes sequencing and rearrangement analysis for the following 47 genes:  APC, ATM, AXIN2, BARD1, BMPR1A, BRCA1, BRCA2, BRIP1, CDH1, CDK4, CDKN2A, CHEK2, CTNNA1, DICER1, EPCAM, GREM1, HOXB13, KIT, MEN1, MLH1, MSH2, MSH3, MSH6, MUTYH, NBN, NF1, NTHL1, PALB2, PDGFRA, PMS2, POLD1, POLE, PTEN, RAD50, RAD51C, RAD51D, SDHA, SDHB, SDHC, SDHD, SMAD4, SMARCA4, STK11, TP53, TSC1, TSC2, and VHL.  RNA data is routinely analyzed for use in variant interpretation for all genes.   03/27/2022 Surgery    Right breast mastectomy: Invasive ductal carcinoma Nottingham grade 3 with no morphologic evidence of neoadjuvant chemotherapy effect negative for lymphovascular and perineural invasion the size noted was 4.2 x 3.7 x 3.5 cm margins were negative and 2 lymph nodes biopsied were negative. Cancer staging: ypT2 pN0 pM0   04/08/2022 -  Chemotherapy   Patient is on Treatment Plan : BREAST ADO-Trastuzumab Emtansine (Kadcyla) q21d     05/06/2022 - 06/13/2022 Radiation Therapy   Adjuvant radiation therapy     CURRENT THERAPY: Kadcyla/Radiation  INTERVAL HISTORY: Sydney Key 88 y.o. female returns for f/u prior to receiving Kadcyla therapy.  She is tolerating this well and denies any side effects.  She is also undergoing radiation and finishes on Thursday.  Her skin is red from the radiation, she denies any peeling of the skin.    Sydney Key saw Dr. Harl Bowie on 05/22/2022 and began on Coreg BID.  She denies any light headedness since starting this.  She started the Coreg to improve the cardiac strain with preserved EF and will see Dr. Harl Bowie in January for f/u.     Patient Active Problem List   Diagnosis Date Noted   Hypercholesteremia 05/20/2022   Vitamin B12 deficiency 05/20/2022   Port-A-Cath in place 01/09/2022   Genetic testing 12/21/2021   Family history of breast cancer 12/06/2021   Malignant neoplasm of lower-inner quadrant of right breast of female, estrogen receptor negative ([place]) 12/04/2021   Resides in long term care facility 08/18/2020   Closed subcapital fracture of neck of femur, left, initial encounter ([place]) 06/06/2020   SBO (small bowel obstruction) ([place]) 04/08/2020   Chronic right  shoulder pain 01/31/2020   Glaucoma of both eyes 01/31/2020   Restless legs syndrome 01/25/2019   Skin lesion of left leg 01/25/2019   Bilateral hearing loss 10/02/2015   Osteopenia after menopause 06/08/2014   GERD (gastroesophageal reflux disease) 01/25/2013   Chronic low back pain 01/24/2013   Colon  polyps 01/24/2013    is allergic to paclitaxel.  MEDICAL HISTORY: Past Medical History:  Diagnosis Date   Anxiety    Cancer [facility]) 2023   Right breast cancer   History of kidney stones     SURGICAL HISTORY: Past Surgical History:  Procedure Laterality Date   ABDOMINAL HYSTERECTOMY  age 89   COLONOSCOPY N/A 05/14/2013   Procedure: COLONOSCOPY;  Surgeon: Cleotis Nipper, MD;  [facility];  Service: Endoscopy;  Laterality: N/A;   EYE SURGERY Left 07/09/2011   retina detachment   FOOT SURGERY Right    "shaved down" bone on Great toe   HOT HEMOSTASIS N/A 05/14/2013   Procedure: HOT HEMOSTASIS (ARGON PLASMA COAGULATION/BICAP);  Surgeon: Cleotis Nipper, MD;  Location[facility];  Service: Endoscopy;  Laterality: N/A;   PORTACATH PLACEMENT Right 01/04/2022   Procedure: PORT PLACEMENT WITH ULTRASOUND GUIDANCE;  Surgeon: Erroll Luna, MD;  Location: [place];  Service: General;  Laterality: Right;   SIMPLE MASTECTOMY WITH AXILLARY SENTINEL NODE BIOPSY Right 03/27/2022   Procedure: RIGHT SIMPLE MASTECTOMY WITH RIGHT SENTINEL LYMPH NODE BIOPSY;  Surgeon: Erroll Luna, MD;  Location: [place];  Service: General;  Laterality: Right;    SOCIAL HISTORY: Social History   Socioeconomic History   Marital status: Widowed    Spouse name: Not on file   Number of children: 3   Years of education: Not on file   Highest education level: Not on file  Occupational History   Not on file  Tobacco Use   Smoking status: Never   Smokeless tobacco: Never  Vaping Use   Vaping Use: Never used  Substance and Sexual Activity   Alcohol use: No   Drug use: No   Sexual activity: Not on file  Other Topics Concern   Not on file  Social History Narrative   Not on file   Social Determinants of Health   Financial Resource Strain: Not on file  Food Insecurity: No Food Insecurity (03/27/2022)   Hunger Vital Sign    Worried About Running Out of Food in the Last Year:  Never true    Ran Out of Food in the Last Year: Never true  Transportation Needs: No Transportation Needs (03/27/2022)   PRAPARE - Hydrologist (Medical): No    Lack of Transportation (Non-Medical): No  Physical Activity: Not on file  Stress: Not on file  Social Connections: Not on file  Intimate Partner Violence: Not At Risk (03/27/2022)   Humiliation, Afraid, Rape, and Kick questionnaire    Fear of Current or Ex-Partner: No    Emotionally Abused: No    Physically Abused: No    Sexually Abused: No    FAMILY HISTORY: Family History  Problem Relation Age of Onset   Cancer Mother 100       unknown type- the cancer was on her neck   Colon cancer Father 13   Breast cancer Sister 60   Breast cancer Niece 20   Breast cancer Niece 66   Breast cancer Daughter 45       negative genetic testing    Review of Systems  Constitutional:  Positive  for fatigue. Negative for appetite change, chills, fever and unexpected weight change.  HENT:   Negative for hearing loss, lump/mass and trouble swallowing.   Eyes:  Negative for eye problems and icterus.  Respiratory:  Negative for chest tightness, cough and shortness of breath.   Cardiovascular:  Negative for chest pain, leg swelling and palpitations.  Gastrointestinal:  Negative for abdominal distention, abdominal pain, constipation, diarrhea, nausea and vomiting.  Endocrine: Negative for hot flashes.  Genitourinary:  Negative for difficulty urinating.   Musculoskeletal:  Negative for arthralgias.  Skin:  Negative for itching and rash.  Neurological:  Negative for dizziness, extremity weakness, headaches and numbness.  Hematological:  Negative for adenopathy. Does not bruise/bleed easily.  Psychiatric/Behavioral:  Negative for depression. The patient is not nervous/anxious.       PHYSICAL EXAMINATION  ECOG PERFORMANCE STATUS: 1 - Symptomatic but completely ambulatory  Vitals:   06/11/22 1211  BP: (!) 144/70   Pulse: 70  Resp: 16  Temp: 97.8 F (36.6 C)  SpO2: 100%    Physical Exam Constitutional:      General: She is not in acute distress.    Appearance: Normal appearance. She is not toxic-appearing.  HENT:     Head: Normocephalic and atraumatic.  Eyes:     General: No scleral icterus. Cardiovascular:     Rate and Rhythm: Normal rate and regular rhythm.     Pulses: Normal pulses.     Heart sounds: Normal heart sounds.  Pulmonary:     Effort: Pulmonary effort is normal.     Breath sounds: Normal breath sounds.  Abdominal:     General: Abdomen is flat. Bowel sounds are normal. There is no distension.     Palpations: Abdomen is soft.     Tenderness: There is no abdominal tenderness.  Musculoskeletal:        General: No swelling.     Cervical back: Neck supple.  Lymphadenopathy:     Cervical: No cervical adenopathy.  Skin:    General: Skin is warm and dry.     Findings: No rash.  Neurological:     General: No focal deficit present.     Mental Status: She is alert.  Psychiatric:        Mood and Affect: Mood normal.        Behavior: Behavior normal.     LABORATORY DATA:  CBC    Component Value Date/Time   WBC 4.2 06/11/2022 1016   WBC 4.0 03/22/2022 1430   RBC 3.67 (L) 06/11/2022 1016   HGB 11.1 (L) 06/11/2022 1016   HCT 33.7 (L) 06/11/2022 1016   PLT 197 06/11/2022 1016   MCV 91.8 06/11/2022 1016   MCH 30.2 06/11/2022 1016   MCHC 32.9 06/11/2022 1016   RDW 13.2 06/11/2022 1016   LYMPHSABS 1.1 06/11/2022 1016   MONOABS 0.4 06/11/2022 1016   EOSABS 0.1 06/11/2022 1016   BASOSABS 0.0 06/11/2022 1016    CMP     Component Value Date/Time   NA 140 06/11/2022 1016   K 4.0 06/11/2022 1016   CL 106 06/11/2022 1016   CO2 29 06/11/2022 1016   GLUCOSE 101 (H) 06/11/2022 1016   BUN 18 06/11/2022 1016   CREATININE 0.69 06/11/2022 1016   CALCIUM 9.6 06/11/2022 1016   PROT 6.6 06/11/2022 1016   ALBUMIN 3.8 06/11/2022 1016   AST 27 06/11/2022 1016   ALT 18  06/11/2022 1016   ALKPHOS 65 06/11/2022 1016   BILITOT 0.9 06/11/2022 1016  GFRNONAA >60 06/11/2022 1016       ASSESSMENT and THERAPY PLAN:   Malignant neoplasm of lower-inner quadrant of right breast of female, estrogen receptor negative (HCC) Sydney Key is an 88 year old woman with history of stage IIa HER2 positive breast cancer diagnosed in May 2023 status post neoadjuvant chemotherapy with Taxol/Herceptin followed by mastectomy, now on treatment with Kadcyla, and adjuvant radiation.  Sydney Key is doing well today and has no signs of breast cancer recurrence.  She will continue with her adjuvant radiation as she finishes this on Thursday.  She is tolerating the Kadcyla well and will continue this every 3 weeks her labs are stable.  Her most recent echo on October 19 did demonstrate a slight decrease in the ejection fraction to 50 to 55%.  She was seen by Dr. Harl Bowie and will continue to follow her.  Per Dr. Nelly Laurence last note her ejection fraction is preserved and stable to receive treatment.  She is tolerating the Coreg twice daily well and will continue this.  Her fatigue is likely related to the Kadcyla and radiation.  This should improve as she finishes her radiation and regains her strength.  Sydney Key will continue with Kadcyla every 3 weeks and she will see Dr. Lindi Adie or myself with every other treatment.  All questions were answered. The patient knows to call the clinic with any problems, questions or concerns. We can certainly see the patient much sooner if necessary.  Total encounter time:20 minutes*in face-to-face visit time, chart review, lab review, care coordination, order entry, and documentation of the encounter time.    Wilber Bihari, NP 06/11/22 12:45 PM Medical Oncology and Hematology [facility] [place], [place] [address] Tel. [phone]    Fax. [phone]  *Total Encounter Time as defined by the Centers for Medicare and Medicaid Services  includes, in addition to the face-to-face time of a patient visit (documented in the note above) non-face-to-face time: obtaining and reviewing outside history, ordering and reviewing medications, tests or procedures, care coordination (communications with other health care professionals or caregivers) and documentation in the medical record.

## 2022-06-11 NOTE — Patient Instructions (Signed)
[facility] MEDICAL ONCOLOGY  Discharge Instructions: Thank you for choosing [facility] to provide your oncology and hematology care.   If you have a lab appointment with the Cancer Center, please go directly to the Cancer Center and check in at the registration area.   Wear comfortable clothing and clothing appropriate for easy access to any Portacath or PICC line.   We strive to give you quality time with your provider. You may need to reschedule your appointment if you arrive late (15 or more minutes).  Arriving late affects you and other patients whose appointments are after yours.  Also, if you miss three or more appointments without notifying the office, you may be dismissed from the clinic at the provider's discretion.      For prescription refill requests, have your pharmacy contact our office and allow 72 hours for refills to be completed.    Today you received the following chemotherapy and/or immunotherapy agents : Kadcyla      To help prevent nausea and vomiting after your treatment, we encourage you to take your nausea medication as directed.  BELOW ARE SYMPTOMS THAT SHOULD BE REPORTED IMMEDIATELY: *FEVER GREATER THAN 100.4 F (38 C) OR HIGHER *CHILLS OR SWEATING *NAUSEA AND VOMITING THAT IS NOT CONTROLLED WITH YOUR NAUSEA MEDICATION *UNUSUAL SHORTNESS OF BREATH *UNUSUAL BRUISING OR BLEEDING *URINARY PROBLEMS (pain or burning when urinating, or frequent urination) *BOWEL PROBLEMS (unusual diarrhea, constipation, pain near the anus) TENDERNESS IN MOUTH AND THROAT WITH OR WITHOUT PRESENCE OF ULCERS (sore throat, sores in mouth, or a toothache) UNUSUAL RASH, SWELLING OR PAIN  UNUSUAL VAGINAL DISCHARGE OR ITCHING   Items with * indicate a potential emergency and should be followed up as soon as possible or go to the Emergency Department if any problems should occur.  Please show the CHEMOTHERAPY ALERT CARD or IMMUNOTHERAPY ALERT CARD at check-in to  the Emergency Department and triage nurse.  Should you have questions after your visit or need to cancel or reschedule your appointment, please contact [facility] MEDICAL ONCOLOGY  Dept: [phone]  and follow the prompts.  Office hours are 8:00 a.m. to 4:30 p.m. Monday - Friday. Please note that voicemails left after 4:00 p.m. may not be returned until the following business day.  We are closed weekends and major holidays. You have access to a nurse at all times for urgent questions. Please call the main number to the clinic Dept: [phone] and follow the prompts.   For any non-urgent questions, you may also contact your provider using MyChart. We now offer e-Visits for anyone 18 and older to request care online for non-urgent symptoms. For details visit [email or web address].   Also download the MyChart app! Go to the app store, search "MyChart", open the app, select [facility], and log in with your MyChart username and password.  Masks are optional in the cancer centers. If you would like for your care team to wear a mask while they are taking care of you, please let them know. You may have one support person who is at least 86 years old accompany you for your appointments. 

## 2022-06-11 NOTE — Assessment & Plan Note (Signed)
Sydney Key is an 88 year old woman with history of stage IIa HER2 positive breast cancer diagnosed in May 2023 status post neoadjuvant chemotherapy with Taxol/Herceptin followed by mastectomy, now on treatment with Kadcyla, and adjuvant radiation.  Sydney Key is doing well today and has no signs of breast cancer recurrence.  Sydney Key will continue with her adjuvant radiation as Sydney Key finishes this on Thursday.  Sydney Key is tolerating the Kadcyla well and will continue this every 3 weeks her labs are stable.  Her most recent echo on October 19 did demonstrate a slight decrease in the ejection fraction to 50 to 55%.  Sydney Key was seen by Dr. Harl Bowie and will continue to follow her.  Per Dr. Nelly Laurence last note her ejection fraction is preserved and stable to receive treatment.  Sydney Key is tolerating the Coreg twice daily well and will continue this.  Her fatigue is likely related to the Kadcyla and radiation.  This should improve as Sydney Key finishes her radiation and regains her strength.  Sydney Key will continue with Kadcyla every 3 weeks and Sydney Key will see Dr. Lindi Adie or myself with every other treatment.

## 2022-06-12 ENCOUNTER — Ambulatory Visit: Payer: Medicare Other

## 2022-06-12 DIAGNOSIS — H401123 Primary open-angle glaucoma, left eye, severe stage: Secondary | ICD-10-CM | POA: Diagnosis not present

## 2022-06-13 ENCOUNTER — Ambulatory Visit
Admission: RE | Admit: 2022-06-13 | Discharge: 2022-06-13 | Disposition: A | Discharge status: Home / Self Care | Payer: Medicare Other | Source: Ambulatory Visit | Attending: Radiation Oncology | Admitting: Radiation Oncology

## 2022-06-13 ENCOUNTER — Other Ambulatory Visit: Payer: Self-pay

## 2022-06-13 DIAGNOSIS — Z171 Estrogen receptor negative status [ER-]: Secondary | ICD-10-CM | POA: Diagnosis not present

## 2022-06-13 DIAGNOSIS — Z510 Encounter for antineoplastic radiation therapy: Secondary | ICD-10-CM | POA: Diagnosis not present

## 2022-06-13 DIAGNOSIS — C50311 Malignant neoplasm of lower-inner quadrant of right female breast: Secondary | ICD-10-CM | POA: Diagnosis not present

## 2022-06-13 LAB — RAD ONC ARIA SESSION SUMMARY
Course Elapsed Days: 38
Plan Fractions Treated to Date: 13
Plan Prescribed Dose Per Fraction: 2 Gy
Plan Total Fractions Prescribed: 13
Plan Total Prescribed Dose: 26 Gy
Reference Point Dosage Given to Date: 50 Gy
Reference Point Session Dosage Given: 2 Gy
Session Number: 25

## 2022-06-27 DIAGNOSIS — Z96642 Presence of left artificial hip joint: Secondary | ICD-10-CM | POA: Diagnosis not present

## 2022-06-27 DIAGNOSIS — S72012D Unspecified intracapsular fracture of left femur, subsequent encounter for closed fracture with routine healing: Secondary | ICD-10-CM | POA: Diagnosis not present

## 2022-06-27 DIAGNOSIS — M76892 Other specified enthesopathies of left lower limb, excluding foot: Secondary | ICD-10-CM | POA: Diagnosis not present

## 2022-06-27 DIAGNOSIS — M25552 Pain in left hip: Secondary | ICD-10-CM | POA: Diagnosis not present

## 2022-07-02 ENCOUNTER — Ambulatory Visit: Payer: Medicare Other | Admitting: Hematology and Oncology

## 2022-07-02 ENCOUNTER — Other Ambulatory Visit: Payer: Self-pay

## 2022-07-02 ENCOUNTER — Inpatient Hospital Stay: Payer: Medicare Other

## 2022-07-02 ENCOUNTER — Ambulatory Visit: Payer: Medicare Other

## 2022-07-02 ENCOUNTER — Other Ambulatory Visit: Payer: Medicare Other

## 2022-07-02 VITALS — BP 116/58 | HR 65 | Temp 98.3°F | Resp 16 | Wt 133.5 lb

## 2022-07-02 DIAGNOSIS — Z5112 Encounter for antineoplastic immunotherapy: Secondary | ICD-10-CM | POA: Diagnosis not present

## 2022-07-02 DIAGNOSIS — C50311 Malignant neoplasm of lower-inner quadrant of right female breast: Secondary | ICD-10-CM | POA: Diagnosis not present

## 2022-07-02 DIAGNOSIS — Z171 Estrogen receptor negative status [ER-]: Secondary | ICD-10-CM

## 2022-07-02 DIAGNOSIS — Z95828 Presence of other vascular implants and grafts: Secondary | ICD-10-CM

## 2022-07-02 LAB — CBC WITH DIFFERENTIAL (CANCER CENTER ONLY)
Abs Immature Granulocytes: 0.01 10*3/uL (ref 0.00–0.07)
Basophils Absolute: 0 10*3/uL (ref 0.0–0.1)
Basophils Relative: 1 %
Eosinophils Absolute: 0.1 10*3/uL (ref 0.0–0.5)
Eosinophils Relative: 3 %
HCT: 32.7 % — ABNORMAL LOW (ref 36.0–46.0)
Hemoglobin: 10.7 g/dL — ABNORMAL LOW (ref 12.0–15.0)
Immature Granulocytes: 0 %
Lymphocytes Relative: 29 %
Lymphs Abs: 1.1 10*3/uL (ref 0.7–4.0)
MCH: 29.8 pg (ref 26.0–34.0)
MCHC: 32.7 g/dL (ref 30.0–36.0)
MCV: 91.1 fL (ref 80.0–100.0)
Monocytes Absolute: 0.4 10*3/uL (ref 0.1–1.0)
Monocytes Relative: 11 %
Neutro Abs: 2.1 10*3/uL (ref 1.7–7.7)
Neutrophils Relative %: 56 %
Platelet Count: 168 10*3/uL (ref 150–400)
RBC: 3.59 MIL/uL — ABNORMAL LOW (ref 3.87–5.11)
RDW: 13.8 % (ref 11.5–15.5)
WBC Count: 3.8 10*3/uL — ABNORMAL LOW (ref 4.0–10.5)
nRBC: 0 % (ref 0.0–0.2)

## 2022-07-02 LAB — CMP (CANCER CENTER ONLY)
ALT: 19 U/L (ref 0–44)
AST: 27 U/L (ref 15–41)
Albumin: 3.8 g/dL (ref 3.5–5.0)
Alkaline Phosphatase: 60 U/L (ref 38–126)
Anion gap: 6 (ref 5–15)
BUN: 17 mg/dL (ref 8–23)
CO2: 28 mmol/L (ref 22–32)
Calcium: 9.4 mg/dL (ref 8.9–10.3)
Chloride: 105 mmol/L (ref 98–111)
Creatinine: 0.7 mg/dL (ref 0.44–1.00)
GFR, Estimated: >60 mL/min (ref >60)
Glucose, Bld: 101 mg/dL — ABNORMAL HIGH (ref 70–99)
Potassium: 4.2 mmol/L (ref 3.5–5.1)
Sodium: 139 mmol/L (ref 135–145)
Total Bilirubin: 0.8 mg/dL (ref 0.3–1.2)
Total Protein: 6.6 g/dL (ref 6.5–8.1)

## 2022-07-02 MED ORDER — SODIUM CHLORIDE 0.9 % IV SOLN: Freq: Once | INTRAVENOUS | Status: AC

## 2022-07-02 MED ORDER — ACETAMINOPHEN 325 MG PO TABS
650.0000 mg | ORAL_TABLET | Freq: Once | ORAL | Status: AC
  Administered 2022-07-02: 650 mg via ORAL
  Filled 2022-07-02: qty 2

## 2022-07-02 MED ORDER — SODIUM CHLORIDE 0.9 % IV SOLN
3.6000 mg/kg | Freq: Once | INTRAVENOUS | Status: AC
  Administered 2022-07-02: 220 mg via INTRAVENOUS
  Filled 2022-07-02: qty 8

## 2022-07-02 MED ORDER — DIPHENHYDRAMINE HCL 25 MG PO CAPS
25.0000 mg | ORAL_CAPSULE | Freq: Once | ORAL | Status: AC
  Administered 2022-07-02: 25 mg via ORAL
  Filled 2022-07-02: qty 1

## 2022-07-02 MED ORDER — SODIUM CHLORIDE 0.9% FLUSH
10.0000 mL | INTRAVENOUS | Status: DC | PRN
  Administered 2022-07-02: 10 mL

## 2022-07-02 MED ORDER — HEPARIN SOD (PORK) LOCK FLUSH 100 UNIT/ML IV SOLN
500.0000 [IU] | Freq: Once | INTRAVENOUS | Status: AC | PRN
  Administered 2022-07-02: 500 [IU]

## 2022-07-02 MED ORDER — PROCHLORPERAZINE MALEATE 10 MG PO TABS
10.0000 mg | ORAL_TABLET | Freq: Once | ORAL | Status: AC
  Administered 2022-07-02: 10 mg via ORAL
  Filled 2022-07-02: qty 1

## 2022-07-02 MED ORDER — SODIUM CHLORIDE 0.9% FLUSH
10.0000 mL | Freq: Once | INTRAVENOUS | Status: AC
  Administered 2022-07-02: 10 mL

## 2022-07-02 NOTE — Patient Instructions (Signed)
[facility] MEDICAL ONCOLOGY  Discharge Instructions: Thank you for choosing [facility] to provide your oncology and hematology care.   If you have a lab appointment with the Cancer Center, please go directly to the Cancer Center and check in at the registration area.   Wear comfortable clothing and clothing appropriate for easy access to any Portacath or PICC line.   We strive to give you quality time with your provider. You may need to reschedule your appointment if you arrive late (15 or more minutes).  Arriving late affects you and other patients whose appointments are after yours.  Also, if you miss three or more appointments without notifying the office, you may be dismissed from the clinic at the provider's discretion.      For prescription refill requests, have your pharmacy contact our office and allow 72 hours for refills to be completed.    Today you received the following chemotherapy and/or immunotherapy agents : Kadcyla      To help prevent nausea and vomiting after your treatment, we encourage you to take your nausea medication as directed.  BELOW ARE SYMPTOMS THAT SHOULD BE REPORTED IMMEDIATELY: *FEVER GREATER THAN 100.4 F (38 C) OR HIGHER *CHILLS OR SWEATING *NAUSEA AND VOMITING THAT IS NOT CONTROLLED WITH YOUR NAUSEA MEDICATION *UNUSUAL SHORTNESS OF BREATH *UNUSUAL BRUISING OR BLEEDING *URINARY PROBLEMS (pain or burning when urinating, or frequent urination) *BOWEL PROBLEMS (unusual diarrhea, constipation, pain near the anus) TENDERNESS IN MOUTH AND THROAT WITH OR WITHOUT PRESENCE OF ULCERS (sore throat, sores in mouth, or a toothache) UNUSUAL RASH, SWELLING OR PAIN  UNUSUAL VAGINAL DISCHARGE OR ITCHING   Items with * indicate a potential emergency and should be followed up as soon as possible or go to the Emergency Department if any problems should occur.  Please show the CHEMOTHERAPY ALERT CARD or IMMUNOTHERAPY ALERT CARD at check-in to  the Emergency Department and triage nurse.  Should you have questions after your visit or need to cancel or reschedule your appointment, please contact [facility]ICAL ONCOLOG[facility]  Dept: [phone]  and follow the prompts.  Office hours are 8:00 a.m. to 4:30 p.m. Monday - Friday. Please note that voicemails left after 4:00 p.m. may not be returned until the following business day.  We are closed weekends and major holidays. You have access to a nurse at all times for urgent questions. Please call the main number to the clinic Dept: [phone] and follow the prompts.   For any non-urgent questions, you may also contact your provider using MyChart. We now offer e-Visits for anyone 18 and older to request care online for non-urgent symptoms. For details visit [email or web address].   Also download the MyChart app! Go to the app store, search "MyChart", open the app, select , and log in with your MyChart username and password.  Masks are optional in the cancer centers. If you would like for your care team to wear a mask while they are taking care of you, please let them know. You may have one support person who is at least 86 years old accompany you for your appointments. 

## 2022-07-02 NOTE — Progress Notes (Signed)
Pt declined to be observed for 30 minutes post Kadcyla infusion. Pt tolerated trtmt well w/out incident. VSS at discharge.  Ambulatory to lobby.

## 2022-07-10 ENCOUNTER — Encounter: Payer: Self-pay | Admitting: Hematology and Oncology

## 2022-07-10 DIAGNOSIS — E538 Deficiency of other specified B group vitamins: Secondary | ICD-10-CM | POA: Diagnosis not present

## 2022-07-12 ENCOUNTER — Telehealth: Payer: Self-pay

## 2022-07-12 ENCOUNTER — Telehealth: Payer: Self-pay | Admitting: *Deleted

## 2022-07-12 NOTE — Telephone Encounter (Signed)
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 She stated she was doing well and her skin was "barely pink now". She did not have any needs with this call. Rn will call her to let her know that she needs to get her scheduled mammograms and use Vitamin E cream for two months.

## 2022-07-12 NOTE — Radiation Completion Notes (Signed)
Patient Name: Sydney Key, Sydney Key MRN: [id number] Date of Birth: May 09, 1931 Referring Physician: Raelene Bott, M.D. Date of Service: 2022-07-12 Radiation Oncologist: Eppie Gibson, M.D. [place]                             Radiation Oncology End of Treatment Note     Diagnosis: C50.311 Malignant neoplasm of lower-inner quadrant of right female breast Staging on 2021-12-05: Malignant neoplasm of lower-inner quadrant of right breast of female, estrogen receptor negative ([place]) T=cT2, N=cN0, M=cM0 Intent: Curative     ==========DELIVERED PLANS==========  First Treatment Date: 2022-05-06 - Last Treatment Date: 2022-06-13   Plan Name: CW_R_BO Site: Chest Wall, Right Technique: 3D Mode: Photon Dose Per Fraction: 2 Gy Prescribed Dose (Delivered / Prescribed): 26 Gy / 26 Gy Prescribed Fxs (Delivered / Prescribed): 13 / 13   Plan Name: CW_R Site: Chest Wall, Right Technique: 3D Mode: Photon Dose Per Fraction: 2 Gy Prescribed Dose (Delivered / Prescribed): 24 Gy / 24 Gy Prescribed Fxs (Delivered / Prescribed): 12 / 12     ==========ON TREATMENT VISIT DATES========== 2022-05-06, 2022-05-13, 2022-05-20, 2022-05-27, 2022-06-03, 2022-06-11     ==========UPCOMING VISITS==========       ==========APPENDIX - ON TREATMENT VISIT NOTES==========   PatEd 2022-05-06 Ongoing education performed.   ImpPlan 2022-05-06 The patient is tolerating radiation. Continue treatment as planned.   PhysExam 2022-05-06 Alert, no acute distress.   ProgNote 2022-05-06 Changes from last week/visit? [ No ] Pain? [ No ] Fatigue? [ Yes, mild intermittent ] Skin irritation? [ No ] Using lotions? [ No ] Lymphedema? [ No ] Issues with ROM? [ No ] Need refills: [ no ] Additional  Weekly Progress Notes [ none at this time.  ]    RunningNotes 2022-05-06 05-06-22 Education performed    PatEd 2022-05-13 Ongoing education performed.   ImpPlan 2022-05-13 The patient is  tolerating radiation. Continue treatment as planned.   PhysExam 2022-05-13 Alert, no acute distress.   ProgNote 2022-05-13 Changes from last week/visit? [ No ] Pain? [ No ] Fatigue? [ Yes, mild fatigue ] Skin irritation? [ No ] Using lotions? [ Yes ] Lymphedema? [ No ] Issues with ROM? [ No ] Need refills: [ No ] Additional  Weekly Progress Notes [ no concerns at this time.  ]    PatEd 2022-05-20 Ongoing education performed.   ImpPlan 2022-05-20 The patient is tolerating radiation. Continue treatment as planned.   PhysExam 2022-05-20 Alert, no acute distress.   ProgNote 2022-05-20 Changes from last week/visit? [ No ] Pain? [ No ] Fatigue? [ Yes, mild intermittently ] Skin irritation? [ No ] Using lotions? [ Yes ] Lymphedema? [ No ] Issues with ROM? [ No ] Need refills: [ No ] Additional  Weekly Progress Notes [ no concerns at this point ]    PatEd 2022-05-27 Ongoing education performed.   ImpPlan 2022-05-27 The patient is tolerating radiation. Continue treatment as planned.   PhysExam 2022-05-27 Alert, no acute distress.   ProgNote 2022-05-27 Changes from last week/visit? [ No ] Pain? [ Yes, burning at radiation area ] Fatigue? [ No ] Skin irritation? [ yes, more redness present ] Using lotions? [ Yes ] Lymphedema? [ No ] Issues with ROM? [ No ] Need refills: [ No ] Additional  Weekly Progress Notes [ no major issues ]    PatEd 2022-06-03 Ongoing education performed.   ImpPlan 2022-06-03 The patient is tolerating radiation. Continue treatment  as planned.   PhysExam 2022-06-03 Alert, no acute distress.   ProgNote 2022-06-03 Changes from last week/visit? [ Yes, skin is burning more this week ] Pain? [ No ] Fatigue? [ Yes, feels like she has to rest more ] Skin irritation? [ Yes, more redness this week ] Using lotions? [ Yes ] Lymphedema? [ No ] Issues with ROM? [ No ] Need refills: [ No ] Additional  Weekly Progress Notes [ no major issues this week ]     PatEd 2022-06-10 Ongoing education performed.   PhysExam 2022-06-10 Alert, no acute distress.   ImpPlan 2022-06-10 The patient is tolerating radiation. Continue treatment as planned.   PatEd 2022-06-11 Ongoing education performed.   ImpPlan 2022-06-11 The patient is tolerating radiation. Continue treatment as planned.   PhysExam 2022-06-11 Alert, no acute distress.   ProgNote 2022-06-11 Changes from last week/visit? [ Yes, skin more irritated ] Pain? [ No ] Fatigue? [ Yes, more tired this week ] Skin irritation? [ Yes, more redness this week ] Using lotions? [ Yes ] Lymphedema? [ No ] Issues with ROM? [ No ] Need refills: [ No ] Additional  Weekly Progress Notes [  ]

## 2022-07-12 NOTE — Telephone Encounter (Signed)
Returned patient's phone call, spoke with patient 

## 2022-07-17 ENCOUNTER — Ambulatory Visit (HOSPITAL_COMMUNITY): Payer: Medicare Other | Attending: Internal Medicine

## 2022-07-17 ENCOUNTER — Encounter: Payer: Self-pay | Admitting: Hematology and Oncology

## 2022-07-17 DIAGNOSIS — I5030 Unspecified diastolic (congestive) heart failure: Secondary | ICD-10-CM

## 2022-07-17 LAB — ECHOCARDIOGRAM LIMITED
Area-P 1/2: 4.21 cm2
MV M vel: 4.37 m/s
MV Peak grad: 76.4 mmHg
P 1/2 time: 655 msec
S' Lateral: 3.2 cm

## 2022-07-18 ENCOUNTER — Ambulatory Visit: Payer: PRIVATE HEALTH INSURANCE | Admitting: Radiation Oncology

## 2022-07-19 ENCOUNTER — Encounter: Payer: Self-pay | Admitting: Hematology and Oncology

## 2022-07-22 ENCOUNTER — Telehealth: Payer: Self-pay | Admitting: Hematology and Oncology

## 2022-07-22 NOTE — Telephone Encounter (Signed)
Scheduled appointments per WQ. Patient is aware of all made appointments.

## 2022-07-23 ENCOUNTER — Inpatient Hospital Stay: Payer: Medicare Other

## 2022-07-23 ENCOUNTER — Inpatient Hospital Stay: Payer: Medicare Other | Attending: Hematology and Oncology | Admitting: Hematology and Oncology

## 2022-07-23 ENCOUNTER — Other Ambulatory Visit: Payer: Self-pay

## 2022-07-23 VITALS — BP 115/64 | HR 66 | Temp 97.8°F | Resp 13 | Wt 132.4 lb

## 2022-07-23 DIAGNOSIS — C50311 Malignant neoplasm of lower-inner quadrant of right female breast: Secondary | ICD-10-CM | POA: Insufficient documentation

## 2022-07-23 DIAGNOSIS — Z171 Estrogen receptor negative status [ER-]: Secondary | ICD-10-CM

## 2022-07-23 DIAGNOSIS — Z5112 Encounter for antineoplastic immunotherapy: Secondary | ICD-10-CM | POA: Diagnosis not present

## 2022-07-23 DIAGNOSIS — Z79899 Other long term (current) drug therapy: Secondary | ICD-10-CM | POA: Diagnosis not present

## 2022-07-23 LAB — CMP (CANCER CENTER ONLY)
ALT: 15 U/L (ref 0–44)
AST: 22 U/L (ref 15–41)
Albumin: 3.5 g/dL (ref 3.5–5.0)
Alkaline Phosphatase: 76 U/L (ref 38–126)
Anion gap: 6 (ref 5–15)
BUN: 18 mg/dL (ref 8–23)
CO2: 28 mmol/L (ref 22–32)
Calcium: 9.8 mg/dL (ref 8.9–10.3)
Chloride: 103 mmol/L (ref 98–111)
Creatinine: 0.76 mg/dL (ref 0.44–1.00)
GFR, Estimated: >60 mL/min (ref >60)
Glucose, Bld: 91 mg/dL (ref 70–99)
Potassium: 4.3 mmol/L (ref 3.5–5.1)
Sodium: 137 mmol/L (ref 135–145)
Total Bilirubin: 0.6 mg/dL (ref 0.3–1.2)
Total Protein: 7 g/dL (ref 6.5–8.1)

## 2022-07-23 LAB — CBC WITH DIFFERENTIAL (CANCER CENTER ONLY)
Abs Immature Granulocytes: 0.1 10*3/uL — ABNORMAL HIGH (ref 0.00–0.07)
Basophils Absolute: 0 10*3/uL (ref 0.0–0.1)
Basophils Relative: 1 %
Eosinophils Absolute: 0.2 10*3/uL (ref 0.0–0.5)
Eosinophils Relative: 2 %
HCT: 32 % — ABNORMAL LOW (ref 36.0–46.0)
Hemoglobin: 10.7 g/dL — ABNORMAL LOW (ref 12.0–15.0)
Immature Granulocytes: 1 %
Lymphocytes Relative: 19 %
Lymphs Abs: 1.4 10*3/uL (ref 0.7–4.0)
MCH: 29.7 pg (ref 26.0–34.0)
MCHC: 33.4 g/dL (ref 30.0–36.0)
MCV: 88.9 fL (ref 80.0–100.0)
Monocytes Absolute: 0.6 10*3/uL (ref 0.1–1.0)
Monocytes Relative: 8 %
Neutro Abs: 4.8 10*3/uL (ref 1.7–7.7)
Neutrophils Relative %: 69 %
Platelet Count: 271 10*3/uL (ref 150–400)
RBC: 3.6 MIL/uL — ABNORMAL LOW (ref 3.87–5.11)
RDW: 14 % (ref 11.5–15.5)
WBC Count: 7 10*3/uL (ref 4.0–10.5)
nRBC: 0 % (ref 0.0–0.2)

## 2022-07-23 MED ORDER — HEPARIN SOD (PORK) LOCK FLUSH 100 UNIT/ML IV SOLN
500.0000 [IU] | Freq: Once | INTRAVENOUS | Status: AC | PRN
  Administered 2022-07-23: 500 [IU]

## 2022-07-23 MED ORDER — SODIUM CHLORIDE 0.9 % IV SOLN
3.6000 mg/kg | Freq: Once | INTRAVENOUS | Status: AC
  Administered 2022-07-23: 220 mg via INTRAVENOUS
  Filled 2022-07-23: qty 8

## 2022-07-23 MED ORDER — DIPHENHYDRAMINE HCL 25 MG PO CAPS
25.0000 mg | ORAL_CAPSULE | Freq: Once | ORAL | Status: AC
  Administered 2022-07-23: 25 mg via ORAL
  Filled 2022-07-23: qty 1

## 2022-07-23 MED ORDER — SODIUM CHLORIDE 0.9 % IV SOLN: Freq: Once | INTRAVENOUS | Status: AC

## 2022-07-23 MED ORDER — PROCHLORPERAZINE MALEATE 10 MG PO TABS
10.0000 mg | ORAL_TABLET | Freq: Once | ORAL | Status: AC
  Administered 2022-07-23: 10 mg via ORAL
  Filled 2022-07-23: qty 1

## 2022-07-23 MED ORDER — ACETAMINOPHEN 325 MG PO TABS
650.0000 mg | ORAL_TABLET | Freq: Once | ORAL | Status: AC
  Administered 2022-07-23: 650 mg via ORAL
  Filled 2022-07-23: qty 2

## 2022-07-23 MED ORDER — SODIUM CHLORIDE 0.9% FLUSH
10.0000 mL | INTRAVENOUS | Status: DC | PRN
  Administered 2022-07-23: 10 mL

## 2022-07-23 NOTE — Patient Instructions (Signed)
[facility]  Discharge Instructions: Thank you for choosing [facility] to provide your oncology and hematology care.   If you have a lab appointment with the [facility], please go directly to the [facility] and check in at the registration area.   Wear comfortable clothing and clothing appropriate for easy access to any Portacath or PICC line.   We strive to give you quality time with your provider. You may need to reschedule your appointment if you arrive late (15 or more minutes).  Arriving late affects you and other patients whose appointments are after yours.  Also, if you miss three or more appointments without notifying the office, you may be dismissed from the clinic at the provider's discretion.      For prescription refill requests, have your pharmacy contact our office and allow 72 hours for refills to be completed.    Today you received the following chemotherapy and/or immunotherapy agents: ado-trastuzumab-emtansine      To help prevent nausea and vomiting after your treatment, we encourage you to take your nausea medication as directed.  BELOW ARE SYMPTOMS THAT SHOULD BE REPORTED IMMEDIATELY: *FEVER GREATER THAN 100.4 F (38 C) OR HIGHER *CHILLS OR SWEATING *NAUSEA AND VOMITING THAT IS NOT CONTROLLED WITH YOUR NAUSEA MEDICATION *UNUSUAL SHORTNESS OF BREATH *UNUSUAL BRUISING OR BLEEDING *URINARY PROBLEMS (pain or burning when urinating, or frequent urination) *BOWEL PROBLEMS (unusual diarrhea, constipation, pain near the anus) TENDERNESS IN MOUTH AND THROAT WITH OR WITHOUT PRESENCE OF ULCERS (sore throat, sores in mouth, or a toothache) UNUSUAL RASH, SWELLING OR PAIN  UNUSUAL VAGINAL DISCHARGE OR ITCHING   Items with * indicate a potential emergency and should be followed up as soon as possible or go to the Emergency Department if any problems should occur.  Please show the CHEMOTHERAPY ALERT CARD or IMMUNOTHERAPY ALERT CARD  at check-in to the Emergency Department and triage nurse.  Should you have questions after your visit or need to cancel or reschedule your appointment, please contact [facility]  Dept: [phone]  and follow the prompts.  Office hours are 8:00 a.m. to 4:30 p.m. Monday - Friday. Please note that voicemails left after 4:00 p.m. may not be returned until the following business day.  We are closed weekends and major holidays. You have access to a nurse at all times for urgent questions. Please call the main number to the clinic Dept: [phone] and follow the prompts.   For any non-urgent questions, you may also contact your provider using MyChart. We now offer e-Visits for anyone 70 and older to request care online for non-urgent symptoms. For details visit [email or web address].   Also download the MyChart app! Go to the app store, search "MyChart", open the app, select [facility], and log in with your MyChart username and password.

## 2022-07-23 NOTE — Progress Notes (Signed)
Patient Care Team: Raelene Bott, MD as PCP - General (Internal Medicine) Erroll Luna, MD as Consulting Physician (General Surgery) Nicholas Lose, MD as Consulting Physician (Hematology and Oncology) Eppie Gibson, MD as Attending Physician (Radiation Oncology) Janina Mayo, MD as Consulting Physician (Cardiology)  DIAGNOSIS:  Encounter Diagnosis  Name Primary?   Malignant neoplasm of lower-inner quadrant of right breast of female, estrogen receptor negative ([place]) Yes    SUMMARY OF ONCOLOGIC HISTORY: Oncology History  Malignant neoplasm of lower-inner quadrant of right breast of female, estrogen receptor negative ([place])  11/28/2021 Initial Diagnosis   Palpable right breast lump 2.6 cm with calcifications at 3 o'clock position, additional 1.3 cm lesion is an intramammary lymph node biopsy: Benign, axilla negative, biopsy of the breast lump: Grade 3 IDC ER 0%, PR 0%, Ki-67 98%, HER2 3+ positive   12/05/2021 Cancer Staging   Staging form: Breast, AJCC 8th Edition - Clinical: Stage IIA (cT2, cN0, cM0, G3, ER-, PR-, HER2+) - Signed by Nicholas Lose, MD on 12/05/2021 Stage prefix: Initial diagnosis Histologic grading system: 3 grade system   12/26/2021 - 03/12/2022 Chemotherapy   Patient is on Treatment Plan : BREAST Paclitaxel + Trastuzumab q7d / Trastuzumab q21d      Genetic Testing   [facility] CustomNext Panel was Negative. Report date is 12/18/2021.  The CustomNext-Cancer+RNAinsight panel offered by [facility]orts includes sequencing and rearrangement analysis for the following 47 genes:  APC, ATM, AXIN2, BARD1, BMPR1A, BRCA1, BRCA2, BRIP1, CDH1, CDK4, CDKN2A, CHEK2, CTNNA1, DICER1, EPCAM, GREM1, HOXB13, KIT, MEN1, MLH1, MSH2, MSH3, MSH6, MUTYH, NBN, NF1, NTHL1, PALB2, PDGFRA, PMS2, POLD1, POLE, PTEN, RAD50, RAD51C, RAD51D, SDHA, SDHB, SDHC, SDHD, SMAD4, SMARCA4, STK11, TP53, TSC1, TSC2, and VHL.  RNA data is routinely analyzed for use in variant interpretation for all genes.    03/27/2022 Surgery   Right breast mastectomy: Invasive ductal carcinoma Nottingham grade 3 with no morphologic evidence of neoadjuvant chemotherapy effect negative for lymphovascular and perineural invasion the size noted was 4.2 x 3.7 x 3.5 cm margins were negative and 2 lymph nodes biopsied were negative. Cancer staging: ypT2 pN0 pM0   04/08/2022 -  Chemotherapy   Patient is on Treatment Plan : BREAST ADO-Trastuzumab Emtansine (Kadcyla) q21d     05/06/2022 - 06/13/2022 Radiation Therapy   Adjuvant radiation therapy     CHIEF COMPLIANT: Kadcyla cycle 6  INTERVAL HISTORY: Sydney Key is a 88 y.o. female is here because of recent diagnosis of right breast cancer. She presents to the clinic today for a follow-up. She states that she is tolerating treatment extremely well. She says her fingers are fine seems like her legs a little weak. She reports that she had a little cough.    ALLERGIES:  is allergic to paclitaxel.  MEDICATIONS:  Current Outpatient Medications  Medication Sig Dispense Refill   acetaminophen (TYLENOL) 500 MG tablet Take 500 mg by mouth at bedtime.     Ascorbic Acid (VITAMIN C WITH ROSE HIPS) 500 MG tablet Take 500 mg by mouth daily.     aspirin EC 81 MG tablet Take 81 mg by mouth every evening.     brimonidine (ALPHAGAN) 0.15 % ophthalmic solution Place 1 drop into the left eye 2 (two) times daily.     carvedilol (COREG) 3.125 MG tablet Take 1 tablet (3.125 mg total) by mouth 2 (two) times daily. 180 tablet 3   cholecalciferol (VITAMIN D) 1000 UNITS tablet Take 1,000 Units by mouth daily.     cyanocobalamin (,VITAMIN B-12,) 1000  MCG/ML injection Inject 1,000 mcg into the muscle every 30 (thirty) days.     escitalopram (LEXAPRO) 10 MG tablet Take 5 mg by mouth at bedtime.     gabapentin (NEURONTIN) 300 MG capsule Take 300 mg by mouth at bedtime.     meloxicam (MOBIC) 15 MG tablet Take 15 mg by mouth.     Multiple Vitamin (MULTIVITAMIN WITH MINERALS) TABS tablet Take 1  tablet by mouth daily.     No current facility-administered medications for this visit.    PHYSICAL EXAMINATION: ECOG PERFORMANCE STATUS: 1 - Symptomatic but completely ambulatory  Vitals:   07/23/22 1039  BP: 115/64  Pulse: 66  Resp: 13  Temp: 97.8 F (36.6 C)  SpO2: 95%   Filed Weights   07/23/22 1039  Weight: 132 lb 6.4 oz (60.1 kg)      LABORATORY DATA:  I have reviewed the data as listed    Latest Ref Rng & Units 07/23/2022   10:12 AM 07/02/2022   10:02 AM 06/11/2022   10:16 AM  CMP  Glucose 70 - 99 mg/dL 91  101  101   BUN 8 - 23 mg/dL '18  17  18   '$ Creatinine 0.44 - 1.00 mg/dL 0.76  0.70  0.69   Sodium 135 - 145 mmol/L 137  139  140   Potassium 3.5 - 5.1 mmol/L 4.3  4.2  4.0   Chloride 98 - 111 mmol/L 103  105  106   CO2 22 - 32 mmol/L '28  28  29   '$ Calcium 8.9 - 10.3 mg/dL 9.8  9.4  9.6   Total Protein 6.5 - 8.1 g/dL 7.0  6.6  6.6   Total Bilirubin 0.3 - 1.2 mg/dL 0.6  0.8  0.9   Alkaline Phos 38 - 126 U/L 76  60  65   AST 15 - 41 U/L '22  27  27   '$ ALT 0 - 44 U/L '15  19  18     '$ Lab Results  Component Value Date   WBC 7.0 07/23/2022   HGB 10.7 (L) 07/23/2022   HCT 32.0 (L) 07/23/2022   MCV 88.9 07/23/2022   PLT 271 07/23/2022   NEUTROABS 4.8 07/23/2022    ASSESSMENT & PLAN:  Malignant neoplasm of lower-inner quadrant of right breast of female, estrogen receptor negative ([place]) 11/28/2021:Palpable right breast lump 2.6 cm with calcifications at 3 o'clock position, additional 1.3 cm lesion is an intramammary lymph node biopsy: Benign, axilla negative, biopsy of the breast lump: Grade 3 IDC ER 0%, PR 0%, Ki-67 98%, HER2 3+ positive   Treatment plan: 1.  Neoadjuvant chemotherapy with Taxol Herceptin weekly x12 followed by Kadcyla maintenance 2. Rt Mastectomy with sentinel lymph node biopsy: 03/27/2022: 4.2 cm grade 3 IDC with negative margins 0/2 lymph nodes negative, ER 0%, PR 0%, Ki-67 80%, HER2 3+ positive 3.  +/- Adjuvant  radiation ----------------------------------------------------------------------------------------------------------------- Current treatment: Kadcyla cycle 6 Kadcyla toxicities: 1.  Constipation: I encouraged her to take a stool softener regularly and Dulcolax as needed. 2. slight hair thinning   Radiation will start 05/07/2022 Return to clinic every 3 weeks for Kadcyla   No orders of the defined types were placed in this encounter.  The patient has a good understanding of the overall plan. she agrees with it. she will call with any problems that may develop before the next visit here. Total time spent: 30 mins including face to face time and time spent for planning, charting and co-ordination of  care   Harriette Ohara, MD 07/23/22    I Gardiner Coins am acting as a Education administrator for [employer]  I have reviewed the above documentation for accuracy and completeness, and I agree with the above.

## 2022-07-23 NOTE — Assessment & Plan Note (Signed)
11/28/2021:Palpable right breast lump 2.6 cm with calcifications at 3 o'clock position, additional 1.3 cm lesion is an intramammary lymph node biopsy: Benign, axilla negative, biopsy of the breast lump: Grade 3 IDC ER 0%, PR 0%, Ki-67 98%, HER2 3+ positive   Treatment plan: 1.  Neoadjuvant chemotherapy with Taxol Herceptin weekly x12 followed by Kadcyla maintenance 2. Rt Mastectomy with sentinel lymph node biopsy: 03/27/2022: 4.2 cm grade 3 IDC with negative margins 0/2 lymph nodes negative, ER 0%, PR 0%, Ki-67 80%, HER2 3+ positive 3.  +/- Adjuvant radiation ----------------------------------------------------------------------------------------------------------------- Current treatment: Kadcyla cycle 6 Kadcyla toxicities: 1.  Constipation: I encouraged her to take a stool softener regularly and Dulcolax as needed. 2. slight hair thinning   Radiation will start 05/07/2022 Return to clinic every 3 weeks for [facility]

## 2022-07-25 ENCOUNTER — Encounter: Payer: Self-pay | Admitting: *Deleted

## 2022-08-02 DIAGNOSIS — Z79899 Other long term (current) drug therapy: Secondary | ICD-10-CM | POA: Diagnosis not present

## 2022-08-02 DIAGNOSIS — Z7982 Long term (current) use of aspirin: Secondary | ICD-10-CM | POA: Diagnosis not present

## 2022-08-02 DIAGNOSIS — R051 Acute cough: Secondary | ICD-10-CM | POA: Diagnosis not present

## 2022-08-02 DIAGNOSIS — G8929 Other chronic pain: Secondary | ICD-10-CM | POA: Diagnosis not present

## 2022-08-02 DIAGNOSIS — Z791 Long term (current) use of non-steroidal anti-inflammatories (NSAID): Secondary | ICD-10-CM | POA: Diagnosis not present

## 2022-08-02 DIAGNOSIS — M25551 Pain in right hip: Secondary | ICD-10-CM | POA: Diagnosis not present

## 2022-08-02 DIAGNOSIS — Z825 Family history of asthma and other chronic lower respiratory diseases: Secondary | ICD-10-CM | POA: Diagnosis not present

## 2022-08-02 DIAGNOSIS — Z7983 Long term (current) use of bisphosphonates: Secondary | ICD-10-CM | POA: Diagnosis not present

## 2022-08-02 DIAGNOSIS — M545 Low back pain, unspecified: Secondary | ICD-10-CM | POA: Diagnosis not present

## 2022-08-02 DIAGNOSIS — R059 Cough, unspecified: Secondary | ICD-10-CM | POA: Diagnosis not present

## 2022-08-02 DIAGNOSIS — T07XXXA Unspecified multiple injuries, initial encounter: Secondary | ICD-10-CM | POA: Diagnosis not present

## 2022-08-02 DIAGNOSIS — M1611 Unilateral primary osteoarthritis, right hip: Secondary | ICD-10-CM | POA: Diagnosis not present

## 2022-08-13 ENCOUNTER — Inpatient Hospital Stay: Payer: Medicare Other

## 2022-08-13 ENCOUNTER — Other Ambulatory Visit: Payer: Self-pay

## 2022-08-13 ENCOUNTER — Inpatient Hospital Stay: Payer: Medicare Other | Attending: Hematology and Oncology

## 2022-08-13 ENCOUNTER — Inpatient Hospital Stay (HOSPITAL_BASED_OUTPATIENT_CLINIC_OR_DEPARTMENT_OTHER): Payer: Medicare Other | Admitting: Hematology and Oncology

## 2022-08-13 VITALS — BP 123/64 | HR 63 | Temp 97.2°F | Resp 18 | Wt 135.2 lb

## 2022-08-13 VITALS — BP 127/58 | HR 67 | Temp 97.7°F | Resp 18

## 2022-08-13 DIAGNOSIS — Z171 Estrogen receptor negative status [ER-]: Secondary | ICD-10-CM | POA: Diagnosis not present

## 2022-08-13 DIAGNOSIS — C50311 Malignant neoplasm of lower-inner quadrant of right female breast: Secondary | ICD-10-CM

## 2022-08-13 DIAGNOSIS — Z79899 Other long term (current) drug therapy: Secondary | ICD-10-CM | POA: Diagnosis not present

## 2022-08-13 DIAGNOSIS — Z95828 Presence of other vascular implants and grafts: Secondary | ICD-10-CM

## 2022-08-13 DIAGNOSIS — Z5112 Encounter for antineoplastic immunotherapy: Secondary | ICD-10-CM | POA: Insufficient documentation

## 2022-08-13 LAB — CMP (CANCER CENTER ONLY)
ALT: 21 U/L (ref 0–44)
AST: 29 U/L (ref 15–41)
Albumin: 3.6 g/dL (ref 3.5–5.0)
Alkaline Phosphatase: 61 U/L (ref 38–126)
Anion gap: 6 (ref 5–15)
BUN: 22 mg/dL (ref 8–23)
CO2: 28 mmol/L (ref 22–32)
Calcium: 9.5 mg/dL (ref 8.9–10.3)
Chloride: 106 mmol/L (ref 98–111)
Creatinine: 0.65 mg/dL (ref 0.44–1.00)
GFR, Estimated: >60 mL/min (ref >60)
Glucose, Bld: 91 mg/dL (ref 70–99)
Potassium: 3.8 mmol/L (ref 3.5–5.1)
Sodium: 140 mmol/L (ref 135–145)
Total Bilirubin: 0.7 mg/dL (ref 0.3–1.2)
Total Protein: 6.3 g/dL — ABNORMAL LOW (ref 6.5–8.1)

## 2022-08-13 LAB — CBC WITH DIFFERENTIAL (CANCER CENTER ONLY)
Abs Immature Granulocytes: 0.01 10*3/uL (ref 0.00–0.07)
Basophils Absolute: 0 10*3/uL (ref 0.0–0.1)
Basophils Relative: 1 %
Eosinophils Absolute: 0.2 10*3/uL (ref 0.0–0.5)
Eosinophils Relative: 5 %
HCT: 30.7 % — ABNORMAL LOW (ref 36.0–46.0)
Hemoglobin: 10.1 g/dL — ABNORMAL LOW (ref 12.0–15.0)
Immature Granulocytes: 0 %
Lymphocytes Relative: 29 %
Lymphs Abs: 1.2 10*3/uL (ref 0.7–4.0)
MCH: 29.9 pg (ref 26.0–34.0)
MCHC: 32.9 g/dL (ref 30.0–36.0)
MCV: 90.8 fL (ref 80.0–100.0)
Monocytes Absolute: 0.4 10*3/uL (ref 0.1–1.0)
Monocytes Relative: 11 %
Neutro Abs: 2.2 10*3/uL (ref 1.7–7.7)
Neutrophils Relative %: 54 %
Platelet Count: 170 10*3/uL (ref 150–400)
RBC: 3.38 MIL/uL — ABNORMAL LOW (ref 3.87–5.11)
RDW: 15.4 % (ref 11.5–15.5)
WBC Count: 4.1 10*3/uL (ref 4.0–10.5)
nRBC: 0 % (ref 0.0–0.2)

## 2022-08-13 MED ORDER — DIPHENHYDRAMINE HCL 25 MG PO CAPS
25.0000 mg | ORAL_CAPSULE | Freq: Once | ORAL | Status: AC
  Administered 2022-08-13: 25 mg via ORAL
  Filled 2022-08-13: qty 1

## 2022-08-13 MED ORDER — SODIUM CHLORIDE 0.9 % IV SOLN: Freq: Once | INTRAVENOUS | Status: AC

## 2022-08-13 MED ORDER — ACETAMINOPHEN 325 MG PO TABS
650.0000 mg | ORAL_TABLET | Freq: Once | ORAL | Status: AC
  Administered 2022-08-13: 650 mg via ORAL
  Filled 2022-08-13: qty 2

## 2022-08-13 MED ORDER — SODIUM CHLORIDE 0.9% FLUSH
10.0000 mL | INTRAVENOUS | Status: DC | PRN
  Administered 2022-08-13: 10 mL

## 2022-08-13 MED ORDER — SODIUM CHLORIDE 0.9 % IV SOLN
3.6000 mg/kg | Freq: Once | INTRAVENOUS | Status: AC
  Administered 2022-08-13: 220 mg via INTRAVENOUS
  Filled 2022-08-13: qty 8

## 2022-08-13 MED ORDER — SODIUM CHLORIDE 0.9% FLUSH
10.0000 mL | Freq: Once | INTRAVENOUS | Status: AC
  Administered 2022-08-13: 10 mL

## 2022-08-13 MED ORDER — PROCHLORPERAZINE MALEATE 10 MG PO TABS
10.0000 mg | ORAL_TABLET | Freq: Once | ORAL | Status: AC
  Administered 2022-08-13: 10 mg via ORAL
  Filled 2022-08-13: qty 1

## 2022-08-13 MED ORDER — HEPARIN SOD (PORK) LOCK FLUSH 100 UNIT/ML IV SOLN
500.0000 [IU] | Freq: Once | INTRAVENOUS | Status: AC | PRN
  Administered 2022-08-13: 500 [IU]

## 2022-08-13 NOTE — Progress Notes (Signed)
Patient Care Team: Raelene Bott, MD as PCP - General (Internal Medicine) Erroll Luna, MD as Consulting Physician (General Surgery) Nicholas Lose, MD as Consulting Physician (Hematology and Oncology) Eppie Gibson, MD as Attending Physician (Radiation Oncology) Janina Mayo, MD as Consulting Physician (Cardiology)  DIAGNOSIS:  Encounter Diagnosis  Name Primary?   Malignant neoplasm of lower-inner quadrant of right breast of female, estrogen receptor negative ([place]) Yes    SUMMARY OF ONCOLOGIC HISTORY: Oncology History  Malignant neoplasm of lower-inner quadrant of right breast of female, estrogen receptor negative ([place])  11/28/2021 Initial Diagnosis   Palpable right breast lump 2.6 cm with calcifications at 3 o'clock position, additional 1.3 cm lesion is an intramammary lymph node biopsy: Benign, axilla negative, biopsy of the breast lump: Grade 3 IDC ER 0%, PR 0%, Ki-67 98%, HER2 3+ positive   12/05/2021 Cancer Staging   Staging form: Breast, AJCC 8th Edition - Clinical: Stage IIA (cT2, cN0, cM0, G3, ER-, PR-, HER2+) - Signed by Nicholas Lose, MD on 12/05/2021 Stage prefix: Initial diagnosis Histologic grading system: 3 grade system   12/26/2021 - 03/12/2022 Chemotherapy   Patient is on Treatment Plan : BREAST Paclitaxel + Trastuzumab q7d / Trastuzumab q21d      Genetic Testing   [facility] CustomNext Panel was Negative. Report date is 12/18/2021.  The CustomNext-Cancer+RNAinsight panel offered by [facility] Forts includes sequencing and rearrangement analysis for the following 47 genes:  APC, ATM, AXIN2, BARD1, BMPR1A, BRCA1, BRCA2, BRIP1, CDH1, CDK4, CDKN2A, CHEK2, CTNNA1, DICER1, EPCAM, GREM1, HOXB13, KIT, MEN1, MLH1, MSH2, MSH3, MSH6, MUTYH, NBN, NF1, NTHL1, PALB2, PDGFRA, PMS2, POLD1, POLE, PTEN, RAD50, RAD51C, RAD51D, SDHA, SDHB, SDHC, SDHD, SMAD4, SMARCA4, STK11, TP53, TSC1, TSC2, and VHL.  RNA data is routinely analyzed for use in variant interpretation for all genes.    03/27/2022 Surgery   Right breast mastectomy: Invasive ductal carcinoma Nottingham grade 3 with no morphologic evidence of neoadjuvant chemotherapy effect negative for lymphovascular and perineural invasion the size noted was 4.2 x 3.7 x 3.5 cm margins were negative and 2 lymph nodes biopsied were negative. Cancer staging: ypT2 pN0 pM0   04/08/2022 -  Chemotherapy   Patient is on Treatment Plan : BREAST ADO-Trastuzumab Emtansine (Kadcyla) q21d     05/06/2022 - 06/13/2022 Radiation Therapy   Adjuvant radiation therapy     CHIEF COMPLIANT: Follow-up Kadcyla cycle 7  INTERVAL HISTORY: Sydney Key is a 89 y.o. female is here because of recent diagnosis of right breast cancer. She presents to the clinic today for a follow-up. She reports that she has been having pain in her right hip. She had to go to the hospital she says after she exercise she notice the pain. She says she not in a lot of pain today. She say it gets better when she is sitting.    ALLERGIES:  is allergic to paclitaxel.  MEDICATIONS:  Current Outpatient Medications  Medication Sig Dispense Refill   acetaminophen (TYLENOL) 500 MG tablet Take 500 mg by mouth at bedtime.     Ascorbic Acid (VITAMIN C WITH ROSE HIPS) 500 MG tablet Take 500 mg by mouth daily.     aspirin EC 81 MG tablet Take 81 mg by mouth every evening.     brimonidine (ALPHAGAN) 0.15 % ophthalmic solution Place 1 drop into the left eye 2 (two) times daily.     carvedilol (COREG) 3.125 MG tablet Take 1 tablet (3.125 mg total) by mouth 2 (two) times daily. 180 tablet 3   cholecalciferol (VITAMIN  D) 1000 UNITS tablet Take 1,000 Units by mouth daily.     cyanocobalamin (,VITAMIN B-12,) 1000 MCG/ML injection Inject 1,000 mcg into the muscle every 30 (thirty) days.     escitalopram (LEXAPRO) 10 MG tablet Take 5 mg by mouth at bedtime.     gabapentin (NEURONTIN) 300 MG capsule Take 300 mg by mouth at bedtime.     meloxicam (MOBIC) 15 MG tablet Take 15 mg by mouth.      Multiple Vitamin (MULTIVITAMIN WITH MINERALS) TABS tablet Take 1 tablet by mouth daily.     No current facility-administered medications for this visit.    PHYSICAL EXAMINATION: ECOG PERFORMANCE STATUS: 1 - Symptomatic but completely ambulatory  Vitals:   08/13/22 0950  BP: 123/64  Pulse: 63  Resp: 18  Temp: (!) 97.2 F (36.2 C)  SpO2: 100%   Filed Weights   08/13/22 0950  Weight: 135 lb 3 oz (61.3 kg)      LABORATORY DATA:  I have reviewed the data as listed    Latest Ref Rng & Units 08/13/2022    9:25 AM 07/23/2022   10:12 AM 07/02/2022   10:02 AM  CMP  Glucose 70 - 99 mg/dL 91  91  101   BUN 8 - 23 mg/dL '22  18  17   '$ Creatinine 0.44 - 1.00 mg/dL 0.65  0.76  0.70   Sodium 135 - 145 mmol/L 140  137  139   Potassium 3.5 - 5.1 mmol/L 3.8  4.3  4.2   Chloride 98 - 111 mmol/L 106  103  105   CO2 22 - 32 mmol/L '28  28  28   '$ Calcium 8.9 - 10.3 mg/dL 9.5  9.8  9.4   Total Protein 6.5 - 8.1 g/dL 6.3  7.0  6.6   Total Bilirubin 0.3 - 1.2 mg/dL 0.7  0.6  0.8   Alkaline Phos 38 - 126 U/L 61  76  60   AST 15 - 41 U/L '29  22  27   '$ ALT 0 - 44 U/L '21  15  19     '$ Lab Results  Component Value Date   WBC 4.1 08/13/2022   HGB 10.1 (L) 08/13/2022   HCT 30.7 (L) 08/13/2022   MCV 90.8 08/13/2022   PLT 170 08/13/2022   NEUTROABS 2.2 08/13/2022    ASSESSMENT & PLAN:  Malignant neoplasm of lower-inner quadrant of right breast of female, estrogen receptor negative ([facility]) 11/28/2021:Palpable right breast lump 2.6 cm with calcifications at 3 o'clock position, additional 1.3 cm lesion is an intramammary lymph node biopsy: Benign, axilla negative, biopsy of the breast lump: Grade 3 IDC ER 0%, PR 0%, Ki-67 98%, HER2 3+ positive   Treatment plan: 1.  Neoadjuvant chemotherapy with Taxol Herceptin weekly x12 followed by Kadcyla maintenance 2. Rt Mastectomy with sentinel lymph node biopsy: 03/27/2022: 4.2 cm grade 3 IDC with negative margins 0/2 lymph nodes negative, ER 0%, PR 0%, Ki-67 80%,  HER2 3+ positive 3.  +/- Adjuvant radiation ----------------------------------------------------------------------------------------------------------------- Current treatment: Kadcyla cycle 7 Kadcyla toxicities: 1.  Constipation: I encouraged her to take a stool softener regularly and Dulcolax as needed. 2. slight hair thinning 3.  Anemia: Today's hemoglobin is 7.1.  We are monitoring   Emergency room visit [place] for right hip pain: Diagnosed with osteoarthritis.  I discussed with her about an orthopedic referral but she wants to wait and watch and see if it gets better on its own.  She takes meloxicam and  over-the-counter pain cream.   Radiation will start 05/07/2022 Return to clinic every 3 weeks for Kadcyla      No orders of the defined types were placed in this encounter.  The patient has a good understanding of the overall plan. she agrees with it. she will call with any problems that may develop before the next visit here. Total time spent: 30 mins including face to face time and time spent for planning, charting and co-ordination of care   Harriette Ohara, MD 08/13/22    I Gardiner Coins am acting as a Education administrator for [employer]  I have reviewed the above documentation for accuracy and completeness, and I agree with the above.

## 2022-08-13 NOTE — Assessment & Plan Note (Addendum)
11/28/2021:Palpable right breast lump 2.6 cm with calcifications at 3 o'clock position, additional 1.3 cm lesion is an intramammary lymph node biopsy: Benign, axilla negative, biopsy of the breast lump: Grade 3 IDC ER 0%, PR 0%, Ki-67 98%, HER2 3+ positive   Treatment plan: 1.  Neoadjuvant chemotherapy with Taxol Herceptin weekly x12 followed by Kadcyla maintenance 2. Rt Mastectomy with sentinel lymph node biopsy: 03/27/2022: 4.2 cm grade 3 IDC with negative margins 0/2 lymph nodes negative, ER 0%, PR 0%, Ki-67 80%, HER2 3+ positive 3.  +/- Adjuvant radiation ----------------------------------------------------------------------------------------------------------------- Current treatment: Kadcyla cycle 7 Kadcyla toxicities: 1.  Constipation: I encouraged her to take a stool softener regularly and Dulcolax as needed. 2. slight hair thinning 3.  Anemia: Today's hemoglobin is 7.1.  We are monitoring   Emergency room visit [place] for right hip pain: Diagnosed with osteoarthritis.  I discussed with her about an orthopedic referral but she wants to wait and watch and see if it gets better on its own.  She takes meloxicam and over-the-counter pain cream.   Radiation will start 05/07/2022 Return to clinic every 3 weeks for [facility]

## 2022-08-13 NOTE — Patient Instructions (Signed)
[facility]  Discharge Instructions: Thank you for choosing [facility] to provide your oncology and hematology care.   If you have a lab appointment with the [facility], please go directly to the [facility] and check in at the registration area.   Wear comfortable clothing and clothing appropriate for easy access to any Portacath or PICC line.   We strive to give you quality time with your provider. You may need to reschedule your appointment if you arrive late (15 or more minutes).  Arriving late affects you and other patients whose appointments are after yours.  Also, if you miss three or more appointments without notifying the office, you may be dismissed from the clinic at the provider's discretion.      For prescription refill requests, have your pharmacy contact our office and allow 72 hours for refills to be completed.    Today you received the following chemotherapy and/or immunotherapy agents: Ado-Trastuzumab-Emtansine      To help prevent nausea and vomiting after your treatment, we encourage you to take your nausea medication as directed.  BELOW ARE SYMPTOMS THAT SHOULD BE REPORTED IMMEDIATELY: *FEVER GREATER THAN 100.4 F (38 C) OR HIGHER *CHILLS OR SWEATING *NAUSEA AND VOMITING THAT IS NOT CONTROLLED WITH YOUR NAUSEA MEDICATION *UNUSUAL SHORTNESS OF BREATH *UNUSUAL BRUISING OR BLEEDING *URINARY PROBLEMS (pain or burning when urinating, or frequent urination) *BOWEL PROBLEMS (unusual diarrhea, constipation, pain near the anus) TENDERNESS IN MOUTH AND THROAT WITH OR WITHOUT PRESENCE OF ULCERS (sore throat, sores in mouth, or a toothache) UNUSUAL RASH, SWELLING OR PAIN  UNUSUAL VAGINAL DISCHARGE OR ITCHING   Items with * indicate a potential emergency and should be followed up as soon as possible or go to the Emergency Department if any problems should occur.  Please show the CHEMOTHERAPY ALERT CARD or IMMUNOTHERAPY  ALERT CARD at check-in to the Emergency Department and triage nurse.  Should you have questions after your visit or need to cancel or reschedule your appointment, please contact [facility]  Dept: [phone]  and follow the prompts.  Office hours are 8:00 a.m. to 4:30 p.m. Monday - Friday. Please note that voicemails left after 4:00 p.m. may not be returned until the following business day.  We are closed weekends and major holidays. You have access to a nurse at all times for urgent questions. Please call the main number to the clinic Dept: [phone] and follow the prompts.   For any non-urgent questions, you may also contact your provider using MyChart. We now offer e-Visits for anyone 84 and older to request care online for non-urgent symptoms. For details visit [email or web address].   Also download the MyChart app! Go to the app store, search "MyChart", open the app, select [facility], and log in with your MyChart username and password.

## 2022-08-20 DIAGNOSIS — J40 Bronchitis, not specified as acute or chronic: Secondary | ICD-10-CM | POA: Diagnosis not present

## 2022-08-20 DIAGNOSIS — G2581 Restless legs syndrome: Secondary | ICD-10-CM | POA: Diagnosis not present

## 2022-08-20 DIAGNOSIS — M1611 Unilateral primary osteoarthritis, right hip: Secondary | ICD-10-CM | POA: Diagnosis not present

## 2022-08-20 DIAGNOSIS — Z0000 Encounter for general adult medical examination without abnormal findings: Secondary | ICD-10-CM | POA: Diagnosis not present

## 2022-08-29 NOTE — Progress Notes (Signed)
Patient Care Team: Raelene Bott, MD as PCP - General (Internal Medicine) Erroll Luna, MD as Consulting Physician (General Surgery) Nicholas Lose, MD as Consulting Physician (Hematology and Oncology) Eppie Gibson, MD as Attending Physician (Radiation Oncology) Janina Mayo, MD as Consulting Physician (Cardiology)  DIAGNOSIS: No diagnosis found.  SUMMARY OF ONCOLOGIC HISTORY: Oncology History  Malignant neoplasm of lower-inner quadrant of right breast of female, estrogen receptor negative ([place])  11/28/2021 Initial Diagnosis   Palpable right breast lump 2.6 cm with calcifications at 3 o'clock position, additional 1.3 cm lesion is an intramammary lymph node biopsy: Benign, axilla negative, biopsy of the breast lump: Grade 3 IDC ER 0%, PR 0%, Ki-67 98%, HER2 3+ positive   12/05/2021 Cancer Staging   Staging form: Breast, AJCC 8th Edition - Clinical: Stage IIA (cT2, cN0, cM0, G3, ER-, PR-, HER2+) - Signed by Nicholas Lose, MD on 12/05/2021 Stage prefix: Initial diagnosis Histologic grading system: 3 grade system   12/26/2021 - 03/12/2022 Chemotherapy   Patient is on Treatment Plan : BREAST Paclitaxel + Trastuzumab q7d / Trastuzumab q21d      Genetic Testing   [facility] CustomNext Panel was Negative. Report date is 12/18/2021.  The CustomNext-Cancer+RNAinsight panel offered by [employer] Forts includes sequencing and rearrangement analysis for the following 47 genes:  APC, ATM, AXIN2, BARD1, BMPR1A, BRCA1, BRCA2, BRIP1, CDH1, CDK4, CDKN2A, CHEK2, CTNNA1, DICER1, EPCAM, GREM1, HOXB13, KIT, MEN1, MLH1, MSH2, MSH3, MSH6, MUTYH, NBN, NF1, NTHL1, PALB2, PDGFRA, PMS2, POLD1, POLE, PTEN, RAD50, RAD51C, RAD51D, SDHA, SDHB, SDHC, SDHD, SMAD4, SMARCA4, STK11, TP53, TSC1, TSC2, and VHL.  RNA data is routinely analyzed for use in variant interpretation for all genes.   03/27/2022 Surgery   Right breast mastectomy: Invasive ductal carcinoma Nottingham grade 3 with no morphologic evidence of neoadjuvant  chemotherapy effect negative for lymphovascular and perineural invasion the size noted was 4.2 x 3.7 x 3.5 cm margins were negative and 2 lymph nodes biopsied were negative. Cancer staging: ypT2 pN0 pM0   04/08/2022 -  Chemotherapy   Patient is on Treatment Plan : BREAST ADO-Trastuzumab Emtansine (Kadcyla) q21d     05/06/2022 - 06/13/2022 Radiation Therapy   Adjuvant radiation therapy     CHIEF COMPLIANT: Follow-up Kadcyla cycle 8  INTERVAL HISTORY: Sydney Key is a 89 y.o. female is here because of recent diagnosis of right breast cancer. She presents to the clinic today for a follow-up.    ALLERGIES:  is allergic to paclitaxel.  MEDICATIONS:  Current Outpatient Medications  Medication Sig Dispense Refill   acetaminophen (TYLENOL) 500 MG tablet Take 500 mg by mouth at bedtime.     Ascorbic Acid (VITAMIN C WITH ROSE HIPS) 500 MG tablet Take 500 mg by mouth daily.     aspirin EC 81 MG tablet Take 81 mg by mouth every evening.     brimonidine (ALPHAGAN) 0.15 % ophthalmic solution Place 1 drop into the left eye 2 (two) times daily.     carvedilol (COREG) 3.125 MG tablet Take 1 tablet (3.125 mg total) by mouth 2 (two) times daily. 180 tablet 3   cholecalciferol (VITAMIN D) 1000 UNITS tablet Take 1,000 Units by mouth daily.     cyanocobalamin (,VITAMIN B-12,) 1000 MCG/ML injection Inject 1,000 mcg into the muscle every 30 (thirty) days.     escitalopram (LEXAPRO) 10 MG tablet Take 5 mg by mouth at bedtime.     gabapentin (NEURONTIN) 300 MG capsule Take 300 mg by mouth at bedtime.     meloxicam (MOBIC) 15  MG tablet Take 15 mg by mouth.     Multiple Vitamin (MULTIVITAMIN WITH MINERALS) TABS tablet Take 1 tablet by mouth daily.     No current facility-administered medications for this visit.    PHYSICAL EXAMINATION: ECOG PERFORMANCE STATUS: {CHL ONC ECOG [phone]}  There were no vitals filed for this visit. There were no vitals filed for this visit.  BREAST:*** No palpable  masses or nodules in either right or left breasts. No palpable axillary supraclavicular or infraclavicular adenopathy no breast tenderness or nipple discharge. (exam performed in the presence of a chaperone)  LABORATORY DATA:  I have reviewed the data as listed    Latest Ref Rng & Units 08/13/2022    9:25 AM 07/23/2022   10:12 AM 07/02/2022   10:02 AM  CMP  Glucose 70 - 99 mg/dL 91  91  101   BUN 8 - 23 mg/dL '22  18  17   '$ Creatinine 0.44 - 1.00 mg/dL 0.65  0.76  0.70   Sodium 135 - 145 mmol/L 140  137  139   Potassium 3.5 - 5.1 mmol/L 3.8  4.3  4.2   Chloride 98 - 111 mmol/L 106  103  105   CO2 22 - 32 mmol/L '28  28  28   '$ Calcium 8.9 - 10.3 mg/dL 9.5  9.8  9.4   Total Protein 6.5 - 8.1 g/dL 6.3  7.0  6.6   Total Bilirubin 0.3 - 1.2 mg/dL 0.7  0.6  0.8   Alkaline Phos 38 - 126 U/L 61  76  60   AST 15 - 41 U/L '29  22  27   '$ ALT 0 - 44 U/L '21  15  19     '$ Lab Results  Component Value Date   WBC 4.1 08/13/2022   HGB 10.1 (L) 08/13/2022   HCT 30.7 (L) 08/13/2022   MCV 90.8 08/13/2022   PLT 170 08/13/2022   NEUTROABS 2.2 08/13/2022    ASSESSMENT & PLAN:  No problem-specific Assessment & Plan notes found for this encounter.    No orders of the defined types were placed in this encounter.  The patient has a good understanding of the overall plan. she agrees with it. she will call with any problems that may develop before the next visit here. Total time spent: 30 mins including face to face time and time spent for planning, charting and co-ordination of care   Sydney Key, [place] 08/29/22    I Gardiner Coins am acting as a Education administrator for [employer]  ***

## 2022-09-03 ENCOUNTER — Inpatient Hospital Stay (HOSPITAL_BASED_OUTPATIENT_CLINIC_OR_DEPARTMENT_OTHER): Payer: Medicare Other | Admitting: Hematology and Oncology

## 2022-09-03 ENCOUNTER — Inpatient Hospital Stay: Payer: Medicare Other

## 2022-09-03 ENCOUNTER — Other Ambulatory Visit: Payer: Self-pay

## 2022-09-03 VITALS — BP 136/70 | HR 63 | Temp 97.6°F | Resp 16 | Wt 134.3 lb

## 2022-09-03 DIAGNOSIS — C50311 Malignant neoplasm of lower-inner quadrant of right female breast: Secondary | ICD-10-CM

## 2022-09-03 DIAGNOSIS — Z5112 Encounter for antineoplastic immunotherapy: Secondary | ICD-10-CM | POA: Diagnosis not present

## 2022-09-03 DIAGNOSIS — Z95828 Presence of other vascular implants and grafts: Secondary | ICD-10-CM

## 2022-09-03 DIAGNOSIS — Z171 Estrogen receptor negative status [ER-]: Secondary | ICD-10-CM

## 2022-09-03 DIAGNOSIS — Z79899 Other long term (current) drug therapy: Secondary | ICD-10-CM | POA: Diagnosis not present

## 2022-09-03 LAB — CBC WITH DIFFERENTIAL (CANCER CENTER ONLY)
Abs Immature Granulocytes: 0.01 10*3/uL (ref 0.00–0.07)
Basophils Absolute: 0 10*3/uL (ref 0.0–0.1)
Basophils Relative: 1 %
Eosinophils Absolute: 0.1 10*3/uL (ref 0.0–0.5)
Eosinophils Relative: 3 %
HCT: 33.9 % — ABNORMAL LOW (ref 36.0–46.0)
Hemoglobin: 11.1 g/dL — ABNORMAL LOW (ref 12.0–15.0)
Immature Granulocytes: 0 %
Lymphocytes Relative: 29 %
Lymphs Abs: 1.2 10*3/uL (ref 0.7–4.0)
MCH: 30.2 pg (ref 26.0–34.0)
MCHC: 32.7 g/dL (ref 30.0–36.0)
MCV: 92.4 fL (ref 80.0–100.0)
Monocytes Absolute: 0.4 10*3/uL (ref 0.1–1.0)
Monocytes Relative: 9 %
Neutro Abs: 2.4 10*3/uL (ref 1.7–7.7)
Neutrophils Relative %: 58 %
Platelet Count: 163 10*3/uL (ref 150–400)
RBC: 3.67 MIL/uL — ABNORMAL LOW (ref 3.87–5.11)
RDW: 15.2 % (ref 11.5–15.5)
WBC Count: 4.1 10*3/uL (ref 4.0–10.5)
nRBC: 0 % (ref 0.0–0.2)

## 2022-09-03 LAB — CMP (CANCER CENTER ONLY)
ALT: 20 U/L (ref 0–44)
AST: 27 U/L (ref 15–41)
Albumin: 3.9 g/dL (ref 3.5–5.0)
Alkaline Phosphatase: 62 U/L (ref 38–126)
Anion gap: 5 (ref 5–15)
BUN: 21 mg/dL (ref 8–23)
CO2: 27 mmol/L (ref 22–32)
Calcium: 9.1 mg/dL (ref 8.9–10.3)
Chloride: 106 mmol/L (ref 98–111)
Creatinine: 0.62 mg/dL (ref 0.44–1.00)
GFR, Estimated: >60 mL/min (ref >60)
Glucose, Bld: 88 mg/dL (ref 70–99)
Potassium: 3.8 mmol/L (ref 3.5–5.1)
Sodium: 138 mmol/L (ref 135–145)
Total Bilirubin: 0.9 mg/dL (ref 0.3–1.2)
Total Protein: 7 g/dL (ref 6.5–8.1)

## 2022-09-03 MED ORDER — ACETAMINOPHEN 325 MG PO TABS
650.0000 mg | ORAL_TABLET | Freq: Once | ORAL | Status: AC
  Administered 2022-09-03: 650 mg via ORAL
  Filled 2022-09-03: qty 2

## 2022-09-03 MED ORDER — SODIUM CHLORIDE 0.9% FLUSH
10.0000 mL | INTRAVENOUS | Status: DC | PRN
  Administered 2022-09-03: 10 mL

## 2022-09-03 MED ORDER — SODIUM CHLORIDE 0.9 % IV SOLN: Freq: Once | INTRAVENOUS | Status: AC

## 2022-09-03 MED ORDER — DIPHENHYDRAMINE HCL 25 MG PO CAPS
25.0000 mg | ORAL_CAPSULE | Freq: Once | ORAL | Status: AC
  Administered 2022-09-03: 25 mg via ORAL
  Filled 2022-09-03: qty 1

## 2022-09-03 MED ORDER — SODIUM CHLORIDE 0.9 % IV SOLN
3.6000 mg/kg | Freq: Once | INTRAVENOUS | Status: AC
  Administered 2022-09-03: 220 mg via INTRAVENOUS
  Filled 2022-09-03: qty 8

## 2022-09-03 MED ORDER — HEPARIN SOD (PORK) LOCK FLUSH 100 UNIT/ML IV SOLN
500.0000 [IU] | Freq: Once | INTRAVENOUS | Status: AC | PRN
  Administered 2022-09-03: 500 [IU]

## 2022-09-03 MED ORDER — PROCHLORPERAZINE MALEATE 10 MG PO TABS
10.0000 mg | ORAL_TABLET | Freq: Once | ORAL | Status: AC
  Administered 2022-09-03: 10 mg via ORAL
  Filled 2022-09-03: qty 1

## 2022-09-03 MED ORDER — SODIUM CHLORIDE 0.9% FLUSH
10.0000 mL | Freq: Once | INTRAVENOUS | Status: AC
  Administered 2022-09-03: 10 mL

## 2022-09-03 NOTE — Assessment & Plan Note (Signed)
11/28/2021:Palpable right breast lump 2.6 cm with calcifications at 3 o'clock position, additional 1.3 cm lesion is an intramammary lymph node biopsy: Benign, axilla negative, biopsy of the breast lump: Grade 3 IDC ER 0%, PR 0%, Ki-67 98%, HER2 3+ positive   Treatment plan: 1.  Neoadjuvant chemotherapy with Taxol Herceptin weekly x12 followed by Kadcyla maintenance 2. Rt Mastectomy with sentinel lymph node biopsy: 03/27/2022: 4.2 cm grade 3 IDC with negative margins 0/2 lymph nodes negative, ER 0%, PR 0%, Ki-67 80%, HER2 3+ positive 3.  +/- Adjuvant radiation ----------------------------------------------------------------------------------------------------------------- Current treatment: Kadcyla cycle 8 Kadcyla toxicities: 1.  Constipation: I encouraged her to take a stool softener regularly and Dulcolax as needed. 2. slight hair thinning 3.  Anemia: Today's hemoglobin is 7.1.  We are monitoring   Emergency room visit [place] for right hip pain: Diagnosed with osteoarthritis.  I discussed with her about an orthopedic referral but she wants to wait and watch and see if it gets better on its own.  She takes meloxicam and over-the-counter pain cream.    Radiation will start 05/07/2022 Return to clinic every 3 weeks for [facility]

## 2022-09-03 NOTE — Patient Instructions (Signed)
[facility]  Discharge Instructions: Thank you for choosing [facility] to provide your oncology and hematology care.   If you have a lab appointment with the [facility], please go directly to the [facility] and check in at the registration area.   Wear comfortable clothing and clothing appropriate for easy access to any Portacath or PICC line.   We strive to give you quality time with your provider. You may need to reschedule your appointment if you arrive late (15 or more minutes).  Arriving late affects you and other patients whose appointments are after yours.  Also, if you miss three or more appointments without notifying the office, you may be dismissed from the clinic at the provider's discretion.      For prescription refill requests, have your pharmacy contact our office and allow 72 hours for refills to be completed.    Today you received the following chemotherapy and/or immunotherapy agents ADO-Trastuzumab Emtansine   To help prevent nausea and vomiting after your treatment, we encourage you to take your nausea medication as directed.  BELOW ARE SYMPTOMS THAT SHOULD BE REPORTED IMMEDIATELY: *FEVER GREATER THAN 100.4 F (38 C) OR HIGHER *CHILLS OR SWEATING *NAUSEA AND VOMITING THAT IS NOT CONTROLLED WITH YOUR NAUSEA MEDICATION *UNUSUAL SHORTNESS OF BREATH *UNUSUAL BRUISING OR BLEEDING *URINARY PROBLEMS (pain or burning when urinating, or frequent urination) *BOWEL PROBLEMS (unusual diarrhea, constipation, pain near the anus) TENDERNESS IN MOUTH AND THROAT WITH OR WITHOUT PRESENCE OF ULCERS (sore throat, sores in mouth, or a toothache) UNUSUAL RASH, SWELLING OR PAIN  UNUSUAL VAGINAL DISCHARGE OR ITCHING   Items with * indicate a potential emergency and should be followed up as soon as possible or go to the Emergency Department if any problems should occur.  Please show the CHEMOTHERAPY ALERT CARD or IMMUNOTHERAPY ALERT  CARD at check-in to the Emergency Department and triage nurse.  Should you have questions after your visit or need to cancel or reschedule your appointment, please contact [facility]  Dept: [phone]  and follow the prompts.  Office hours are 8:00 a.m. to 4:30 p.m. Monday - Friday. Please note that voicemails left after 4:00 p.m. may not be returned until the following business day.  We are closed weekends and major holidays. You have access to a nurse at all times for urgent questions. Please call the main number to the clinic Dept: [phone] and follow the prompts.   For any non-urgent questions, you may also contact your provider using MyChart. We now offer e-Visits for anyone 28 and older to request care online for non-urgent symptoms. For details visit [email or web address].   Also download the MyChart app! Go to the app store, search "MyChart", open the app, select [facility], and log in with your MyChart username and password.

## 2022-09-11 DIAGNOSIS — E538 Deficiency of other specified B group vitamins: Secondary | ICD-10-CM | POA: Diagnosis not present

## 2022-09-17 NOTE — Progress Notes (Signed)
Patient Care Team: Raelene Bott, MD as PCP - General (Internal Medicine) Erroll Luna, MD as Consulting Physician (General Surgery) Nicholas Lose, MD as Consulting Physician (Hematology and Oncology) Eppie Gibson, MD as Attending Physician (Radiation Oncology) Janina Mayo, MD as Consulting Physician (Cardiology)  DIAGNOSIS:  Encounter Diagnosis  Name Primary?   Malignant neoplasm of lower-inner quadrant of right breast of female, estrogen receptor negative ([place]) Yes    SUMMARY OF ONCOLOGIC HISTORY: Oncology History  Malignant neoplasm of lower-inner quadrant of right breast of female, estrogen receptor negative ([place])  11/28/2021 Initial Diagnosis   Palpable right breast lump 2.6 cm with calcifications at 3 o'clock position, additional 1.3 cm lesion is an intramammary lymph node biopsy: Benign, axilla negative, biopsy of the breast lump: Grade 3 IDC ER 0%, PR 0%, Ki-67 98%, HER2 3+ positive   12/05/2021 Cancer Staging   Staging form: Breast, AJCC 8th Edition - Clinical: Stage IIA (cT2, cN0, cM0, G3, ER-, PR-, HER2+) - Signed by Nicholas Lose, MD on 12/05/2021 Stage prefix: Initial diagnosis Histologic grading system: 3 grade system   12/26/2021 - 03/12/2022 Chemotherapy   Patient is on Treatment Plan : BREAST Paclitaxel + Trastuzumab q7d / Trastuzumab q21d      Genetic Testing   [facility] CustomNext Panel was Negative. Report date is 12/18/2021.  The CustomNext-Cancer+RNAinsight panel offered by [employer] Forts includes sequencing and rearrangement analysis for the following 47 genes:  APC, ATM, AXIN2, BARD1, BMPR1A, BRCA1, BRCA2, BRIP1, CDH1, CDK4, CDKN2A, CHEK2, CTNNA1, DICER1, EPCAM, GREM1, HOXB13, KIT, MEN1, MLH1, MSH2, MSH3, MSH6, MUTYH, NBN, NF1, NTHL1, PALB2, PDGFRA, PMS2, POLD1, POLE, PTEN, RAD50, RAD51C, RAD51D, SDHA, SDHB, SDHC, SDHD, SMAD4, SMARCA4, STK11, TP53, TSC1, TSC2, and VHL.  RNA data is routinely analyzed for use in variant interpretation for all genes.    03/27/2022 Surgery   Right breast mastectomy: Invasive ductal carcinoma Nottingham grade 3 with no morphologic evidence of neoadjuvant chemotherapy effect negative for lymphovascular and perineural invasion the size noted was 4.2 x 3.7 x 3.5 cm margins were negative and 2 lymph nodes biopsied were negative. Cancer staging: ypT2 pN0 pM0   04/08/2022 -  Chemotherapy   Patient is on Treatment Plan : BREAST ADO-Trastuzumab Emtansine (Kadcyla) q21d     05/06/2022 - 06/13/2022 Radiation Therapy   Adjuvant radiation therapy     CHIEF COMPLIANT:  Follow-up Kadcyla cycle 9   INTERVAL HISTORY: Sydney Key is a 89 y.o. female is here because of recent diagnosis of right breast cancer. She presents to the clinic today for a follow-up and treatment.  She is tolerating therapy extremely well without any problems or concerns.  She does not have any complaints of fatigue.  She spent the whole day last Saturday at the flea market.   ALLERGIES:  is allergic to paclitaxel.  MEDICATIONS:  Current Outpatient Medications  Medication Sig Dispense Refill   acetaminophen (TYLENOL) 500 MG tablet Take 500 mg by mouth at bedtime.     albuterol (VENTOLIN HFA) 108 (90 Base) MCG/ACT inhaler Inhale into the lungs.     Ascorbic Acid (VITAMIN C WITH ROSE HIPS) 500 MG tablet Take 500 mg by mouth daily.     aspirin EC 81 MG tablet Take 81 mg by mouth every evening.     brimonidine (ALPHAGAN) 0.15 % ophthalmic solution Place 1 drop into the left eye 2 (two) times daily.     carvedilol (COREG) 3.125 MG tablet Take 1 tablet (3.125 mg total) by mouth 2 (two) times daily. 180 tablet  3   cholecalciferol (VITAMIN D) 1000 UNITS tablet Take 1,000 Units by mouth daily.     cyanocobalamin (,VITAMIN B-12,) 1000 MCG/ML injection Inject 1,000 mcg into the muscle every 30 (thirty) days.     escitalopram (LEXAPRO) 10 MG tablet Take 5 mg by mouth at bedtime.     gabapentin (NEURONTIN) 300 MG capsule Take 300 mg by mouth at bedtime.      meloxicam (MOBIC) 15 MG tablet Take 15 mg by mouth.     Multiple Vitamin (MULTIVITAMIN WITH MINERALS) TABS tablet Take 1 tablet by mouth daily.     No current facility-administered medications for this visit.    PHYSICAL EXAMINATION: ECOG PERFORMANCE STATUS: 1 - Symptomatic but completely ambulatory  Vitals:   09/25/22 1106  BP: (!) 141/65  Pulse: 65  Resp: 18  Temp: 97.7 F (36.5 C)  SpO2: 100%   Filed Weights   09/25/22 1106  Weight: 135 lb 4.8 oz (61.4 kg)      LABORATORY DATA:  I have reviewed the data as listed    Latest Ref Rng & Units 09/03/2022    9:55 AM 08/13/2022    9:25 AM 07/23/2022   10:12 AM  CMP  Glucose 70 - 99 mg/dL 88  91  91   BUN 8 - 23 mg/dL 21  22  18    Creatinine 0.44 - 1.00 mg/dL 0.62  0.65  0.76   Sodium 135 - 145 mmol/L 138  140  137   Potassium 3.5 - 5.1 mmol/L 3.8  3.8  4.3   Chloride 98 - 111 mmol/L 106  106  103   CO2 22 - 32 mmol/L 27  28  28    Calcium 8.9 - 10.3 mg/dL 9.1  9.5  9.8   Total Protein 6.5 - 8.1 g/dL 7.0  6.3  7.0   Total Bilirubin 0.3 - 1.2 mg/dL 0.9  0.7  0.6   Alkaline Phos 38 - 126 U/L 62  61  76   AST 15 - 41 U/L 27  29  22    ALT 0 - 44 U/L 20  21  15      Lab Results  Component Value Date   WBC 3.8 (L) 09/25/2022   HGB 11.2 (L) 09/25/2022   HCT 34.0 (L) 09/25/2022   MCV 93.2 09/25/2022   PLT 146 (L) 09/25/2022   NEUTROABS 2.0 09/25/2022    ASSESSMENT & PLAN:  Malignant neoplasm of lower-inner quadrant of right breast of female, estrogen receptor negative ([facility]) 11/28/2021:Palpable right breast lump 2.6 cm with calcifications at 3 o'clock position, additional 1.3 cm lesion is an intramammary lymph node biopsy: Benign, axilla negative, biopsy of the breast lump: Grade 3 IDC ER 0%, PR 0%, Ki-67 98%, HER2 3+ positive   Treatment plan: 1.  Neoadjuvant chemotherapy with Taxol Herceptin weekly x12 followed by Kadcyla maintenance 2. Rt Mastectomy with sentinel lymph node biopsy: 03/27/2022: 4.2 cm grade 3 IDC with  negative margins 0/2 lymph nodes negative, ER 0%, PR 0%, Ki-67 80%, HER2 3+ positive 3.  +/- Adjuvant radiation ----------------------------------------------------------------------------------------------------------------- Current treatment: Kadcyla cycle 9 Kadcyla toxicities: 1.  Constipation: I encouraged her to take a stool softener regularly and Dulcolax as needed. 2. slight hair thinning 3.  Anemia: Today's hemoglobin is 11.2.  We are monitoring   Emergency room visit [place] for right hip pain: Diagnosed with osteoarthritis.  I discussed with her about an orthopedic referral but she wants to wait and watch and see if it gets better  on its own.  She takes meloxicam and over-the-counter pain cream.    Radiation 05/07/2022 Return to clinic every 3 weeks for Kadcyla    No orders of the defined types were placed in this encounter.  The patient has a good understanding of the overall plan. she agrees with it. she will call with any problems that may develop before the next visit here. Total time spent: 30 mins including face to face time and time spent for planning, charting and co-ordination of care   Harriette Ohara, MD 09/25/22    I Gardiner Coins am acting as a Education administrator for [employer]  I have reviewed the above documentation for accuracy and completeness, and I agree with the above.

## 2022-09-24 ENCOUNTER — Ambulatory Visit: Payer: Self-pay | Admitting: Rehabilitation

## 2022-09-25 ENCOUNTER — Inpatient Hospital Stay: Payer: Medicare Other

## 2022-09-25 ENCOUNTER — Encounter: Payer: Self-pay | Admitting: *Deleted

## 2022-09-25 ENCOUNTER — Other Ambulatory Visit: Payer: Self-pay

## 2022-09-25 ENCOUNTER — Inpatient Hospital Stay: Payer: Medicare Other | Attending: Hematology and Oncology

## 2022-09-25 ENCOUNTER — Inpatient Hospital Stay (HOSPITAL_BASED_OUTPATIENT_CLINIC_OR_DEPARTMENT_OTHER): Payer: Medicare Other | Admitting: Hematology and Oncology

## 2022-09-25 ENCOUNTER — Ambulatory Visit: Payer: Medicare Other | Attending: Surgery | Admitting: Physical Therapy

## 2022-09-25 VITALS — BP 141/65 | HR 65 | Temp 97.7°F | Resp 18 | Ht 65.0 in | Wt 135.3 lb

## 2022-09-25 VITALS — BP 153/77 | HR 62

## 2022-09-25 DIAGNOSIS — Z5112 Encounter for antineoplastic immunotherapy: Secondary | ICD-10-CM | POA: Diagnosis present

## 2022-09-25 DIAGNOSIS — C50311 Malignant neoplasm of lower-inner quadrant of right female breast: Secondary | ICD-10-CM

## 2022-09-25 DIAGNOSIS — Z79899 Other long term (current) drug therapy: Secondary | ICD-10-CM | POA: Diagnosis not present

## 2022-09-25 DIAGNOSIS — Z171 Estrogen receptor negative status [ER-]: Secondary | ICD-10-CM | POA: Insufficient documentation

## 2022-09-25 DIAGNOSIS — C50211 Malignant neoplasm of upper-inner quadrant of right female breast: Secondary | ICD-10-CM | POA: Insufficient documentation

## 2022-09-25 DIAGNOSIS — Z95828 Presence of other vascular implants and grafts: Secondary | ICD-10-CM

## 2022-09-25 LAB — CBC WITH DIFFERENTIAL (CANCER CENTER ONLY)
Abs Immature Granulocytes: 0 10*3/uL (ref 0.00–0.07)
Basophils Absolute: 0 10*3/uL (ref 0.0–0.1)
Basophils Relative: 1 %
Eosinophils Absolute: 0.1 10*3/uL (ref 0.0–0.5)
Eosinophils Relative: 3 %
HCT: 34 % — ABNORMAL LOW (ref 36.0–46.0)
Hemoglobin: 11.2 g/dL — ABNORMAL LOW (ref 12.0–15.0)
Immature Granulocytes: 0 %
Lymphocytes Relative: 35 %
Lymphs Abs: 1.3 10*3/uL (ref 0.7–4.0)
MCH: 30.7 pg (ref 26.0–34.0)
MCHC: 32.9 g/dL (ref 30.0–36.0)
MCV: 93.2 fL (ref 80.0–100.0)
Monocytes Absolute: 0.4 10*3/uL (ref 0.1–1.0)
Monocytes Relative: 10 %
Neutro Abs: 2 10*3/uL (ref 1.7–7.7)
Neutrophils Relative %: 51 %
Platelet Count: 146 10*3/uL — ABNORMAL LOW (ref 150–400)
RBC: 3.65 MIL/uL — ABNORMAL LOW (ref 3.87–5.11)
RDW: 14.8 % (ref 11.5–15.5)
WBC Count: 3.8 10*3/uL — ABNORMAL LOW (ref 4.0–10.5)
nRBC: 0 % (ref 0.0–0.2)

## 2022-09-25 LAB — CMP (CANCER CENTER ONLY)
ALT: 25 U/L (ref 0–44)
AST: 33 U/L (ref 15–41)
Albumin: 3.9 g/dL (ref 3.5–5.0)
Alkaline Phosphatase: 68 U/L (ref 38–126)
Anion gap: 5 (ref 5–15)
BUN: 19 mg/dL (ref 8–23)
CO2: 29 mmol/L (ref 22–32)
Calcium: 9.6 mg/dL (ref 8.9–10.3)
Chloride: 104 mmol/L (ref 98–111)
Creatinine: 0.64 mg/dL (ref 0.44–1.00)
GFR, Estimated: >60 mL/min (ref >60)
Glucose, Bld: 95 mg/dL (ref 70–99)
Potassium: 4.1 mmol/L (ref 3.5–5.1)
Sodium: 138 mmol/L (ref 135–145)
Total Bilirubin: 0.9 mg/dL (ref 0.3–1.2)
Total Protein: 6.8 g/dL (ref 6.5–8.1)

## 2022-09-25 MED ORDER — SODIUM CHLORIDE 0.9 % IV SOLN: Freq: Once | INTRAVENOUS | Status: AC

## 2022-09-25 MED ORDER — SODIUM CHLORIDE 0.9 % IV SOLN
3.6000 mg/kg | Freq: Once | INTRAVENOUS | Status: AC
  Administered 2022-09-25: 220 mg via INTRAVENOUS
  Filled 2022-09-25: qty 8

## 2022-09-25 MED ORDER — DIPHENHYDRAMINE HCL 25 MG PO CAPS
25.0000 mg | ORAL_CAPSULE | Freq: Once | ORAL | Status: AC
  Administered 2022-09-25: 25 mg via ORAL
  Filled 2022-09-25: qty 1

## 2022-09-25 MED ORDER — HEPARIN SOD (PORK) LOCK FLUSH 100 UNIT/ML IV SOLN
500.0000 [IU] | Freq: Once | INTRAVENOUS | Status: AC | PRN
  Administered 2022-09-25: 500 [IU]

## 2022-09-25 MED ORDER — SODIUM CHLORIDE 0.9% FLUSH
10.0000 mL | Freq: Once | INTRAVENOUS | Status: AC
  Administered 2022-09-25: 10 mL

## 2022-09-25 MED ORDER — PROCHLORPERAZINE MALEATE 10 MG PO TABS
10.0000 mg | ORAL_TABLET | Freq: Once | ORAL | Status: AC
  Administered 2022-09-25: 10 mg via ORAL
  Filled 2022-09-25: qty 1

## 2022-09-25 MED ORDER — ACETAMINOPHEN 325 MG PO TABS
650.0000 mg | ORAL_TABLET | Freq: Once | ORAL | Status: AC
  Administered 2022-09-25: 650 mg via ORAL
  Filled 2022-09-25: qty 2

## 2022-09-25 MED ORDER — SODIUM CHLORIDE 0.9% FLUSH
10.0000 mL | INTRAVENOUS | Status: DC | PRN
  Administered 2022-09-25: 10 mL

## 2022-09-25 NOTE — Assessment & Plan Note (Signed)
11/28/2021:Palpable right breast lump 2.6 cm with calcifications at 3 o'clock position, additional 1.3 cm lesion is an intramammary lymph node biopsy: Benign, axilla negative, biopsy of the breast lump: Grade 3 IDC ER 0%, PR 0%, Ki-67 98%, HER2 3+ positive   Treatment plan: 1.  Neoadjuvant chemotherapy with Taxol Herceptin weekly x12 followed by Kadcyla maintenance 2. Rt Mastectomy with sentinel lymph node biopsy: 03/27/2022: 4.2 cm grade 3 IDC with negative margins 0/2 lymph nodes negative, ER 0%, PR 0%, Ki-67 80%, HER2 3+ positive 3.  +/- Adjuvant radiation ----------------------------------------------------------------------------------------------------------------- Current treatment: Kadcyla cycle 9 Kadcyla toxicities: 1.  Constipation: I encouraged her to take a stool softener regularly and Dulcolax as needed. 2. slight hair thinning 3.  Anemia: Today's hemoglobin is 11.1.  We are monitoring   Emergency room visit [place] for right hip pain: Diagnosed with osteoarthritis.  I discussed with her about an orthopedic referral but she wants to wait and watch and see if it gets better on its own.  She takes meloxicam and over-the-counter pain cream.    Radiation 05/07/2022 Return to clinic every 3 weeks for [facility]

## 2022-09-25 NOTE — Progress Notes (Signed)
Pt declined to stay for post 30 obs. Ambulatory to lobby with [facility] upon discharge. 

## 2022-09-25 NOTE — Therapy (Signed)
OUTPATIENT PHYSICAL THERAPY SOZO SCREENING NOTE   Patient Name: Sydney Key MRN: [id number] DOB:12-14-1930, 89 y.o., female Today's Date: 09/25/2022  PCP: Raelene Bott, MD REFERRING PROVIDER: Erroll Luna, MD   PT End of Session - 09/25/22 1503     Visit Number 2   unchanged due to screen only   PT Start Time 1502    PT Stop Time 1512    PT Time Calculation (min) 10 min    Activity Tolerance Patient tolerated treatment well    Behavior During Therapy [facility] for tasks assessed/performed             Past Medical History:  Diagnosis Date   Anxiety    Cancer ([place]) 2023   Right breast cancer   History of kidney stones    Past Surgical History:  Procedure Laterality Date   ABDOMINAL HYSTERECTOMY  age 50   COLONOSCOPY N/A 05/14/2013   Procedure: COLONOSCOPY;  Surgeon: Cleotis Nipper, MD;  Location: [facility];  Service: Endoscopy;  Laterality: N/A;   EYE SURGERY Left 07/09/2011   retina detachment   FOOT SURGERY Right    "shaved down" bone on Great toe   HOT HEMOSTASIS N/A 05/14/2013   Procedure: HOT HEMOSTASIS (ARGON PLASMA COAGULATION/BICAP);  Surgeon: Cleotis Nipper, MD;  Location: [facility];  Service: Endoscopy;  Laterality: N/A;   PORTACATH PLACEMENT Right 01/04/2022   Procedure: PORT PLACEMENT WITH ULTRASOUND GUIDANCE;  Surgeon: Erroll Luna, MD;  Location: [facility];  Service: General;  Laterality: Right;   SIMPLE MASTECTOMY WITH AXILLARY SENTINEL NODE BIOPSY Right 03/27/2022   Procedure: RIGHT SIMPLE MASTECTOMY WITH RIGHT SENTINEL LYMPH NODE BIOPSY;  Surgeon: Erroll Luna, MD;  Location: [place];  Service: General;  Laterality: Right;   Patient Active Problem List   Diagnosis Date Noted   Hypercholesteremia 05/20/2022   Vitamin B12 deficiency 05/20/2022   Port-A-Cath in place 01/09/2022   Genetic testing 12/21/2021   Family history of breast cancer 12/06/2021   Malignant neoplasm of lower-inner quadrant of right breast of female,  estrogen receptor negative ([place]) 12/04/2021   Resides in long term care facility 08/18/2020   Closed subcapital fracture of neck of femur, left, initial encounter ([place]) 06/06/2020   SBO (small bowel obstruction) ([place]) 04/08/2020   Chronic right shoulder pain 01/31/2020   Glaucoma of both eyes 01/31/2020   Restless legs syndrome 01/25/2019   Skin lesion of left leg 01/25/2019   Bilateral hearing loss 10/02/2015   Osteopenia after menopause 06/08/2014   GERD (gastroesophageal reflux disease) 01/25/2013   Chronic low back pain 01/24/2013   Colon polyps 01/24/2013    REFERRING DIAG: right breast cancer at risk for lymphedema  THERAPY DIAG:  Malignant neoplasm of upper-inner quadrant of right breast in female, estrogen receptor negative ([place])  PERTINENT HISTORY: Patient was diagnosed on 11/19/2021 with right 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 It measures 2.6 cm and is located in the upper inner quadrant. It is ER/PR negative and HER2 positive with a Ki67 of 98%. She had a left hip fracture in 05/2020 which required surgery and had a right foot bunion surgery in 08/2021.   PRECAUTIONS: right UE Lymphedema risk,   SUBJECTIVE: Here for 3 month SOZO screen.   PAIN:  Are you having pain? No  SOZO SCREENING: Patient was assessed today using the SOZO machine to determine the lymphedema index score. This was compared to her baseline score. It was determined that she is within the recommended range when compared to  her baseline and no further action is needed at this time. She will continue SOZO screenings. These are done every 3 months for 2 years post operatively followed by every 6 months for 2 years, and then annually.    [address] [place], PT 09/25/2022, 3:13 PM

## 2022-09-25 NOTE — Patient Instructions (Signed)
[facility]  Discharge Instructions: Thank you for choosing [facility] to provide your oncology and hematology care.   If you have a lab appointment with the Cancer Center, please go directly to the Cancer Center and check in at the registration area.   Wear comfortable clothing and clothing appropriate for easy access to any Portacath or PICC line.   We strive to give you quality time with your provider. You may need to reschedule your appointment if you arrive late (15 or more minutes).  Arriving late affects you and other patients whose appointments are after yours.  Also, if you miss three or more appointments without notifying the office, you may be dismissed from the clinic at the provider's discretion.      For prescription refill requests, have your pharmacy contact our office and allow 72 hours for refills to be completed.    Today you received the following chemotherapy and/or immunotherapy agents: Kadcyla.       To help prevent nausea and vomiting after your treatment, we encourage you to take your nausea medication as directed.  BELOW ARE SYMPTOMS THAT SHOULD BE REPORTED IMMEDIATELY: *FEVER GREATER THAN 100.4 F (38 C) OR HIGHER *CHILLS OR SWEATING *NAUSEA AND VOMITING THAT IS NOT CONTROLLED WITH YOUR NAUSEA MEDICATION *UNUSUAL SHORTNESS OF BREATH *UNUSUAL BRUISING OR BLEEDING *URINARY PROBLEMS (pain or burning when urinating, or frequent urination) *BOWEL PROBLEMS (unusual diarrhea, constipation, pain near the anus) TENDERNESS IN MOUTH AND THROAT WITH OR WITHOUT PRESENCE OF ULCERS (sore throat, sores in mouth, or a toothache) UNUSUAL RASH, SWELLING OR PAIN  UNUSUAL VAGINAL DISCHARGE OR ITCHING   Items with * indicate a potential emergency and should be followed up as soon as possible or go to the Emergency Department if any problems should occur.  Please show the CHEMOTHERAPY ALERT CARD or IMMUNOTHERAPY ALERT CARD at  check-in to the Emergency Department and triage nurse.  Should you have questions after your visit or need to cancel or reschedule your appointment, please contact [facility]  Dept: [phone]  and follow the prompts.  Office hours are 8:00 a.m. to 4:30 p.m. Monday - Friday. Please note that voicemails left after 4:00 p.m. may not be returned until the following business day.  We are closed weekends and major holidays. You have access to a nurse at all times for urgent questions. Please call the main number to the clinic Dept: [phone] and follow the prompts.   For any non-urgent questions, you may also contact your provider using MyChart. We now offer e-Visits for anyone 18 and older to request care online for non-urgent symptoms. For details visit [email or web address].   Also download the MyChart app! Go to the app store, search "MyChart", open the app, select [place], and log in with your MyChart username and password.   

## 2022-10-08 ENCOUNTER — Other Ambulatory Visit: Payer: Self-pay

## 2022-10-14 ENCOUNTER — Inpatient Hospital Stay: Payer: Medicare Other | Attending: Hematology and Oncology

## 2022-10-14 ENCOUNTER — Other Ambulatory Visit: Payer: Self-pay

## 2022-10-14 DIAGNOSIS — C50311 Malignant neoplasm of lower-inner quadrant of right female breast: Secondary | ICD-10-CM | POA: Insufficient documentation

## 2022-10-14 DIAGNOSIS — Z79899 Other long term (current) drug therapy: Secondary | ICD-10-CM | POA: Insufficient documentation

## 2022-10-14 DIAGNOSIS — Z5112 Encounter for antineoplastic immunotherapy: Secondary | ICD-10-CM | POA: Insufficient documentation

## 2022-10-14 DIAGNOSIS — E538 Deficiency of other specified B group vitamins: Secondary | ICD-10-CM | POA: Diagnosis not present

## 2022-10-14 NOTE — Progress Notes (Signed)
[facility] Healthcare Advance Directives Clinical Social Work  Patient presented to Advance Directives Clinic  to review and complete healthcare advance directives.  Clinical Social Worker met with patient and her daughter.  The patient designated Ronna Polio Hurt as their primary healthcare agent and Ileana Ladd as their secondary agent.  Patient also completed healthcare living will.    Documents were notarized and copies made for patient/family. Clinical Social Worker will send documents to medical records to be scanned into patient's chart. Clinical Social Worker encouraged patient/family to contact with any additional questions or concerns.   Dorothey Baseman, LCSW Clinical Social Worker [facility]

## 2022-10-14 NOTE — Progress Notes (Signed)
Patient Care Team: Lindwood Qua, MD as PCP - General (Internal Medicine) Harriette Bouillon, MD as Consulting Physician (General Surgery) Serena Croissant, MD as Consulting Physician (Hematology and Oncology) Lonie Peak, MD as Attending Physician (Radiation Oncology) Maisie Fus, MD as Consulting Physician (Cardiology)  DIAGNOSIS: No diagnosis found.  SUMMARY OF ONCOLOGIC HISTORY: Oncology History  Malignant neoplasm of lower-inner quadrant of right breast of female, estrogen receptor negative  11/28/2021 Initial Diagnosis   Palpable right breast lump 2.6 cm with calcifications at 3 o'clock position, additional 1.3 cm lesion is an intramammary lymph node biopsy: Benign, axilla negative, biopsy of the breast lump: Grade 3 IDC ER 0%, PR 0%, Ki-67 98%, HER2 3+ positive   12/05/2021 Cancer Staging   Staging form: Breast, AJCC 8th Edition - Clinical: Stage IIA (cT2, cN0, cM0, G3, ER-, PR-, HER2+) - Signed by Serena Croissant, MD on 12/05/2021 Stage prefix: Initial diagnosis Histologic grading system: 3 grade system   12/26/2021 - 03/12/2022 Chemotherapy   Patient is on Treatment Plan : BREAST Paclitaxel + Trastuzumab q7d / Trastuzumab q21d      Genetic Testing   [facility] CustomNext Panel was Negative. Report date is 12/18/2021.  The CustomNext-Cancer+RNAinsight panel offered by [employer] includes sequencing and rearrangement analysis for the following 47 genes:  APC, ATM, AXIN2, BARD1, BMPR1A, BRCA1, BRCA2, BRIP1, CDH1, CDK4, CDKN2A, CHEK2, CTNNA1, DICER1, EPCAM, GREM1, HOXB13, KIT, MEN1, MLH1, MSH2, MSH3, MSH6, MUTYH, NBN, NF1, NTHL1, PALB2, PDGFRA, PMS2, POLD1, POLE, PTEN, RAD50, RAD51C, RAD51D, SDHA, SDHB, SDHC, SDHD, SMAD4, SMARCA4, STK11, TP53, TSC1, TSC2, and VHL.  RNA data is routinely analyzed for use in variant interpretation for all genes.   03/27/2022 Surgery   Right breast mastectomy: Invasive ductal carcinoma Nottingham grade 3 with no morphologic evidence of neoadjuvant  chemotherapy effect negative for lymphovascular and perineural invasion the size noted was 4.2 x 3.7 x 3.5 cm margins were negative and 2 lymph nodes biopsied were negative. Cancer staging: ypT2 pN0 pM0   04/08/2022 -  Chemotherapy   Patient is on Treatment Plan : BREAST ADO-Trastuzumab Emtansine (Kadcyla) q21d     05/06/2022 - 06/13/2022 Radiation Therapy   Adjuvant radiation therapy     CHIEF COMPLIANT:   INTERVAL HISTORY: Sydney Key is a   ALLERGIES:  is allergic to paclitaxel.  MEDICATIONS:  Current Outpatient Medications  Medication Sig Dispense Refill   acetaminophen (TYLENOL) 500 MG tablet Take 500 mg by mouth at bedtime.     albuterol (VENTOLIN HFA) 108 (90 Base) MCG/ACT inhaler Inhale into the lungs.     Ascorbic Acid (VITAMIN C WITH ROSE HIPS) 500 MG tablet Take 500 mg by mouth daily.     aspirin EC 81 MG tablet Take 81 mg by mouth every evening.     brimonidine (ALPHAGAN) 0.15 % ophthalmic solution Place 1 drop into the left eye 2 (two) times daily.     carvedilol (COREG) 3.125 MG tablet Take 1 tablet (3.125 mg total) by mouth 2 (two) times daily. 180 tablet 3   cholecalciferol (VITAMIN D) 1000 UNITS tablet Take 1,000 Units by mouth daily.     cyanocobalamin (,VITAMIN B-12,) 1000 MCG/ML injection Inject 1,000 mcg into the muscle every 30 (thirty) days.     escitalopram (LEXAPRO) 10 MG tablet Take 5 mg by mouth at bedtime.     gabapentin (NEURONTIN) 300 MG capsule Take 300 mg by mouth at bedtime.     Multiple Vitamin (MULTIVITAMIN WITH MINERALS) TABS tablet Take 1 tablet by mouth daily.  No current facility-administered medications for this visit.    PHYSICAL EXAMINATION: ECOG PERFORMANCE STATUS: {CHL ONC ECOG PS:[id number]}  There were no vitals filed for this visit. There were no vitals filed for this visit.  BREAST:*** No palpable masses or nodules in either right or left breasts. No palpable axillary supraclavicular or infraclavicular adenopathy no breast  tenderness or nipple discharge. (exam performed in the presence of a chaperone)  LABORATORY DATA:  I have reviewed the data as listed    Latest Ref Rng & Units 09/25/2022   10:49 AM 09/03/2022    9:55 AM 08/13/2022    9:25 AM  CMP  Glucose 70 - 99 mg/dL 95  88  91   BUN 8 - 23 mg/dL 19  21  22    Creatinine 0.44 - 1.00 mg/dL 0.34  9.17  9.15   Sodium 135 - 145 mmol/L 138  138  140   Potassium 3.5 - 5.1 mmol/L 4.1  3.8  3.8   Chloride 98 - 111 mmol/L 104  106  106   CO2 22 - 32 mmol/L 29  27  28    Calcium 8.9 - 10.3 mg/dL 9.6  9.1  9.5   Total Protein 6.5 - 8.1 g/dL 6.8  7.0  6.3   Total Bilirubin 0.3 - 1.2 mg/dL 0.9  0.9  0.7   Alkaline Phos 38 - 126 U/L 68  62  61   AST 15 - 41 U/L 33  27  29   ALT 0 - 44 U/L 25  20  21      Lab Results  Component Value Date   WBC 3.8 (L) 09/25/2022   HGB 11.2 (L) 09/25/2022   HCT 34.0 (L) 09/25/2022   MCV 93.2 09/25/2022   PLT 146 (L) 09/25/2022   NEUTROABS 2.0 09/25/2022    ASSESSMENT & PLAN:  No problem-specific Assessment & Plan notes found for this encounter.    No orders of the defined types were placed in this encounter.  The patient has a good understanding of the overall plan. she agrees with it. she will call with any problems that may develop before the next visit here. Total time spent: 30 mins including face to face time and time spent for planning, charting and co-ordination of care   Sherlyn Lick, CMA 10/14/22    I Janan Ridge am acting as a Neurosurgeon for [employer]  ***

## 2022-10-15 ENCOUNTER — Other Ambulatory Visit: Payer: Self-pay

## 2022-10-15 ENCOUNTER — Inpatient Hospital Stay (HOSPITAL_BASED_OUTPATIENT_CLINIC_OR_DEPARTMENT_OTHER): Payer: Medicare Other | Admitting: Hematology and Oncology

## 2022-10-15 ENCOUNTER — Inpatient Hospital Stay: Payer: Medicare Other

## 2022-10-15 VITALS — BP 144/66 | HR 60 | Resp 17

## 2022-10-15 VITALS — BP 123/58 | HR 65 | Temp 97.6°F | Resp 18 | Ht 65.0 in | Wt 135.4 lb

## 2022-10-15 DIAGNOSIS — Z171 Estrogen receptor negative status [ER-]: Secondary | ICD-10-CM

## 2022-10-15 DIAGNOSIS — Z79899 Other long term (current) drug therapy: Secondary | ICD-10-CM | POA: Diagnosis not present

## 2022-10-15 DIAGNOSIS — Z5112 Encounter for antineoplastic immunotherapy: Secondary | ICD-10-CM | POA: Diagnosis present

## 2022-10-15 DIAGNOSIS — C50311 Malignant neoplasm of lower-inner quadrant of right female breast: Secondary | ICD-10-CM | POA: Diagnosis not present

## 2022-10-15 DIAGNOSIS — Z95828 Presence of other vascular implants and grafts: Secondary | ICD-10-CM

## 2022-10-15 LAB — CMP (CANCER CENTER ONLY)
ALT: 25 U/L (ref 0–44)
AST: 32 U/L (ref 15–41)
Albumin: 3.8 g/dL (ref 3.5–5.0)
Alkaline Phosphatase: 69 U/L (ref 38–126)
Anion gap: 4 — ABNORMAL LOW (ref 5–15)
BUN: 20 mg/dL (ref 8–23)
CO2: 30 mmol/L (ref 22–32)
Calcium: 9.4 mg/dL (ref 8.9–10.3)
Chloride: 105 mmol/L (ref 98–111)
Creatinine: 0.68 mg/dL (ref 0.44–1.00)
GFR, Estimated: >60 mL/min (ref >60)
Glucose, Bld: 98 mg/dL (ref 70–99)
Potassium: 4.2 mmol/L (ref 3.5–5.1)
Sodium: 139 mmol/L (ref 135–145)
Total Bilirubin: 0.9 mg/dL (ref 0.3–1.2)
Total Protein: 6.8 g/dL (ref 6.5–8.1)

## 2022-10-15 LAB — CBC WITH DIFFERENTIAL (CANCER CENTER ONLY)
Abs Immature Granulocytes: 0.01 10*3/uL (ref 0.00–0.07)
Basophils Absolute: 0 10*3/uL (ref 0.0–0.1)
Basophils Relative: 1 %
Eosinophils Absolute: 0.1 10*3/uL (ref 0.0–0.5)
Eosinophils Relative: 2 %
HCT: 34.9 % — ABNORMAL LOW (ref 36.0–46.0)
Hemoglobin: 11.2 g/dL — ABNORMAL LOW (ref 12.0–15.0)
Immature Granulocytes: 0 %
Lymphocytes Relative: 30 %
Lymphs Abs: 1.3 10*3/uL (ref 0.7–4.0)
MCH: 30.1 pg (ref 26.0–34.0)
MCHC: 32.1 g/dL (ref 30.0–36.0)
MCV: 93.8 fL (ref 80.0–100.0)
Monocytes Absolute: 0.4 10*3/uL (ref 0.1–1.0)
Monocytes Relative: 9 %
Neutro Abs: 2.5 10*3/uL (ref 1.7–7.7)
Neutrophils Relative %: 58 %
Platelet Count: 127 10*3/uL — ABNORMAL LOW (ref 150–400)
RBC: 3.72 MIL/uL — ABNORMAL LOW (ref 3.87–5.11)
RDW: 14.4 % (ref 11.5–15.5)
WBC Count: 4.2 10*3/uL (ref 4.0–10.5)
nRBC: 0 % (ref 0.0–0.2)

## 2022-10-15 MED ORDER — DIPHENHYDRAMINE HCL 25 MG PO CAPS
25.0000 mg | ORAL_CAPSULE | Freq: Once | ORAL | Status: AC
  Administered 2022-10-15: 25 mg via ORAL
  Filled 2022-10-15: qty 1

## 2022-10-15 MED ORDER — ACETAMINOPHEN 325 MG PO TABS
650.0000 mg | ORAL_TABLET | Freq: Once | ORAL | Status: AC
  Administered 2022-10-15: 650 mg via ORAL
  Filled 2022-10-15: qty 2

## 2022-10-15 MED ORDER — PROCHLORPERAZINE MALEATE 10 MG PO TABS
10.0000 mg | ORAL_TABLET | Freq: Once | ORAL | Status: AC
  Administered 2022-10-15: 10 mg via ORAL
  Filled 2022-10-15: qty 1

## 2022-10-15 MED ORDER — HEPARIN SOD (PORK) LOCK FLUSH 100 UNIT/ML IV SOLN
500.0000 [IU] | Freq: Once | INTRAVENOUS | Status: AC | PRN
  Administered 2022-10-15: 500 [IU]

## 2022-10-15 MED ORDER — SODIUM CHLORIDE 0.9% FLUSH
10.0000 mL | INTRAVENOUS | Status: DC | PRN
  Administered 2022-10-15: 10 mL

## 2022-10-15 MED ORDER — SODIUM CHLORIDE 0.9 % IV SOLN
3.6000 mg/kg | Freq: Once | INTRAVENOUS | Status: AC
  Administered 2022-10-15: 220 mg via INTRAVENOUS
  Filled 2022-10-15: qty 8

## 2022-10-15 MED ORDER — SODIUM CHLORIDE 0.9% FLUSH
10.0000 mL | Freq: Once | INTRAVENOUS | Status: AC
  Administered 2022-10-15: 10 mL

## 2022-10-15 MED ORDER — SODIUM CHLORIDE 0.9 % IV SOLN: Freq: Once | INTRAVENOUS | Status: AC

## 2022-10-15 NOTE — Progress Notes (Signed)
Pt declined to stay for post 30 min obs, discharged with VSS, ambulatory to lobby 

## 2022-10-15 NOTE — Assessment & Plan Note (Signed)
11/28/2021:Palpable right breast lump 2.6 cm with calcifications at 3 o'clock position, additional 1.3 cm lesion is an intramammary lymph node biopsy: Benign, axilla negative, biopsy of the breast lump: Grade 3 IDC ER 0%, PR 0%, Ki-67 98%, HER2 3+ positive   Treatment plan: 1.  Neoadjuvant chemotherapy with Taxol Herceptin weekly x12 followed by Kadcyla maintenance 2. Rt Mastectomy with sentinel lymph node biopsy: 03/27/2022: 4.2 cm grade 3 IDC with negative margins 0/2 lymph nodes negative, ER 0%, PR 0%, Ki-67 80%, HER2 3+ positive 3.  +/- Adjuvant radiation ----------------------------------------------------------------------------------------------------------------- Current treatment: Kadcyla cycle 10 Kadcyla toxicities: 1.  Constipation: I encouraged her to take a stool softener regularly and Dulcolax as needed. 2. slight hair thinning 3.  Anemia: Today's hemoglobin is 11.2.  We are monitoring   Emergency room visit [place] for right hip pain: Diagnosed with osteoarthritis. She takes meloxicam and over-the-counter pain cream.    Radiation 05/07/2022 Return to clinic every 3 weeks for [facility]

## 2022-10-15 NOTE — Patient Instructions (Signed)
[facility]  Discharge Instructions: Thank you for choosing [facility] to provide your oncology and hematology care.   If you have a lab appointment with the Cancer Center, please go directly to the Cancer Center and check in at the registration area.   Wear comfortable clothing and clothing appropriate for easy access to any Portacath or PICC line.   We strive to give you quality time with your provider. You may need to reschedule your appointment if you arrive late (15 or more minutes).  Arriving late affects you and other patients whose appointments are after yours.  Also, if you miss three or more appointments without notifying the office, you may be dismissed from the clinic at the provider's discretion.      For prescription refill requests, have your pharmacy contact our office and allow 72 hours for refills to be completed.    Today you received the following chemotherapy and/or immunotherapy agents: Kadcyla.       To help prevent nausea and vomiting after your treatment, we encourage you to take your nausea medication as directed.  BELOW ARE SYMPTOMS THAT SHOULD BE REPORTED IMMEDIATELY: *FEVER GREATER THAN 100.4 F (38 C) OR HIGHER *CHILLS OR SWEATING *NAUSEA AND VOMITING THAT IS NOT CONTROLLED WITH YOUR NAUSEA MEDICATION *UNUSUAL SHORTNESS OF BREATH *UNUSUAL BRUISING OR BLEEDING *URINARY PROBLEMS (pain or burning when urinating, or frequent urination) *BOWEL PROBLEMS (unusual diarrhea, constipation, pain near the anus) TENDERNESS IN MOUTH AND THROAT WITH OR WITHOUT PRESENCE OF ULCERS (sore throat, sores in mouth, or a toothache) UNUSUAL RASH, SWELLING OR PAIN  UNUSUAL VAGINAL DISCHARGE OR ITCHING   Items with * indicate a potential emergency and should be followed up as soon as possible or go to the Emergency Department if any problems should occur.  Please show the CHEMOTHERAPY ALERT CARD or IMMUNOTHERAPY ALERT CARD at  check-in to the Emergency Department and triage nurse.  Should you have questions after your visit or need to cancel or reschedule your appointment, please contact [facility]  Dept: [phone]  and follow the prompts.  Office hours are 8:00 a.m. to 4:30 p.m. Monday - Friday. Please note that voicemails left after 4:00 p.m. may not be returned until the following business day.  We are closed weekends and major holidays. You have access to a nurse at all times for urgent questions. Please call the main number to the clinic Dept: [phone] and follow the prompts.   For any non-urgent questions, you may also contact your provider using MyChart. We now offer e-Visits for anyone 18 and older to request care online for non-urgent symptoms. For details visit [email or web address].   Also download the MyChart app! Go to the app store, search "MyChart", open the app, select [place], and log in with your MyChart username and password.   

## 2022-10-16 DIAGNOSIS — H401123 Primary open-angle glaucoma, left eye, severe stage: Secondary | ICD-10-CM | POA: Diagnosis not present

## 2022-11-04 DIAGNOSIS — L570 Actinic keratosis: Secondary | ICD-10-CM | POA: Diagnosis not present

## 2022-11-04 DIAGNOSIS — L905 Scar conditions and fibrosis of skin: Secondary | ICD-10-CM | POA: Diagnosis not present

## 2022-11-04 DIAGNOSIS — D492 Neoplasm of unspecified behavior of bone, soft tissue, and skin: Secondary | ICD-10-CM | POA: Diagnosis not present

## 2022-11-05 ENCOUNTER — Encounter: Payer: Self-pay | Admitting: *Deleted

## 2022-11-05 ENCOUNTER — Other Ambulatory Visit: Payer: Self-pay

## 2022-11-05 ENCOUNTER — Inpatient Hospital Stay: Payer: Medicare Other

## 2022-11-05 ENCOUNTER — Other Ambulatory Visit: Payer: Self-pay | Admitting: *Deleted

## 2022-11-05 ENCOUNTER — Inpatient Hospital Stay (HOSPITAL_BASED_OUTPATIENT_CLINIC_OR_DEPARTMENT_OTHER): Payer: Medicare Other | Admitting: Hematology and Oncology

## 2022-11-05 VITALS — BP 143/68 | HR 62 | Temp 97.2°F | Resp 18 | Ht 65.0 in | Wt 132.6 lb

## 2022-11-05 VITALS — BP 129/55 | HR 62 | Resp 16

## 2022-11-05 DIAGNOSIS — C50311 Malignant neoplasm of lower-inner quadrant of right female breast: Secondary | ICD-10-CM

## 2022-11-05 DIAGNOSIS — Z171 Estrogen receptor negative status [ER-]: Secondary | ICD-10-CM

## 2022-11-05 DIAGNOSIS — Z95828 Presence of other vascular implants and grafts: Secondary | ICD-10-CM

## 2022-11-05 LAB — CMP (CANCER CENTER ONLY)
ALT: 26 U/L (ref 0–44)
AST: 35 U/L (ref 15–41)
Albumin: 3.8 g/dL (ref 3.5–5.0)
Alkaline Phosphatase: 69 U/L (ref 38–126)
Anion gap: 6 (ref 5–15)
BUN: 20 mg/dL (ref 8–23)
CO2: 28 mmol/L (ref 22–32)
Calcium: 9.7 mg/dL (ref 8.9–10.3)
Chloride: 105 mmol/L (ref 98–111)
Creatinine: 0.67 mg/dL (ref 0.44–1.00)
GFR, Estimated: >60 mL/min (ref >60)
Glucose, Bld: 94 mg/dL (ref 70–99)
Potassium: 3.8 mmol/L (ref 3.5–5.1)
Sodium: 139 mmol/L (ref 135–145)
Total Bilirubin: 1 mg/dL (ref 0.3–1.2)
Total Protein: 6.7 g/dL (ref 6.5–8.1)

## 2022-11-05 LAB — CBC WITH DIFFERENTIAL (CANCER CENTER ONLY)
Abs Immature Granulocytes: 0.01 10*3/uL (ref 0.00–0.07)
Basophils Absolute: 0 10*3/uL (ref 0.0–0.1)
Basophils Relative: 1 %
Eosinophils Absolute: 0.1 10*3/uL (ref 0.0–0.5)
Eosinophils Relative: 3 %
HCT: 35.4 % — ABNORMAL LOW (ref 36.0–46.0)
Hemoglobin: 11.4 g/dL — ABNORMAL LOW (ref 12.0–15.0)
Immature Granulocytes: 0 %
Lymphocytes Relative: 34 %
Lymphs Abs: 1.2 10*3/uL (ref 0.7–4.0)
MCH: 30.3 pg (ref 26.0–34.0)
MCHC: 32.2 g/dL (ref 30.0–36.0)
MCV: 94.1 fL (ref 80.0–100.0)
Monocytes Absolute: 0.3 10*3/uL (ref 0.1–1.0)
Monocytes Relative: 9 %
Neutro Abs: 2 10*3/uL (ref 1.7–7.7)
Neutrophils Relative %: 53 %
Platelet Count: 123 10*3/uL — ABNORMAL LOW (ref 150–400)
RBC: 3.76 MIL/uL — ABNORMAL LOW (ref 3.87–5.11)
RDW: 14 % (ref 11.5–15.5)
WBC Count: 3.7 10*3/uL — ABNORMAL LOW (ref 4.0–10.5)
nRBC: 0 % (ref 0.0–0.2)

## 2022-11-05 MED ORDER — SODIUM CHLORIDE 0.9 % IV SOLN
3.6000 mg/kg | Freq: Once | INTRAVENOUS | Status: AC
  Administered 2022-11-05: 220 mg via INTRAVENOUS
  Filled 2022-11-05: qty 8

## 2022-11-05 MED ORDER — SODIUM CHLORIDE 0.9% FLUSH
10.0000 mL | INTRAVENOUS | Status: DC | PRN
  Administered 2022-11-05: 10 mL

## 2022-11-05 MED ORDER — ACETAMINOPHEN 325 MG PO TABS
650.0000 mg | ORAL_TABLET | Freq: Once | ORAL | Status: AC
  Administered 2022-11-05: 650 mg via ORAL
  Filled 2022-11-05: qty 2

## 2022-11-05 MED ORDER — DIPHENHYDRAMINE HCL 25 MG PO CAPS
25.0000 mg | ORAL_CAPSULE | Freq: Once | ORAL | Status: AC
  Administered 2022-11-05: 25 mg via ORAL
  Filled 2022-11-05: qty 1

## 2022-11-05 MED ORDER — PROCHLORPERAZINE MALEATE 10 MG PO TABS
10.0000 mg | ORAL_TABLET | Freq: Once | ORAL | Status: AC
  Administered 2022-11-05: 10 mg via ORAL
  Filled 2022-11-05: qty 1

## 2022-11-05 MED ORDER — SODIUM CHLORIDE 0.9 % IV SOLN: Freq: Once | INTRAVENOUS | Status: AC

## 2022-11-05 MED ORDER — SODIUM CHLORIDE 0.9% FLUSH
10.0000 mL | Freq: Once | INTRAVENOUS | Status: AC
  Administered 2022-11-05: 10 mL

## 2022-11-05 MED ORDER — HEPARIN SOD (PORK) LOCK FLUSH 100 UNIT/ML IV SOLN
500.0000 [IU] | Freq: Once | INTRAVENOUS | Status: AC | PRN
  Administered 2022-11-05: 500 [IU]

## 2022-11-05 NOTE — Progress Notes (Signed)
Patient Care Team: Lindwood Qua, MD as PCP - General (Internal Medicine) Harriette Bouillon, MD as Consulting Physician (General Surgery) Serena Croissant, MD as Consulting Physician (Hematology and Oncology) Lonie Peak, MD as Attending Physician (Radiation Oncology) Maisie Fus, MD as Consulting Physician (Cardiology)  DIAGNOSIS:  Encounter Diagnosis  Name Primary?   Malignant neoplasm of lower-inner quadrant of right breast of female, estrogen receptor negative (HCC) Yes    SUMMARY OF ONCOLOGIC HISTORY: Oncology History  Malignant neoplasm of lower-inner quadrant of right breast of female, estrogen receptor negative ([facility])  11/28/2021 Initial Diagnosis   Palpable right breast lump 2.6 cm with calcifications at 3 o'clock position, additional 1.3 cm lesion is an intramammary lymph node biopsy: Benign, axilla negative, biopsy of the breast lump: Grade 3 IDC ER 0%, PR 0%, Ki-67 98%, HER2 3+ positive   12/05/2021 Cancer Staging   Staging form: Breast, AJCC 8th Edition - Clinical: Stage IIA (cT2, cN0, cM0, G3, ER-, PR-, HER2+) - Signed by Serena Croissant, MD on 12/05/2021 Stage prefix: Initial diagnosis Histologic grading system: 3 grade system   12/26/2021 - 03/12/2022 Chemotherapy   Patient is on Treatment Plan : BREAST Paclitaxel + Trastuzumab q7d / Trastuzumab q21d      Genetic Testing   [facility] CustomNext Panel was Negative. Report date is 12/18/2021.  The CustomNext-Cancer+RNAinsight panel offered by [employer] includes sequencing and rearrangement analysis for the following 47 genes:  APC, ATM, AXIN2, BARD1, BMPR1A, BRCA1, BRCA2, BRIP1, CDH1, CDK4, CDKN2A, CHEK2, CTNNA1, DICER1, EPCAM, GREM1, HOXB13, KIT, MEN1, MLH1, MSH2, MSH3, MSH6, MUTYH, NBN, NF1, NTHL1, PALB2, PDGFRA, PMS2, POLD1, POLE, PTEN, RAD50, RAD51C, RAD51D, SDHA, SDHB, SDHC, SDHD, SMAD4, SMARCA4, STK11, TP53, TSC1, TSC2, and VHL.  RNA data is routinely analyzed for use in variant interpretation for all genes.    03/27/2022 Surgery   Right breast mastectomy: Invasive ductal carcinoma Nottingham grade 3 with no morphologic evidence of neoadjuvant chemotherapy effect negative for lymphovascular and perineural invasion the size noted was 4.2 x 3.7 x 3.5 cm margins were negative and 2 lymph nodes biopsied were negative. Cancer staging: ypT2 pN0 pM0   04/08/2022 -  Chemotherapy   Patient is on Treatment Plan : BREAST ADO-Trastuzumab Emtansine (Kadcyla) q21d     05/06/2022 - 06/13/2022 Radiation Therapy   Adjuvant radiation therapy     CHIEF COMPLIANT:  Follow-up Kadcyla cycle 11  INTERVAL HISTORY: Sydney Key is a  89 y.o. female is here because of recent diagnosis of right breast cancer. She presents to the clinic today for a follow-up. She reports no new issues or concerns.   ALLERGIES:  is allergic to paclitaxel.  MEDICATIONS:  Current Outpatient Medications  Medication Sig Dispense Refill   acetaminophen (TYLENOL) 500 MG tablet Take 500 mg by mouth at bedtime.     albuterol (VENTOLIN HFA) 108 (90 Base) MCG/ACT inhaler Inhale into the lungs.     Ascorbic Acid (VITAMIN C WITH ROSE HIPS) 500 MG tablet Take 500 mg by mouth daily.     aspirin EC 81 MG tablet Take 81 mg by mouth every evening.     brimonidine (ALPHAGAN) 0.15 % ophthalmic solution Place 1 drop into the left eye 2 (two) times daily.     carvedilol (COREG) 3.125 MG tablet Take 1 tablet (3.125 mg total) by mouth 2 (two) times daily. 180 tablet 3   cholecalciferol (VITAMIN D) 1000 UNITS tablet Take 1,000 Units by mouth daily.     cyanocobalamin (,VITAMIN B-12,) 1000 MCG/ML injection Inject 1,000 mcg  into the muscle every 30 (thirty) days.     escitalopram (LEXAPRO) 10 MG tablet Take 5 mg by mouth at bedtime.     gabapentin (NEURONTIN) 300 MG capsule Take 300 mg by mouth at bedtime.     Multiple Vitamin (MULTIVITAMIN WITH MINERALS) TABS tablet Take 1 tablet by mouth daily.     No current facility-administered medications for this visit.     PHYSICAL EXAMINATION: ECOG PERFORMANCE STATUS: 1 - Symptomatic but completely ambulatory  Vitals:   11/05/22 1025  BP: (!) 143/68  Pulse: 62  Resp: 18  Temp: (!) 97.2 F (36.2 C)  SpO2: 98%   Filed Weights   11/05/22 1025  Weight: 132 lb 9.6 oz (60.1 kg)      LABORATORY DATA:  I have reviewed the data as listed    Latest Ref Rng & Units 10/15/2022   10:01 AM 09/25/2022   10:49 AM 09/03/2022    9:55 AM  CMP  Glucose 70 - 99 mg/dL 98  95  88   BUN 8 - 23 mg/dL 20  19  21    Creatinine 0.44 - 1.00 mg/dL 1.61  0.96  0.45   Sodium 135 - 145 mmol/L 139  138  138   Potassium 3.5 - 5.1 mmol/L 4.2  4.1  3.8   Chloride 98 - 111 mmol/L 105  104  106   CO2 22 - 32 mmol/L 30  29  27    Calcium 8.9 - 10.3 mg/dL 9.4  9.6  9.1   Total Protein 6.5 - 8.1 g/dL 6.8  6.8  7.0   Total Bilirubin 0.3 - 1.2 mg/dL 0.9  0.9  0.9   Alkaline Phos 38 - 126 U/L 69  68  62   AST 15 - 41 U/L 32  33  27   ALT 0 - 44 U/L 25  25  20      Lab Results  Component Value Date   WBC 3.7 (L) 11/05/2022   HGB 11.4 (L) 11/05/2022   HCT 35.4 (L) 11/05/2022   MCV 94.1 11/05/2022   PLT 123 (L) 11/05/2022   NEUTROABS 2.0 11/05/2022    ASSESSMENT & PLAN:  Malignant neoplasm of lower-inner quadrant of right breast of female, estrogen receptor negative (HCC) 11/28/2021:Palpable right breast lump 2.6 cm with calcifications at 3 o'clock position, additional 1.3 cm lesion is an intramammary lymph node biopsy: Benign, axilla negative, biopsy of the breast lump: Grade 3 IDC ER 0%, PR 0%, Ki-67 98%, HER2 3+ positive   Treatment plan: 1.  Neoadjuvant chemotherapy with Taxol Herceptin weekly x12 followed by Kadcyla maintenance completed 11/05/2022 2. Rt Mastectomy with sentinel lymph node biopsy: 03/27/2022: 4.2 cm grade 3 IDC with negative margins 0/2 lymph nodes negative, ER 0%, PR 0%, Ki-67 80%, HER2 3+ positive 3.  +/- Adjuvant  radiation ----------------------------------------------------------------------------------------------------------------- Current treatment: Kadcyla cycle 11 (final treatment) Kadcyla toxicities: 1.  Constipation: I encouraged her to take a stool softener regularly and Dulcolax as needed. 2. slight hair thinning 3.  Anemia: Today's hemoglobin is 11.2.  We are monitoring   Emergency room visit [place] for right hip pain: Diagnosed with osteoarthritis. She takes meloxicam and over-the-counter pain cream.    This concludes her treatment today. Return to clinic in 6 months for follow-up and after that we can see her once a year. She will be set up for a mammogram in the next month at [facility] on the left breast.   No orders of the defined types were  placed in this encounter.  The patient has a good understanding of the overall plan. she agrees with it. she will call with any problems that may develop before the next visit here. Total time spent: 30 mins including face to face time and time spent for planning, charting and co-ordination of care   Tamsen Meek, MD 11/05/22    I Janan Ridge am acting as a Neurosurgeon for [employer]  I have reviewed the above documentation for accuracy and completeness, and I agree with the above.

## 2022-11-05 NOTE — Progress Notes (Signed)
Per Dr.Gudena OK to proceed with ECHO from 07/17/2022 EF 55-60%.

## 2022-11-05 NOTE — Patient Instructions (Signed)
[facility]  Discharge Instructions: Thank you for choosing [facility] to provide your oncology and hematology care.   If you have a lab appointment with the Cancer Center, please go directly to the Cancer Center and check in at the registration area.   Wear comfortable clothing and clothing appropriate for easy access to any Portacath or PICC line.   We strive to give you quality time with your provider. You may need to reschedule your appointment if you arrive late (15 or more minutes).  Arriving late affects you and other patients whose appointments are after yours.  Also, if you miss three or more appointments without notifying the office, you may be dismissed from the clinic at the provider's discretion.      For prescription refill requests, have your pharmacy contact our office and allow 72 hours for refills to be completed.    Today you received the following chemotherapy and/or immunotherapy agents: Kadcyla.       To help prevent nausea and vomiting after your treatment, we encourage you to take your nausea medication as directed.  BELOW ARE SYMPTOMS THAT SHOULD BE REPORTED IMMEDIATELY: *FEVER GREATER THAN 100.4 F (38 C) OR HIGHER *CHILLS OR SWEATING *NAUSEA AND VOMITING THAT IS NOT CONTROLLED WITH YOUR NAUSEA MEDICATION *UNUSUAL SHORTNESS OF BREATH *UNUSUAL BRUISING OR BLEEDING *URINARY PROBLEMS (pain or burning when urinating, or frequent urination) *BOWEL PROBLEMS (unusual diarrhea, constipation, pain near the anus) TENDERNESS IN MOUTH AND THROAT WITH OR WITHOUT PRESENCE OF ULCERS (sore throat, sores in mouth, or a toothache) UNUSUAL RASH, SWELLING OR PAIN  UNUSUAL VAGINAL DISCHARGE OR ITCHING   Items with * indicate a potential emergency and should be followed up as soon as possible or go to the Emergency Department if any problems should occur.  Please show the CHEMOTHERAPY ALERT CARD or IMMUNOTHERAPY ALERT CARD at  check-in to the Emergency Department and triage nurse.  Should you have questions after your visit or need to cancel or reschedule your appointment, please contact [facility]  Dept: [phone]  and follow the prompts.  Office hours are 8:00 a.m. to 4:30 p.m. Monday - Friday. Please note that voicemails left after 4:00 p.m. may not be returned until the following business day.  We are closed weekends and major holidays. You have access to a nurse at all times for urgent questions. Please call the main number to the clinic Dept: [phone] and follow the prompts.   For any non-urgent questions, you may also contact your provider using MyChart. We now offer e-Visits for anyone 18 and older to request care online for non-urgent symptoms. For details visit [email or web address].   Also download the MyChart app! Go to the app store, search "MyChart", open the app, select [facility], and log in with your MyChart username and password.   

## 2022-11-05 NOTE — Assessment & Plan Note (Addendum)
11/28/2021:Palpable right breast lump 2.6 cm with calcifications at 3 o'clock position, additional 1.3 cm lesion is an intramammary lymph node biopsy: Benign, axilla negative, biopsy of the breast lump: Grade 3 IDC ER 0%, PR 0%, Ki-67 98%, HER2 3+ positive   Treatment plan: 1.  Neoadjuvant chemotherapy with Taxol Herceptin weekly x12 followed by Kadcyla maintenance 2. Rt Mastectomy with sentinel lymph node biopsy: 03/27/2022: 4.2 cm grade 3 IDC with negative margins 0/2 lymph nodes negative, ER 0%, PR 0%, Ki-67 80%, HER2 3+ positive 3.  Adjuvant radiation completed 06/13/2022 ----------------------------------------------------------------------------------------------------------------- Current treatment: Kadcyla cycle 11 Kadcyla toxicities: 1.  Constipation: I encouraged her to take a stool softener regularly and Dulcolax as needed. 2. slight hair thinning 3.  Anemia: Today's hemoglobin is 11.2.  We are monitoring   Emergency room visit [place] for right hip pain: Diagnosed with osteoarthritis. She takes meloxicam and over-the-counter pain cream.    Radiation 05/07/2022 Return to clinic every 3 weeks for [facility]

## 2022-11-07 ENCOUNTER — Telehealth: Payer: Self-pay | Admitting: Hematology and Oncology

## 2022-11-07 NOTE — Telephone Encounter (Signed)
Scheduled appointment per 4/30 los. Left voicemail.  

## 2022-11-08 ENCOUNTER — Other Ambulatory Visit: Payer: Self-pay

## 2022-11-15 ENCOUNTER — Encounter: Payer: Self-pay | Admitting: *Deleted

## 2022-11-15 DIAGNOSIS — Z171 Estrogen receptor negative status [ER-]: Secondary | ICD-10-CM

## 2022-11-18 DIAGNOSIS — E538 Deficiency of other specified B group vitamins: Secondary | ICD-10-CM | POA: Diagnosis not present

## 2022-11-19 ENCOUNTER — Encounter: Payer: Self-pay | Admitting: Internal Medicine

## 2022-11-19 ENCOUNTER — Ambulatory Visit: Payer: Medicare Other | Attending: Internal Medicine | Admitting: Internal Medicine

## 2022-11-19 VITALS — BP 121/73 | HR 52 | Ht 65.0 in | Wt 134.2 lb

## 2022-11-19 DIAGNOSIS — Z79899 Other long term (current) drug therapy: Secondary | ICD-10-CM | POA: Diagnosis not present

## 2022-11-19 DIAGNOSIS — Z5181 Encounter for therapeutic drug level monitoring: Secondary | ICD-10-CM | POA: Diagnosis not present

## 2022-11-19 NOTE — Progress Notes (Signed)
Cardiology Office Note:    Date:  11/19/2022   ID:  Sydney Key, DOB 12-Oct-1930, MRN [id number]  PCP:  Lindwood Qua, MD   [facility] Providers Cardiologist:  None     Referring MD: Lindwood Qua, MD   No chief complaint on file. Reduced GLS/EF on herceptin  History of Present Illness:    Sydney Key is a 89 y.o. female with a hx of R breast cancer diagnosed May 2023 , stage IIA ER/PR (-), HER2 (+) , 12/26/2021-03/12/2022 paclitaxel + herceptin, now on herceptin until 12/27/2022, s/p R mastectomy /adjuvant RT 05/06/2022-06/13/2022, no cardiac dx hx.. She has had surveillance echo and noted to have reduced strain and mild EF reduction. She is a non smoker. She is active. She comes in with her daughter today and is feeling well. No CP, SOB, PND/orthopnea, no LE edema.  Interim hx 5/14 No sob or CP.  No LH. BP are in a good range. Excited to be done with cancer therapy  Past Medical History:  Diagnosis Date   Anxiety    Cancer [facility]) 2023   Right breast cancer   History of kidney stones     Past Surgical History:  Procedure Laterality Date   ABDOMINAL HYSTERECTOMY  age 75   COLONOSCOPY N/A 05/14/2013   Procedure: COLONOSCOPY;  Surgeon: Florencia Reasons, MD;  Location: [facility];  Service: Endoscopy;  Laterality: N/A;   EYE SURGERY Left 07/09/2011   retina detachment   FOOT SURGERY Right    "shaved down" bone on Great toe   HOT HEMOSTASIS N/A 05/14/2013   Procedure: HOT HEMOSTASIS (ARGON PLASMA COAGULATION/BICAP);  Surgeon: Florencia Reasons, MD;  Location: [facility];  Service: Endoscopy;  Laterality: N/A;   PORTACATH PLACEMENT Right 01/04/2022   Procedure: PORT PLACEMENT WITH ULTRASOUND GUIDANCE;  Surgeon: Harriette Bouillon, MD;  Location: [facility];  Service: General;  Laterality: Right;   SIMPLE MASTECTOMY WITH AXILLARY SENTINEL NODE BIOPSY Right 03/27/2022   Procedure: RIGHT SIMPLE MASTECTOMY WITH RIGHT SENTINEL LYMPH NODE BIOPSY;  Surgeon:  Harriette Bouillon, MD;  Location: [facility];  Service: General;  Laterality: Right;    Current Medications: Current Meds  Medication Sig   acetaminophen (TYLENOL) 500 MG tablet Take 500 mg by mouth at bedtime.   albuterol (VENTOLIN HFA) 108 (90 Base) MCG/ACT inhaler Inhale into the lungs.   Ascorbic Acid (VITAMIN C WITH ROSE HIPS) 500 MG tablet Take 500 mg by mouth daily.   aspirin EC 81 MG tablet Take 81 mg by mouth every evening.   brimonidine (ALPHAGAN) 0.15 % ophthalmic solution Place 1 drop into the left eye 2 (two) times daily.   carvedilol (COREG) 3.125 MG tablet Take 1 tablet (3.125 mg total) by mouth 2 (two) times daily.   cholecalciferol (VITAMIN D) 1000 UNITS tablet Take 1,000 Units by mouth daily.   cyanocobalamin (,VITAMIN B-12,) 1000 MCG/ML injection Inject 1,000 mcg into the muscle every 30 (thirty) days.   escitalopram (LEXAPRO) 10 MG tablet Take 5 mg by mouth at bedtime.   gabapentin (NEURONTIN) 300 MG capsule Take 300 mg by mouth at bedtime.   Multiple Vitamin (MULTIVITAMIN WITH MINERALS) TABS tablet Take 1 tablet by mouth daily.     Allergies:   Paclitaxel   Social History   Socioeconomic History   Marital status: Widowed    Spouse name: Not on file   Number of children: 3   Years of education: Not on file   Highest education level: Not on  file  Occupational History   Not on file  Tobacco Use   Smoking status: Never   Smokeless tobacco: Never  Vaping Use   Vaping Use: Never used  Substance and Sexual Activity   Alcohol use: No   Drug use: No   Sexual activity: Not on file  Other Topics Concern   Not on file  Social History Narrative   Not on file   Social Determinants of Health   Financial Resource Strain: Not on file  Food Insecurity: No Food Insecurity (03/27/2022)   Hunger Vital Sign    Worried About Running Out of Food in the Last Year: Never true    Ran Out of Food in the Last Year: Never true  Transportation Needs: No Transportation Needs  (03/27/2022)   PRAPARE - Administrator, Civil Service (Medical): No    Lack of Transportation (Non-Medical): No  Physical Activity: Not on file  Stress: Not on file  Social Connections: Not on file     Family History: The patient's family history includes Breast cancer (age of onset: 26) in her niece; Breast cancer (age of onset: 24) in her daughter; Breast cancer (age of onset: 65) in her niece; Breast cancer (age of onset: 49) in her sister; Cancer (age of onset: 82) in her mother; Colon cancer (age of onset: 9) in her father.  ROS:   Please see the history of present illness.     All other systems reviewed and are negative.  EKGs/Labs/Other Studies Reviewed:    The following studies were reviewed today:   EKG:  EKG is  ordered today.  The ekg ordered today demonstrates   05/22/2022- NSR   11/19/2022- sinus bradycardia HR 52  TTE 12/13/2021 EF 60-65%, GLS -21.7%, nl RV fxn, RVSP 31 mmHg 04/25/2022 EF closer to 55%, GLS -17.7%, RVSP 24.5 mmHg 07/17/2022- EF 55-60%, GLS -19  Recent Labs: 05/22/2022: BNP 55.1 11/05/2022: ALT 26; BUN 20; Creatinine 0.67; Hemoglobin 11.4; Platelet Count 123; Potassium 3.8; Sodium 139  Recent Lipid Panel No results found for: "CHOL", "TRIG", "HDL", "CHOLHDL", "VLDL", "LDLCALC", "LDLDIRECT"   Risk Assessment/Calculations:     Physical Exam:    VS: Vitals:   11/19/22 1127  BP: 121/73  Pulse: (!) 52  SpO2: 100%       BP 121/73 (BP Location: Left Arm, Patient Position: Sitting, Cuff Size: Normal)   Pulse (!) 52   Ht 5\' 5"  (1.651 m)   Wt 134 lb 3.2 oz (60.9 kg)   SpO2 100%   BMI 22.33 kg/m     Wt Readings from Last 3 Encounters:  11/19/22 134 lb 3.2 oz (60.9 kg)  11/05/22 132 lb 9.6 oz (60.1 kg)  10/15/22 135 lb 6.4 oz (61.4 kg)     GEN:  Well nourished, well developed in no acute distress HEENT: Normal NECK: No JVD; No carotid bruits LYMPHATICS: No lymphadenopathy CARDIAC: RRR, no murmurs, rubs,  gallops RESPIRATORY:  Clear to auscultation without rales, wheezing or rhonchi  ABDOMEN: Soft, non-tender, non-distended MUSCULOSKELETAL:  No edema; No deformity  SKIN: Warm and dry NEUROLOGIC:  Alert and oriented x 3 PSYCHIATRIC:  Normal affect   ASSESSMENT:    Cardio-Onc: she has asymptomatic CTRCD with 19% reduction in strain. She is asymptomatic. EF is preserved. Low risk of accelerated coronary dx with R sided radiation. Will recommend a very low dose of a BB with supportive cardioprotective data in breast cancer patients. We discussed that if she experiences LH with standing, low  BPs etc to let us know and can stop her coreg; especially now that she is off therapy. Blood pressure looks good. Will continue for now. - cont. coreg 3.125 mg BID - EF and strain were normal in January - BNP 55 - she finished her herceptin, can consider TTE in 1 year following tx.  PLAN:    In order of problems listed above:  TTE limited EF, GLS Follow up 1 year      Medication Adjustments/Labs and Tests Ordered: Current medicines are reviewed at length with the patient today.  Concerns regarding medicines are outlined above.  No orders of the defined types were placed in this encounter.  No orders of the defined types were placed in this encounter.   Patient Instructions  Medication Instructions:  Continue same medications *If you need a refill on your cardiac medications before your next appointment, please call your pharmacy*   Lab Work: None ordered   Testing/Procedures: None ordered    Follow-Up: At [facility], you and your health needs are our priority.  As part of our continuing mission to provide you with exceptional heart care, we have created designated Provider Care Teams.  These Care Teams include your primary Cardiologist (physician) and Advanced Practice Providers (APPs -  Physician Assistants and Nurse Practitioners) who all work together to provide you with the  care you need, when you need it.  We recommend signing up for the patient portal called "MyChart".  Sign up information is provided on this After Visit Summary.  MyChart is used to connect with patients for Virtual Visits (Telemedicine).  Patients are able to view lab/test results, encounter notes, upcoming appointments, etc.  Non-urgent messages can be sent to your provider as well.   To learn more about what you can do with MyChart, go to [email or web address].    Your next appointment:  1 year    Provider: Dr.Gretna Bergin       Signed, Maisie Fus, MD  11/19/2022 1:48 PM    [facility]

## 2022-11-19 NOTE — Patient Instructions (Signed)
Medication Instructions:  Continue same medications *If you need a refill on your cardiac medications before your next appointment, please call your pharmacy*   Lab Work: None ordered   Testing/Procedures: None ordered   Follow-Up: At [facility], you and your health needs are our priority.  As part of our continuing mission to provide you with exceptional heart care, we have created designated Provider Care Teams.  These Care Teams include your primary Cardiologist (physician) and Advanced Practice Providers (APPs -  Physician Assistants and Nurse Practitioners) who all work together to provide you with the care you need, when you need it.  We recommend signing up for the patient portal called "MyChart".  Sign up information is provided on this After Visit Summary.  MyChart is used to connect with patients for Virtual Visits (Telemedicine).  Patients are able to view lab/test results, encounter notes, upcoming appointments, etc.  Non-urgent messages can be sent to your provider as well.   To learn more about what you can do with MyChart, go to [email or web address].    Your next appointment:  1 year     Provider:  Dr.Branch   

## 2022-12-13 ENCOUNTER — Ambulatory Visit: Payer: Self-pay | Admitting: Surgery

## 2022-12-13 DIAGNOSIS — C50311 Malignant neoplasm of lower-inner quadrant of right female breast: Secondary | ICD-10-CM | POA: Diagnosis not present

## 2022-12-13 DIAGNOSIS — Z95828 Presence of other vascular implants and grafts: Secondary | ICD-10-CM | POA: Diagnosis not present

## 2022-12-13 DIAGNOSIS — Z171 Estrogen receptor negative status [ER-]: Secondary | ICD-10-CM | POA: Diagnosis not present

## 2022-12-23 DIAGNOSIS — E538 Deficiency of other specified B group vitamins: Secondary | ICD-10-CM | POA: Diagnosis not present

## 2022-12-26 DIAGNOSIS — Z452 Encounter for adjustment and management of vascular access device: Secondary | ICD-10-CM | POA: Diagnosis not present

## 2022-12-30 ENCOUNTER — Ambulatory Visit: Payer: Medicare Other | Attending: Surgery

## 2022-12-30 VITALS — Wt 129.1 lb

## 2022-12-30 DIAGNOSIS — Z483 Aftercare following surgery for neoplasm: Secondary | ICD-10-CM | POA: Insufficient documentation

## 2022-12-30 NOTE — Therapy (Signed)
OUTPATIENT PHYSICAL THERAPY SOZO SCREENING NOTE   Patient Name: Sydney Key MRN: [id number] DOB:1931/01/26, 89 y.o., female Today's Date: 12/30/2022  PCP: Lindwood Qua, MD REFERRING PROVIDER: Harriette Bouillon, MD   PT End of Session - 12/30/22 1542     Visit Number 2   # unchanged due to screen only   PT Start Time 1540    PT Stop Time 1550    PT Time Calculation (min) 10 min    Activity Tolerance Patient tolerated treatment well    Behavior During Therapy [facility] for tasks assessed/performed             Past Medical History:  Diagnosis Date   Anxiety    Cancer (HCC) 2023   Right breast cancer   History of kidney stones    Past Surgical History:  Procedure Laterality Date   ABDOMINAL HYSTERECTOMY  age 89   COLONOSCOPY N/A 05/14/2013   Procedure: COLONOSCOPY;  Surgeon: Florencia Reasons, MD;  Location: [facility];  Service: Endoscopy;  Laterality: N/A;   EYE SURGERY Left 07/09/2011   retina detachment   FOOT SURGERY Right    "shaved down" bone on Great toe   HOT HEMOSTASIS N/A 05/14/2013   Procedure: HOT HEMOSTASIS (ARGON PLASMA COAGULATION/BICAP);  Surgeon: Florencia Reasons, MD;  Location: [facility];  Service: Endoscopy;  Laterality: N/A;   PORTACATH PLACEMENT Right 01/04/2022   Procedure: PORT PLACEMENT WITH ULTRASOUND GUIDANCE;  Surgeon: Harriette Bouillon, MD;  Location: [facility];  Service: General;  Laterality: Right;   SIMPLE MASTECTOMY WITH AXILLARY SENTINEL NODE BIOPSY Right 03/27/2022   Procedure: RIGHT SIMPLE MASTECTOMY WITH RIGHT SENTINEL LYMPH NODE BIOPSY;  Surgeon: Harriette Bouillon, MD;  Location: [facility];  Service: General;  Laterality: Right;   Patient Active Problem List   Diagnosis Date Noted   Hypercholesteremia 05/20/2022   Vitamin B12 deficiency 05/20/2022   Port-A-Cath in place 01/09/2022   Genetic testing 12/21/2021   Family history of breast cancer 12/06/2021   Malignant neoplasm of lower-inner quadrant of right breast of  female, estrogen receptor negative (HCC) 12/04/2021   Resides in long term care facility 08/18/2020   Closed subcapital fracture of neck of femur, left, initial encounter (HCC) 06/06/2020   SBO (small bowel obstruction) (HCC) 04/08/2020   Chronic right shoulder pain 01/31/2020   Glaucoma of both eyes 01/31/2020   Restless legs syndrome 01/25/2019   Skin lesion of left leg 01/25/2019   Bilateral hearing loss 10/02/2015   Osteopenia after menopause 06/08/2014   GERD (gastroesophageal reflux disease) 01/25/2013   Chronic low back pain 01/24/2013   Colon polyps 01/24/2013    REFERRING DIAG: right breast cancer at risk for lymphedema  THERAPY DIAG: Aftercare following surgery for neoplasm  PERTINENT HISTORY: Patient was diagnosed on 11/19/2021 with right 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 It measures 2.6 cm and is located in the upper inner quadrant. It is ER/PR negative and HER2 positive with a Ki67 of 98%. She had a left hip fracture in 05/2020 which required surgery and had a right foot bunion surgery in 08/2021.   PRECAUTIONS: right UE Lymphedema risk,   SUBJECTIVE: Here for 3 month SOZO screen. :I just had my port removed Friday and it's still swollen."   PAIN:  Are you having pain? No  SOZO SCREENING: Patient was assessed today using the SOZO machine to determine the lymphedema index score. This was compared to her baseline score. It was determined that she is within the recommended range when  compared to her baseline and no further action is needed at this time. She will continue SOZO screenings. These are done every 3 months for 2 years post operatively followed by every 6 months for 2 years, and then annually.  Port site has small area of what looks and feels like fluid. Encouraged pt and daughter that if this does not improve in next few days, or worsens (educated on infection signs and symptoms) to call surgeon. They both verbalized understanding. Picture taken and  under media tab.   L-DEX FLOWSHEETS - 12/30/22 1500       L-DEX LYMPHEDEMA SCREENING   Measurement Type Unilateral    L-DEX MEASUREMENT EXTREMITY Upper Extremity    POSITION  Standing    DOMINANT SIDE Right    At Risk Side Right    BASELINE SCORE (UNILATERAL) -0.1    L-DEX SCORE (UNILATERAL) -2.4    VALUE CHANGE (UNILAT) -2.3              Hermenia Bers, PTA 12/30/2022, 4:07 PM

## 2023-01-22 DIAGNOSIS — E538 Deficiency of other specified B group vitamins: Secondary | ICD-10-CM | POA: Diagnosis not present

## 2023-02-06 ENCOUNTER — Telehealth: Payer: Self-pay | Admitting: Adult Health

## 2023-02-14 ENCOUNTER — Encounter: Payer: Medicare Other | Admitting: Adult Health

## 2023-02-17 DIAGNOSIS — H401123 Primary open-angle glaucoma, left eye, severe stage: Secondary | ICD-10-CM | POA: Diagnosis not present

## 2023-02-21 ENCOUNTER — Encounter: Payer: Self-pay | Admitting: Adult Health

## 2023-02-21 ENCOUNTER — Other Ambulatory Visit: Payer: Self-pay

## 2023-02-21 ENCOUNTER — Other Ambulatory Visit: Payer: Self-pay | Admitting: *Deleted

## 2023-02-21 ENCOUNTER — Inpatient Hospital Stay: Payer: Medicare Other | Attending: Adult Health | Admitting: Adult Health

## 2023-02-21 ENCOUNTER — Encounter: Payer: Self-pay | Admitting: Hematology and Oncology

## 2023-02-21 VITALS — BP 110/64 | HR 62 | Temp 97.5°F | Resp 18 | Ht 65.0 in | Wt 130.8 lb

## 2023-02-21 DIAGNOSIS — Z803 Family history of malignant neoplasm of breast: Secondary | ICD-10-CM | POA: Insufficient documentation

## 2023-02-21 DIAGNOSIS — Z171 Estrogen receptor negative status [ER-]: Secondary | ICD-10-CM | POA: Diagnosis not present

## 2023-02-21 DIAGNOSIS — R42 Dizziness and giddiness: Secondary | ICD-10-CM | POA: Insufficient documentation

## 2023-02-21 DIAGNOSIS — Z1231 Encounter for screening mammogram for malignant neoplasm of breast: Secondary | ICD-10-CM

## 2023-02-21 DIAGNOSIS — C50311 Malignant neoplasm of lower-inner quadrant of right female breast: Secondary | ICD-10-CM | POA: Insufficient documentation

## 2023-02-21 NOTE — Addendum Note (Signed)
Addended by: Noreene Filbert on: 02/21/2023 03:58 PM   Modules accepted: Orders

## 2023-02-21 NOTE — Progress Notes (Addendum)
SURVIVORSHIP VISIT:  BRIEF ONCOLOGIC HISTORY:  Oncology History  Malignant neoplasm of lower-inner quadrant of right breast of female, estrogen receptor negative (HCC)  11/28/2021 Initial Diagnosis   Palpable right breast lump 2.6 cm with calcifications at 3 o'clock position, additional 1.3 cm lesion is an intramammary lymph node biopsy: Benign, axilla negative, biopsy of the breast lump: Grade 3 IDC ER 0%, PR 0%, Ki-67 98%, HER2 3+ positive   12/05/2021 Cancer Staging   Staging form: Breast, AJCC 8th Edition - Clinical: Stage IIA (cT2, cN0, cM0, G3, ER-, PR-, HER2+) - Signed by Serena Croissant, MD on 12/05/2021 Stage prefix: Initial diagnosis Histologic grading system: 3 grade system   12/26/2021 - 03/12/2022 Chemotherapy   Patient is on Treatment Plan : BREAST Paclitaxel + Trastuzumab q7d / Trastuzumab q21d      Genetic Testing   [facility]ry CustomNext Panel was Negative. Report date is 12/18/2021.  The CustomNext-Cancer+RNAinsight panel offered by [employer] Dupes includes sequencing and rearrangement analysis for the following 47 genes:  APC, ATM, AXIN2, BARD1, BMPR1A, BRCA1, BRCA2, BRIP1, CDH1, CDK4, CDKN2A, CHEK2, CTNNA1, DICER1, EPCAM, GREM1, HOXB13, KIT, MEN1, MLH1, MSH2, MSH3, MSH6, MUTYH, NBN, NF1, NTHL1, PALB2, PDGFRA, PMS2, POLD1, POLE, PTEN, RAD50, RAD51C, RAD51D, SDHA, SDHB, SDHC, SDHD, SMAD4, SMARCA4, STK11, TP53, TSC1, TSC2, and VHL.  RNA data is routinely analyzed for use in variant interpretation for all genes.   03/27/2022 Surgery   Right breast mastectomy: Invasive ductal carcinoma Nottingham grade 3 with no morphologic evidence of neoadjuvant chemotherapy effect negative for lymphovascular and perineural invasion the size noted was 4.2 x 3.7 x 3.5 cm margins were negative and 2 lymph nodes biopsied were negative. Cancer staging: ypT2 pN0 pM0   04/08/2022 -  Chemotherapy   Patient is on Treatment Plan : BREAST ADO-Trastuzumab Emtansine (Kadcyla) q21d     05/06/2022 - 06/13/2022 Radiation  Therapy   Adjuvant radiation therapy     INTERVAL HISTORY:  Sydney Key to review her survivorship care plan detailing her treatment course for breast cancer, as well as monitoring long-term side effects of that treatment, education regarding health maintenance, screening, and overall wellness and health promotion.     Overall, Sydney Key reports feeling quite well.  She completed Kadcyla therapy at the end of April.  She is doing well recovering from her treatments.  Does note that when she goes from sitting to standing she becomes a little bit lightheaded.  She is not drinking water.   REVIEW OF SYSTEMS:  Review of Systems  Constitutional:  Negative for appetite change, chills, fatigue, fever and unexpected weight change.  HENT:   Negative for hearing loss, lump/mass and trouble swallowing.   Eyes:  Negative for eye problems and icterus.  Respiratory:  Negative for chest tightness, cough and shortness of breath.   Cardiovascular:  Negative for chest pain, leg swelling and palpitations.  Gastrointestinal:  Negative for abdominal distention, abdominal pain, constipation, diarrhea, nausea and vomiting.  Endocrine: Negative for hot flashes.  Genitourinary:  Negative for difficulty urinating.   Musculoskeletal:  Negative for arthralgias.  Skin:  Negative for itching and rash.  Neurological:  Negative for dizziness, extremity weakness, headaches and numbness.  Hematological:  Negative for adenopathy. Does not bruise/bleed easily.  Psychiatric/Behavioral:  Negative for depression. The patient is not nervous/anxious.    Breast: Denies any new nodularity, masses, tenderness, nipple changes, or nipple discharge.       PAST MEDICAL/SURGICAL HISTORY:  Past Medical History:  Diagnosis Date   Anxiety    Cancer (  HCC) 2023   Right breast cancer   History of kidney stones    Past Surgical History:  Procedure Laterality Date   ABDOMINAL HYSTERECTOMY  age 17   COLONOSCOPY N/A 05/14/2013    Procedure: COLONOSCOPY;  Surgeon: Florencia Reasons, MD;  Location: [facility];  Service: Endoscopy;  Laterality: N/A;   EYE SURGERY Left 07/09/2011   retina detachment   FOOT SURGERY Right    "shaved down" bone on Great toe   HOT HEMOSTASIS N/A 05/14/2013   Procedure: HOT HEMOSTASIS (ARGON PLASMA COAGULATION/BICAP);  Surgeon: Florencia Reasons, MD;  Location: [facility];  Service: Endoscopy;  Laterality: N/A;   PORTACATH PLACEMENT Right 01/04/2022   Procedure: PORT PLACEMENT WITH ULTRASOUND GUIDANCE;  Surgeon: Harriette Bouillon, MD;  Location: [facility];  Service: General;  Laterality: Right;   SIMPLE MASTECTOMY WITH AXILLARY SENTINEL NODE BIOPSY Right 03/27/2022   Procedure: RIGHT SIMPLE MASTECTOMY WITH RIGHT SENTINEL LYMPH NODE BIOPSY;  Surgeon: Harriette Bouillon, MD;  Location: [facility];  Service: General;  Laterality: Right;     ALLERGIES:  Allergies  Allergen Reactions   Paclitaxel     Back pain Pt states she doesn't think this is an allergy. It wasn't back pain. She just felt a cold sensation down her back when receiving the medication     CURRENT MEDICATIONS:  Outpatient Encounter Medications as of 02/21/2023  Medication Sig Note   acetaminophen (TYLENOL) 500 MG tablet Take 500 mg by mouth at bedtime.    albuterol (VENTOLIN HFA) 108 (90 Base) MCG/ACT inhaler Inhale into the lungs.    Ascorbic Acid (VITAMIN C WITH ROSE HIPS) 500 MG tablet Take 500 mg by mouth daily.    aspirin EC 81 MG tablet Take 81 mg by mouth every evening.    brimonidine (ALPHAGAN) 0.15 % ophthalmic solution Place 1 drop into the left eye 2 (two) times daily.    carvedilol (COREG) 3.125 MG tablet Take 1 tablet (3.125 mg total) by mouth 2 (two) times daily.    cholecalciferol (VITAMIN D) 1000 UNITS tablet Take 1,000 Units by mouth daily.    cyanocobalamin (,VITAMIN B-12,) 1000 MCG/ML injection Inject 1,000 mcg into the muscle every 30 (thirty) days.    escitalopram (LEXAPRO) 10 MG tablet Take 5 mg  by mouth at bedtime.    gabapentin (NEURONTIN) 300 MG capsule Take 300 mg by mouth at bedtime.    Multiple Vitamin (MULTIVITAMIN WITH MINERALS) TABS tablet Take 1 tablet by mouth daily.    [DISCONTINUED] prochlorperazine (COMPAZINE) 10 MG tablet Take 1 tablet (10 mg total) by mouth every 6 (six) hours as needed (Nausea or vomiting). 12/17/2021: Has not started   No facility-administered encounter medications on file as of 02/21/2023.     ONCOLOGIC FAMILY HISTORY:  Family History  Problem Relation Age of Onset   Cancer Mother 73       unknown type- the cancer was on her neck   Colon cancer Father 21   Breast cancer Sister 46   Breast cancer Niece 24   Breast cancer Niece 47   Breast cancer Daughter 7       negative genetic testing     SOCIAL HISTORY:  Social History   Socioeconomic History   Marital status: Widowed    Spouse name: Not on file   Number of children: 3   Years of education: Not on file   Highest education level: Not on file  Occupational History   Not on file  Tobacco Use  Smoking status: Never   Smokeless tobacco: Never  Vaping Use   Vaping status: Never Used  Substance and Sexual Activity   Alcohol use: No   Drug use: No   Sexual activity: Not on file  Other Topics Concern   Not on file  Social History Narrative   Not on file   Social Determinants of Health   Financial Resource Strain: Low Risk  (06/10/2022)   Received from [facility], [facility]   Overall Financial Resource Strain (CARDIA)    Difficulty of Paying Living Expenses: Not hard at all  Food Insecurity: No Food Insecurity (06/10/2022)   Received from [facility], [facility]   Hunger Vital Sign    Worried About Running Out of Food in the Last Year: Never true    Ran Out of Food in the Last Year: Never true  Transportation Needs: No Transportation Needs (08/20/2022)   Received from [facility], [facility]   [facility] - Transportation    Lack of  Transportation (Medical): No    Lack of Transportation (Non-Medical): No  Physical Activity: Sufficiently Active (06/10/2022)   Received from [facility], [facility]   Exercise Vital Sign    Days of Exercise per Week: 7 days    Minutes of Exercise per Session: 40 min  Stress: No Stress Concern Present (06/10/2022)   Received from [facility], [facility] - Occupational Stress Questionnaire    Feeling of Stress : Not at all  Social Connections: Moderately Integrated (06/10/2022)   Received from [facility], [facility]   Social Connection and Isolation Panel [NHANES]    Frequency of Communication with Friends and Family: More than three times a week    Frequency of Social Gatherings with Friends and Family: More than three times a week    Attends Religious Services: More than 4 times per year    Active Member of [employer] or Organizations: Yes    Attends Banker Meetings: More than 4 times per year    Marital Status: Widowed  Intimate Partner Violence: Not At Risk (06/10/2022)   Received from [facility], [facility]   Humiliation, Afraid, Rape, and Kick questionnaire    Fear of Current or Ex-Partner: No    Emotionally Abused: No    Physically Abused: No    Sexually Abused: No     OBSERVATIONS/OBJECTIVE:  BP 110/64 (BP Location: Left Arm, Patient Position: Sitting)   Pulse 62   Temp (!) 97.5 F (36.4 C) (Temporal)   Resp 18   Ht 5\' 5"  (1.651 m)   Wt 130 lb 12.8 oz (59.3 kg)   SpO2 98%   BMI 21.77 kg/m  GENERAL: Patient is a well appearing female in no acute distress HEENT:  Sclerae anicteric.  Oropharynx clear and moist. No ulcerations or evidence of oropharyngeal candidiasis. Neck is supple.  NODES:  No cervical, supraclavicular, or axillary lymphadenopathy palpated.  BREAST EXAM: Right breast status postmastectomy and radiation no sign of local recurrence left breast is benign. LUNGS:  Clear  to auscultation bilaterally.  No wheezes or rhonchi. HEART:  Regular rate and rhythm. No murmur appreciated. ABDOMEN:  Soft, nontender.  Positive, normoactive bowel sounds. No organomegaly palpated. MSK:  No focal spinal tenderness to palpation. Full range of motion bilaterally in the upper extremities. EXTREMITIES:  No peripheral edema.   SKIN:  Clear with no obvious  rashes or skin changes. No nail dyscrasia. NEURO:  Nonfocal. Well oriented.  Appropriate affect.   LABORATORY DATA:  None for this visit.  DIAGNOSTIC IMAGING:  None for this visit.      ASSESSMENT AND PLAN:  Ms.. Key is a pleasant 89 y.o. female with Stage 2A right breast invasive ductal carcinoma, ER-/PR-/HER2+, diagnosed in 11/2021, treated with neoadjuvant chemotherapy, mastectomy, and adjuvant radiation therapy.  She presents to the Survivorship Clinic for our initial meeting and routine follow-up post-completion of treatment for breast cancer.    1. Stage 2A right breast cancer:  Sydney Key is continuing to recover from definitive treatment for breast cancer. She will follow-up with her medical oncologist, Dr. Pamelia Hoit in October 2024 with history and physical exam per surveillance protocol. Her mammogram is due; orders placed today.   Today, a comprehensive survivorship care plan and treatment summary was reviewed with the patient today detailing her breast cancer diagnosis, treatment course, potential late/long-term effects of treatment, appropriate follow-up care with recommendations for the future, and patient education resources.  A copy of this summary, along with a letter will be sent to the patient's primary care provider via mail/fax/In Basket message after today's visit.    2. Bone health:   She was given education on specific activities to promote bone health.  3.  Lightheadedness with position changes: I recommended that she increase her water intake.  I also discontinued her carvedilol.  Her blood pressure  today was 110/64.  4. Cancer screening:  Due to Sydney Key's history and her age, she should receive screening for skin cancers.  The information and recommendations are listed on the patient's comprehensive care plan/treatment summary and were reviewed in detail with the patient.    5. Health maintenance and wellness promotion: Sydney Key was encouraged to consume 5-7 servings of fruits and vegetables per day. We reviewed the "Nutrition Rainbow" handout.  She was also encouraged to engage in moderate to vigorous exercise for 30 minutes per day most days of the week.  She was instructed to limit her alcohol consumption and continue to abstain from tobacco use.     6. Support services/counseling: It is not uncommon for this period of the patient's cancer care trajectory to be one of many emotions and stressors. She was given information regarding our available services and encouraged to contact me with any questions or for help enrolling in any of our support group/programs.    Follow up instructions:    -Return to cancer center October 2024 -Mammogram due in 2 to 4 weeks -She is welcome to return back to the Survivorship Clinic at any time; no additional follow-up needed at this time.  -Consider referral back to survivorship as a long-term survivor for continued surveillance  The patient was provided an opportunity to ask questions and all were answered. The patient agreed with the plan and demonstrated an understanding of the instructions.   Total encounter time:45 minutes*in face-to-face visit time, chart review, lab review, care coordination, order entry, and documentation of the encounter time.    Lillard Anes, NP 02/21/23 2:24 PM Medical Oncology and Hematology [facility] [address] [place], [place] [address] Tel. [phone]    Fax. [phone]  *Total Encounter Time as defined by the Centers for Medicare and Medicaid Services includes, in addition to the  face-to-face time of a patient visit (documented in the note above) non-face-to-face time: obtaining and reviewing outside history, ordering and reviewing medications, tests or procedures, care coordination (  communications with other health care professionals or caregivers) and documentation in the medical record.

## 2023-02-24 DIAGNOSIS — E538 Deficiency of other specified B group vitamins: Secondary | ICD-10-CM | POA: Diagnosis not present

## 2023-03-03 DIAGNOSIS — Z1231 Encounter for screening mammogram for malignant neoplasm of breast: Secondary | ICD-10-CM | POA: Diagnosis not present

## 2023-03-07 ENCOUNTER — Encounter: Payer: Self-pay | Admitting: Adult Health

## 2023-03-28 DIAGNOSIS — Z23 Encounter for immunization: Secondary | ICD-10-CM | POA: Diagnosis not present

## 2023-03-28 DIAGNOSIS — E538 Deficiency of other specified B group vitamins: Secondary | ICD-10-CM | POA: Diagnosis not present

## 2023-03-31 ENCOUNTER — Ambulatory Visit: Payer: Medicare Other | Attending: Surgery

## 2023-03-31 DIAGNOSIS — C50919 Malignant neoplasm of unspecified site of unspecified female breast: Secondary | ICD-10-CM | POA: Diagnosis not present

## 2023-03-31 DIAGNOSIS — Z483 Aftercare following surgery for neoplasm: Secondary | ICD-10-CM | POA: Insufficient documentation

## 2023-04-01 DIAGNOSIS — L570 Actinic keratosis: Secondary | ICD-10-CM | POA: Diagnosis not present

## 2023-04-01 DIAGNOSIS — L568 Other specified acute skin changes due to ultraviolet radiation: Secondary | ICD-10-CM | POA: Diagnosis not present

## 2023-04-01 DIAGNOSIS — G548 Other nerve root and plexus disorders: Secondary | ICD-10-CM | POA: Diagnosis not present

## 2023-04-01 DIAGNOSIS — L905 Scar conditions and fibrosis of skin: Secondary | ICD-10-CM | POA: Diagnosis not present

## 2023-04-08 DIAGNOSIS — M25561 Pain in right knee: Secondary | ICD-10-CM | POA: Diagnosis not present

## 2023-04-08 DIAGNOSIS — M1611 Unilateral primary osteoarthritis, right hip: Secondary | ICD-10-CM | POA: Diagnosis not present

## 2023-04-08 DIAGNOSIS — M222X1 Patellofemoral disorders, right knee: Secondary | ICD-10-CM | POA: Diagnosis not present

## 2023-04-08 DIAGNOSIS — M2341 Loose body in knee, right knee: Secondary | ICD-10-CM | POA: Diagnosis not present

## 2023-04-08 DIAGNOSIS — Z96642 Presence of left artificial hip joint: Secondary | ICD-10-CM | POA: Diagnosis not present

## 2023-04-08 DIAGNOSIS — M25551 Pain in right hip: Secondary | ICD-10-CM | POA: Diagnosis not present

## 2023-04-08 DIAGNOSIS — M1711 Unilateral primary osteoarthritis, right knee: Secondary | ICD-10-CM | POA: Diagnosis not present

## 2023-04-22 ENCOUNTER — Other Ambulatory Visit: Payer: Self-pay

## 2023-04-28 DIAGNOSIS — E538 Deficiency of other specified B group vitamins: Secondary | ICD-10-CM | POA: Diagnosis not present

## 2023-05-07 ENCOUNTER — Ambulatory Visit: Payer: Medicare Other | Attending: Surgery | Admitting: Physical Therapy

## 2023-05-07 ENCOUNTER — Inpatient Hospital Stay: Payer: Medicare Other | Attending: Adult Health | Admitting: Hematology and Oncology

## 2023-05-07 VITALS — BP 118/63 | HR 58 | Temp 97.2°F | Resp 18 | Ht 65.0 in | Wt 133.0 lb

## 2023-05-07 DIAGNOSIS — M25469 Effusion, unspecified knee: Secondary | ICD-10-CM | POA: Diagnosis not present

## 2023-05-07 DIAGNOSIS — M1990 Unspecified osteoarthritis, unspecified site: Secondary | ICD-10-CM | POA: Insufficient documentation

## 2023-05-07 DIAGNOSIS — M25551 Pain in right hip: Secondary | ICD-10-CM | POA: Insufficient documentation

## 2023-05-07 DIAGNOSIS — Z171 Estrogen receptor negative status [ER-]: Secondary | ICD-10-CM | POA: Diagnosis not present

## 2023-05-07 DIAGNOSIS — Z9011 Acquired absence of right breast and nipple: Secondary | ICD-10-CM | POA: Diagnosis not present

## 2023-05-07 DIAGNOSIS — G8929 Other chronic pain: Secondary | ICD-10-CM | POA: Diagnosis not present

## 2023-05-07 DIAGNOSIS — Z853 Personal history of malignant neoplasm of breast: Secondary | ICD-10-CM | POA: Insufficient documentation

## 2023-05-07 DIAGNOSIS — C50311 Malignant neoplasm of lower-inner quadrant of right female breast: Secondary | ICD-10-CM | POA: Diagnosis not present

## 2023-05-07 DIAGNOSIS — C50211 Malignant neoplasm of upper-inner quadrant of right female breast: Secondary | ICD-10-CM | POA: Insufficient documentation

## 2023-05-07 NOTE — Progress Notes (Signed)
Patient Care Team: Lindwood Qua, MD as PCP - General (Internal Medicine) Harriette Bouillon, MD as Consulting Physician (General Surgery) Serena Croissant, MD as Consulting Physician (Hematology and Oncology) Lonie Peak, MD as Attending Physician (Radiation Oncology) Maisie Fus, MD as Consulting Physician (Cardiology)  DIAGNOSIS:  Encounter Diagnosis  Name Primary?   Malignant neoplasm of lower-inner quadrant of right breast of female, estrogen receptor negative (HCC) Yes    SUMMARY OF ONCOLOGIC HISTORY: Oncology History  Malignant neoplasm of lower-inner quadrant of right breast of female, estrogen receptor negative (HCC)  11/28/2021 Initial Diagnosis   Palpable right breast lump 2.6 cm with calcifications at 3 o'clock position, additional 1.3 cm lesion is an intramammary lymph node biopsy: Benign, axilla negative, biopsy of the breast lump: Grade 3 IDC ER 0%, PR 0%, Ki-67 98%, HER2 3+ positive   12/05/2021 Cancer Staging   Staging form: Breast, AJCC 8th Edition - Clinical: Stage IIA (cT2, cN0, cM0, G3, ER-, PR-, HER2+) - Signed by Serena Croissant, MD on 12/05/2021 Stage prefix: Initial diagnosis Histologic grading system: 3 grade system   12/26/2021 - 03/12/2022 Chemotherapy   Patient is on Treatment Plan : BREAST Paclitaxel + Trastuzumab q7d / Trastuzumab q21d      Genetic Testing   [facility]ry CustomNext Panel was Negative. Report date is 12/18/2021.  The CustomNext-Cancer+RNAinsight panel offered by [employer]arna Dupes includes sequencing and rearrangement analysis for the following 47 genes:  APC, ATM, AXIN2, BARD1, BMPR1A, BRCA1, BRCA2, BRIP1, CDH1, CDK4, CDKN2A, CHEK2, CTNNA1, DICER1, EPCAM, GREM1, HOXB13, KIT, MEN1, MLH1, MSH2, MSH3, MSH6, MUTYH, NBN, NF1, NTHL1, PALB2, PDGFRA, PMS2, POLD1, POLE, PTEN, RAD50, RAD51C, RAD51D, SDHA, SDHB, SDHC, SDHD, SMAD4, SMARCA4, STK11, TP53, TSC1, TSC2, and VHL.  RNA data is routinely analyzed for use in variant interpretation for all genes.    03/27/2022 Surgery   Right breast mastectomy: Invasive ductal carcinoma Nottingham grade 3 with no morphologic evidence of neoadjuvant chemotherapy effect negative for lymphovascular and perineural invasion the size noted was 4.2 x 3.7 x 3.5 cm margins were negative and 2 lymph nodes biopsied were negative. Cancer staging: ypT2 pN0 pM0   04/08/2022 -  Chemotherapy   Patient is on Treatment Plan : BREAST ADO-Trastuzumab Emtansine (Kadcyla) q21d     05/06/2022 - 06/13/2022 Radiation Therapy   Adjuvant radiation therapy     CHIEF COMPLIANT:  History of Present Illness   The patient, with a history of breast cancer, presents with right hip pain and a swollen knee. The hip pain, which has been present for an extended period, radiates down the leg intermittently. The patient also reports a swollen knee, which is painful and contains fluid.  In addition to the musculoskeletal complaints, the patient mentions tenderness in the rib area, which she attributes to arthritis. She denies any new or concerning symptoms related to her history of breast cancer. The patient's energy levels are generally good, but she expresses concern about the impact of her hip and knee pain on her mobility and quality of life.         ALLERGIES:  is allergic to paclitaxel.  MEDICATIONS:  Current Outpatient Medications  Medication Sig Dispense Refill   acetaminophen (TYLENOL) 500 MG tablet Take 500 mg by mouth at bedtime.     albuterol (VENTOLIN HFA) 108 (90 Base) MCG/ACT inhaler Inhale into the lungs.     Ascorbic Acid (VITAMIN C WITH ROSE HIPS) 500 MG tablet Take 500 mg by mouth daily.     aspirin EC 81 MG tablet Take 81  mg by mouth every evening.     brimonidine (ALPHAGAN) 0.15 % ophthalmic solution Place 1 drop into the left eye 2 (two) times daily.     cholecalciferol (VITAMIN D) 1000 UNITS tablet Take 1,000 Units by mouth daily.     cyanocobalamin (,VITAMIN B-12,) 1000 MCG/ML injection Inject 1,000 mcg into the  muscle every 30 (thirty) days.     escitalopram (LEXAPRO) 10 MG tablet Take 5 mg by mouth at bedtime.     gabapentin (NEURONTIN) 300 MG capsule Take 300 mg by mouth at bedtime.     Multiple Vitamin (MULTIVITAMIN WITH MINERALS) TABS tablet Take 1 tablet by mouth daily.     No current facility-administered medications for this visit.    PHYSICAL EXAMINATION: ECOG PERFORMANCE STATUS: 1 - Symptomatic but completely ambulatory  Vitals:   05/07/23 1149  BP: 118/63  Pulse: (!) 58  Resp: 18  Temp: (!) 97.2 F (36.2 C)  SpO2: 100%   Filed Weights   05/07/23 1149  Weight: 133 lb (60.3 kg)    Physical Exam   MUSCULOSKELETAL: Right hip exhibits puffiness indicative of fluid accumulation.      (exam performed in the presence of a chaperone)  LABORATORY DATA:  I have reviewed the data as listed    Latest Ref Rng & Units 11/05/2022   10:35 AM 10/15/2022   10:01 AM 09/25/2022   10:49 AM  CMP  Glucose 70 - 99 mg/dL 94  98  95   BUN 8 - 23 mg/dL 20  20  19    Creatinine 0.44 - 1.00 mg/dL 4.85  4.62  7.03   Sodium 135 - 145 mmol/L 139  139  138   Potassium 3.5 - 5.1 mmol/L 3.8  4.2  4.1   Chloride 98 - 111 mmol/L 105  105  104   CO2 22 - 32 mmol/L 28  30  29    Calcium 8.9 - 10.3 mg/dL 9.7  9.4  9.6   Total Protein 6.5 - 8.1 g/dL 6.7  6.8  6.8   Total Bilirubin 0.3 - 1.2 mg/dL 1.0  0.9  0.9   Alkaline Phos 38 - 126 U/L 69  69  68   AST 15 - 41 U/L 35  32  33   ALT 0 - 44 U/L 26  25  25      Lab Results  Component Value Date   WBC 3.7 (L) 11/05/2022   HGB 11.4 (L) 11/05/2022   HCT 35.4 (L) 11/05/2022   MCV 94.1 11/05/2022   PLT 123 (L) 11/05/2022   NEUTROABS 2.0 11/05/2022    ASSESSMENT & PLAN:  Malignant neoplasm of lower-inner quadrant of right breast of female, estrogen receptor negative (HCC) 11/28/2021:Palpable right breast lump 2.6 cm with calcifications at 3 o'clock position, additional 1.3 cm lesion is an intramammary lymph node biopsy: Benign, axilla negative, biopsy of  the breast lump: Grade 3 IDC ER 0%, PR 0%, Ki-67 98%, HER2 3+ positive   Treatment plan: 1.  Neoadjuvant chemotherapy with Taxol Herceptin weekly x12 followed by Kadcyla maintenance completed 11/05/2022 2. Rt Mastectomy with sentinel lymph node biopsy: 03/27/2022: 4.2 cm grade 3 IDC with negative margins 0/2 lymph nodes negative, ER 0%, PR 0%, Ki-67 80%, HER2 3+ positive 3.  Adjuvant radiation completed 06/13/2022 ----------------------------------------------------------------------------------------------------------------- Current treatment: Surveillance   Breast cancer surveillance: Breast exam 05/07/2023: Benign Mammogram 03/03/2023: Benign at [facility], density category B Return to clinic in 1 year for follow-up ------------------------------------- Assessment and Plan    Breast  Cancer Completed treatment 6 months ago. Mammogram in August showed no issues or concerns. Breast density is category B, which is less dense and favorable for mammogram accuracy. -Continue annual mammograms every August. -Follow-up in one year.  Hip Pain and Swelling Chronic hip pain with radiation down the leg. Swelling in the knee with fluid accumulation. Diagnosis of arthritis made previously. Orthopedic consultation scheduled. -Discuss with orthopedic surgeon the need for further imaging (CT scan) or if the pain could be due to bursitis or sciatica. -Consider fluid drainage for knee swelling if suggested by orthopedic surgeon.  Rib Tenderness Tenderness in the rib area, likely due to arthritis. -Reassurance provided that rib tenderness is not uncommon and does not indicate cancer.          No orders of the defined types were placed in this encounter.  The patient has a good understanding of the overall plan. she agrees with it. she will call with any problems that may develop before the next visit here. Total time spent: 30 mins including face to face time and time spent for planning, charting and  co-ordination of care   Tamsen Meek, MD 05/07/23

## 2023-05-07 NOTE — Therapy (Signed)
OUTPATIENT PHYSICAL THERAPY SOZO SCREENING NOTE   Patient Name: Sydney Key MRN: [id number] DOB:[birth year], 89 y.o., female Today's Date: 05/07/2023  PCP: Lindwood Qua, MD REFERRING PROVIDER: Harriette Bouillon, MD   PT End of Session - 05/07/23 1016     Visit Number 2   unchanged due to screen only   PT Start Time 1007    PT Stop Time 1015    PT Time Calculation (min) 8 min    Activity Tolerance Patient tolerated treatment well    Behavior During Therapy [employer] [facility] for tasks assessed/performed              Past Medical History:  Diagnosis Date   Anxiety    Cancer (HCC) 2023   Right breast cancer   History of kidney stones    Past Surgical History:  Procedure Laterality Date   ABDOMINAL HYSTERECTOMY  age 66   COLONOSCOPY N/A 05/14/2013   Procedure: COLONOSCOPY;  Surgeon: Florencia Reasons, MD;  Location: [facility];  Service: Endoscopy;  Laterality: N/A;   EYE SURGERY Left 07/09/2011   retina detachment   FOOT SURGERY Right    "shaved down" bone on Great toe   HOT HEMOSTASIS N/A 05/14/2013   Procedure: HOT HEMOSTASIS (ARGON PLASMA COAGULATION/BICAP);  Surgeon: Florencia Reasons, MD;  Location: [facility];  Service: Endoscopy;  Laterality: N/A;   PORTACATH PLACEMENT Right 01/04/2022   Procedure: PORT PLACEMENT WITH ULTRASOUND GUIDANCE;  Surgeon: Harriette Bouillon, MD;  Location: [facility];  Service: General;  Laterality: Right;   SIMPLE MASTECTOMY WITH AXILLARY SENTINEL NODE BIOPSY Right 03/27/2022   Procedure: RIGHT SIMPLE MASTECTOMY WITH RIGHT SENTINEL LYMPH NODE BIOPSY;  Surgeon: Harriette Bouillon, MD;  Location: [facility];  Service: General;  Laterality: Right;   Patient Active Problem List   Diagnosis Date Noted   Hypercholesteremia 05/20/2022   Vitamin B12 deficiency 05/20/2022   Port-A-Cath in place 01/09/2022   Genetic testing 12/21/2021   Family history of breast cancer 12/06/2021   Malignant neoplasm of lower-inner quadrant of right breast of  female, estrogen receptor negative (HCC) 12/04/2021   Resides in long term care facility 08/18/2020   Closed subcapital fracture of neck of femur, left, initial encounter (HCC) 06/06/2020   SBO (small bowel obstruction) (HCC) 04/08/2020   Chronic right shoulder pain 01/31/2020   Glaucoma of both eyes 01/31/2020   Restless legs syndrome 01/25/2019   Skin lesion of left leg 01/25/2019   Bilateral hearing loss 10/02/2015   Osteopenia after menopause 06/08/2014   GERD (gastroesophageal reflux disease) 01/25/2013   Chronic low back pain 01/24/2013   Colon polyps 01/24/2013    REFERRING DIAG: right breast cancer at risk for lymphedema  THERAPY DIAG: Malignant neoplasm of upper-inner quadrant of right breast in female, estrogen receptor negative (HCC)  PERTINENT HISTORY: Patient was diagnosed on 11/19/2021 with right 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 It measures 2.6 cm and is located in the upper inner quadrant. It is ER/PR negative and HER2 positive with a Ki67 of 98%. She had a left hip fracture in 05/2020 which required surgery and had a right foot bunion surgery in 08/2021.   PRECAUTIONS: right UE Lymphedema risk,   SUBJECTIVE: Here for 3 month SOZO screen. I have a swollen knee. I don't have any issues with my arms just my feet and legs.   PAIN:  Are you having pain? Yes: NPRS scale: 7/10 Pain location: R hip Pain description: just hurts Aggravating factors: siting Relieving factors: moving around  SOZO SCREENING: Patient was assessed today using the SOZO machine to determine the lymphedema index score. This was compared to her baseline score. It was determined that she is within the recommended range when compared to her baseline and no further action is needed at this time. She will continue SOZO screenings. These are done every 3 months for 2 years post operatively followed by every 6 months for 2 years, and then annually.   [employer], PT 05/07/2023,  10:17 AM

## 2023-05-07 NOTE — Assessment & Plan Note (Signed)
11/28/2021:Palpable right breast lump 2.6 cm with calcifications at 3 o'clock position, additional 1.3 cm lesion is an intramammary lymph node biopsy: Benign, axilla negative, biopsy of the breast lump: Grade 3 IDC ER 0%, PR 0%, Ki-67 98%, HER2 3+ positive   Treatment plan: 1.  Neoadjuvant chemotherapy with Taxol Herceptin weekly x12 followed by Kadcyla maintenance completed 11/05/2022 2. Rt Mastectomy with sentinel lymph node biopsy: 03/27/2022: 4.2 cm grade 3 IDC with negative margins 0/2 lymph nodes negative, ER 0%, PR 0%, Ki-67 80%, HER2 3+ positive 3.  Adjuvant radiation completed 06/13/2022 ----------------------------------------------------------------------------------------------------------------- Current treatment: Surveillance   Breast cancer surveillance: Breast exam 05/07/2023: Benign Mammogram 03/03/2023: Benign at [facility], density category B Return to clinic in 1 year for follow-up

## 2023-05-08 ENCOUNTER — Other Ambulatory Visit: Payer: Self-pay

## 2023-05-13 DIAGNOSIS — M7041 Prepatellar bursitis, right knee: Secondary | ICD-10-CM | POA: Diagnosis not present

## 2023-05-13 DIAGNOSIS — M5416 Radiculopathy, lumbar region: Secondary | ICD-10-CM | POA: Diagnosis not present

## 2023-05-13 DIAGNOSIS — M1711 Unilateral primary osteoarthritis, right knee: Secondary | ICD-10-CM | POA: Diagnosis not present

## 2023-05-16 DIAGNOSIS — M545 Low back pain, unspecified: Secondary | ICD-10-CM | POA: Diagnosis not present

## 2023-05-16 DIAGNOSIS — Z96642 Presence of left artificial hip joint: Secondary | ICD-10-CM | POA: Diagnosis not present

## 2023-05-16 DIAGNOSIS — R293 Abnormal posture: Secondary | ICD-10-CM | POA: Diagnosis not present

## 2023-05-16 DIAGNOSIS — G8929 Other chronic pain: Secondary | ICD-10-CM | POA: Diagnosis not present

## 2023-05-16 DIAGNOSIS — R2681 Unsteadiness on feet: Secondary | ICD-10-CM | POA: Diagnosis not present

## 2023-05-16 DIAGNOSIS — M25551 Pain in right hip: Secondary | ICD-10-CM | POA: Diagnosis not present

## 2023-05-19 DIAGNOSIS — H401123 Primary open-angle glaucoma, left eye, severe stage: Secondary | ICD-10-CM | POA: Diagnosis not present

## 2023-05-19 DIAGNOSIS — H5213 Myopia, bilateral: Secondary | ICD-10-CM | POA: Diagnosis not present

## 2023-05-20 DIAGNOSIS — R2681 Unsteadiness on feet: Secondary | ICD-10-CM | POA: Diagnosis not present

## 2023-05-20 DIAGNOSIS — G8929 Other chronic pain: Secondary | ICD-10-CM | POA: Diagnosis not present

## 2023-05-20 DIAGNOSIS — M545 Low back pain, unspecified: Secondary | ICD-10-CM | POA: Diagnosis not present

## 2023-05-20 DIAGNOSIS — M25551 Pain in right hip: Secondary | ICD-10-CM | POA: Diagnosis not present

## 2023-05-20 DIAGNOSIS — Z96642 Presence of left artificial hip joint: Secondary | ICD-10-CM | POA: Diagnosis not present

## 2023-05-20 DIAGNOSIS — R293 Abnormal posture: Secondary | ICD-10-CM | POA: Diagnosis not present

## 2023-05-22 DIAGNOSIS — M545 Low back pain, unspecified: Secondary | ICD-10-CM | POA: Diagnosis not present

## 2023-05-22 DIAGNOSIS — R2681 Unsteadiness on feet: Secondary | ICD-10-CM | POA: Diagnosis not present

## 2023-05-22 DIAGNOSIS — Z96642 Presence of left artificial hip joint: Secondary | ICD-10-CM | POA: Diagnosis not present

## 2023-05-22 DIAGNOSIS — R293 Abnormal posture: Secondary | ICD-10-CM | POA: Diagnosis not present

## 2023-05-22 DIAGNOSIS — G8929 Other chronic pain: Secondary | ICD-10-CM | POA: Diagnosis not present

## 2023-05-22 DIAGNOSIS — M25551 Pain in right hip: Secondary | ICD-10-CM | POA: Diagnosis not present

## 2023-05-27 DIAGNOSIS — R2681 Unsteadiness on feet: Secondary | ICD-10-CM | POA: Diagnosis not present

## 2023-05-27 DIAGNOSIS — M545 Low back pain, unspecified: Secondary | ICD-10-CM | POA: Diagnosis not present

## 2023-05-27 DIAGNOSIS — Z96642 Presence of left artificial hip joint: Secondary | ICD-10-CM | POA: Diagnosis not present

## 2023-05-27 DIAGNOSIS — M25551 Pain in right hip: Secondary | ICD-10-CM | POA: Diagnosis not present

## 2023-05-27 DIAGNOSIS — R293 Abnormal posture: Secondary | ICD-10-CM | POA: Diagnosis not present

## 2023-05-27 DIAGNOSIS — G8929 Other chronic pain: Secondary | ICD-10-CM | POA: Diagnosis not present

## 2023-05-29 DIAGNOSIS — Z96642 Presence of left artificial hip joint: Secondary | ICD-10-CM | POA: Diagnosis not present

## 2023-05-29 DIAGNOSIS — R293 Abnormal posture: Secondary | ICD-10-CM | POA: Diagnosis not present

## 2023-05-29 DIAGNOSIS — R2681 Unsteadiness on feet: Secondary | ICD-10-CM | POA: Diagnosis not present

## 2023-05-29 DIAGNOSIS — G8929 Other chronic pain: Secondary | ICD-10-CM | POA: Diagnosis not present

## 2023-05-29 DIAGNOSIS — M545 Low back pain, unspecified: Secondary | ICD-10-CM | POA: Diagnosis not present

## 2023-05-29 DIAGNOSIS — M25551 Pain in right hip: Secondary | ICD-10-CM | POA: Diagnosis not present

## 2023-05-30 DIAGNOSIS — E538 Deficiency of other specified B group vitamins: Secondary | ICD-10-CM | POA: Diagnosis not present

## 2023-06-03 DIAGNOSIS — M545 Low back pain, unspecified: Secondary | ICD-10-CM | POA: Diagnosis not present

## 2023-06-03 DIAGNOSIS — R2681 Unsteadiness on feet: Secondary | ICD-10-CM | POA: Diagnosis not present

## 2023-06-03 DIAGNOSIS — Z96642 Presence of left artificial hip joint: Secondary | ICD-10-CM | POA: Diagnosis not present

## 2023-06-03 DIAGNOSIS — R293 Abnormal posture: Secondary | ICD-10-CM | POA: Diagnosis not present

## 2023-06-03 DIAGNOSIS — G8929 Other chronic pain: Secondary | ICD-10-CM | POA: Diagnosis not present

## 2023-06-03 DIAGNOSIS — M25551 Pain in right hip: Secondary | ICD-10-CM | POA: Diagnosis not present

## 2023-06-10 DIAGNOSIS — Z96642 Presence of left artificial hip joint: Secondary | ICD-10-CM | POA: Diagnosis not present

## 2023-06-10 DIAGNOSIS — R293 Abnormal posture: Secondary | ICD-10-CM | POA: Diagnosis not present

## 2023-06-10 DIAGNOSIS — G8929 Other chronic pain: Secondary | ICD-10-CM | POA: Diagnosis not present

## 2023-06-10 DIAGNOSIS — R2681 Unsteadiness on feet: Secondary | ICD-10-CM | POA: Diagnosis not present

## 2023-06-10 DIAGNOSIS — M545 Low back pain, unspecified: Secondary | ICD-10-CM | POA: Diagnosis not present

## 2023-06-10 DIAGNOSIS — M25551 Pain in right hip: Secondary | ICD-10-CM | POA: Diagnosis not present

## 2023-06-13 DIAGNOSIS — M545 Low back pain, unspecified: Secondary | ICD-10-CM | POA: Diagnosis not present

## 2023-06-13 DIAGNOSIS — Z96642 Presence of left artificial hip joint: Secondary | ICD-10-CM | POA: Diagnosis not present

## 2023-06-13 DIAGNOSIS — M25551 Pain in right hip: Secondary | ICD-10-CM | POA: Diagnosis not present

## 2023-06-13 DIAGNOSIS — R2681 Unsteadiness on feet: Secondary | ICD-10-CM | POA: Diagnosis not present

## 2023-06-13 DIAGNOSIS — R293 Abnormal posture: Secondary | ICD-10-CM | POA: Diagnosis not present

## 2023-06-13 DIAGNOSIS — G8929 Other chronic pain: Secondary | ICD-10-CM | POA: Diagnosis not present

## 2023-06-16 DIAGNOSIS — Z96642 Presence of left artificial hip joint: Secondary | ICD-10-CM | POA: Diagnosis not present

## 2023-06-16 DIAGNOSIS — M545 Low back pain, unspecified: Secondary | ICD-10-CM | POA: Diagnosis not present

## 2023-06-16 DIAGNOSIS — R2681 Unsteadiness on feet: Secondary | ICD-10-CM | POA: Diagnosis not present

## 2023-06-16 DIAGNOSIS — M25551 Pain in right hip: Secondary | ICD-10-CM | POA: Diagnosis not present

## 2023-06-16 DIAGNOSIS — R293 Abnormal posture: Secondary | ICD-10-CM | POA: Diagnosis not present

## 2023-06-16 DIAGNOSIS — G8929 Other chronic pain: Secondary | ICD-10-CM | POA: Diagnosis not present

## 2023-06-20 DIAGNOSIS — G8929 Other chronic pain: Secondary | ICD-10-CM | POA: Diagnosis not present

## 2023-06-20 DIAGNOSIS — R293 Abnormal posture: Secondary | ICD-10-CM | POA: Diagnosis not present

## 2023-06-20 DIAGNOSIS — M25551 Pain in right hip: Secondary | ICD-10-CM | POA: Diagnosis not present

## 2023-06-20 DIAGNOSIS — Z96642 Presence of left artificial hip joint: Secondary | ICD-10-CM | POA: Diagnosis not present

## 2023-06-20 DIAGNOSIS — M545 Low back pain, unspecified: Secondary | ICD-10-CM | POA: Diagnosis not present

## 2023-06-20 DIAGNOSIS — R2681 Unsteadiness on feet: Secondary | ICD-10-CM | POA: Diagnosis not present

## 2023-06-23 DIAGNOSIS — M545 Low back pain, unspecified: Secondary | ICD-10-CM | POA: Diagnosis not present

## 2023-06-23 DIAGNOSIS — R2681 Unsteadiness on feet: Secondary | ICD-10-CM | POA: Diagnosis not present

## 2023-06-23 DIAGNOSIS — G8929 Other chronic pain: Secondary | ICD-10-CM | POA: Diagnosis not present

## 2023-06-23 DIAGNOSIS — M25551 Pain in right hip: Secondary | ICD-10-CM | POA: Diagnosis not present

## 2023-06-23 DIAGNOSIS — Z96642 Presence of left artificial hip joint: Secondary | ICD-10-CM | POA: Diagnosis not present

## 2023-06-23 DIAGNOSIS — R293 Abnormal posture: Secondary | ICD-10-CM | POA: Diagnosis not present

## 2023-06-27 DIAGNOSIS — M25551 Pain in right hip: Secondary | ICD-10-CM | POA: Diagnosis not present

## 2023-06-27 DIAGNOSIS — G8929 Other chronic pain: Secondary | ICD-10-CM | POA: Diagnosis not present

## 2023-06-27 DIAGNOSIS — R2681 Unsteadiness on feet: Secondary | ICD-10-CM | POA: Diagnosis not present

## 2023-06-27 DIAGNOSIS — R293 Abnormal posture: Secondary | ICD-10-CM | POA: Diagnosis not present

## 2023-06-27 DIAGNOSIS — M545 Low back pain, unspecified: Secondary | ICD-10-CM | POA: Diagnosis not present

## 2023-06-27 DIAGNOSIS — Z96642 Presence of left artificial hip joint: Secondary | ICD-10-CM | POA: Diagnosis not present

## 2023-07-04 DIAGNOSIS — Z96642 Presence of left artificial hip joint: Secondary | ICD-10-CM | POA: Diagnosis not present

## 2023-07-04 DIAGNOSIS — M545 Low back pain, unspecified: Secondary | ICD-10-CM | POA: Diagnosis not present

## 2023-07-04 DIAGNOSIS — R293 Abnormal posture: Secondary | ICD-10-CM | POA: Diagnosis not present

## 2023-07-04 DIAGNOSIS — M25551 Pain in right hip: Secondary | ICD-10-CM | POA: Diagnosis not present

## 2023-07-04 DIAGNOSIS — E538 Deficiency of other specified B group vitamins: Secondary | ICD-10-CM | POA: Diagnosis not present

## 2023-07-04 DIAGNOSIS — G8929 Other chronic pain: Secondary | ICD-10-CM | POA: Diagnosis not present

## 2023-07-04 DIAGNOSIS — R2681 Unsteadiness on feet: Secondary | ICD-10-CM | POA: Diagnosis not present

## 2023-07-14 ENCOUNTER — Ambulatory Visit: Payer: Self-pay

## 2023-08-04 ENCOUNTER — Ambulatory Visit: Payer: Medicare Other | Attending: Surgery

## 2023-08-04 VITALS — Wt 134.0 lb

## 2023-08-04 DIAGNOSIS — Z483 Aftercare following surgery for neoplasm: Secondary | ICD-10-CM | POA: Insufficient documentation

## 2023-08-04 NOTE — Therapy (Signed)
OUTPATIENT PHYSICAL THERAPY SOZO SCREENING NOTE   Patient Name: Sydney Key MRN: [id number] DOB:Jan 05, 1931, 89 y.o., female Today's Date: 08/04/2023  PCP: Lindwood Qua, MD REFERRING PROVIDER: Harriette Bouillon, MD   PT End of Session - 08/04/23 1605     Visit Number 2   # unchanged due to screen only   PT Start Time 1603    PT Stop Time 1608    PT Time Calculation (min) 5 min    Activity Tolerance Patient tolerated treatment well    Behavior During Therapy [facility] for tasks assessed/performed              Past Medical History:  Diagnosis Date   Anxiety    Cancer (HCC) 2023   Right breast cancer   History of kidney stones    Past Surgical History:  Procedure Laterality Date   ABDOMINAL HYSTERECTOMY  age 27   COLONOSCOPY N/A 05/14/2013   Procedure: COLONOSCOPY;  Surgeon: Florencia Reasons, MD;  Location: [facility];  Service: Endoscopy;  Laterality: N/A;   EYE SURGERY Left 07/09/2011   retina detachment   FOOT SURGERY Right    "shaved down" bone on Great toe   HOT HEMOSTASIS N/A 05/14/2013   Procedure: HOT HEMOSTASIS (ARGON PLASMA COAGULATION/BICAP);  Surgeon: Florencia Reasons, MD;  Location: [facility];  Service: Endoscopy;  Laterality: N/A;   PORTACATH PLACEMENT Right 01/04/2022   Procedure: PORT PLACEMENT WITH ULTRASOUND GUIDANCE;  Surgeon: Harriette Bouillon, MD;  Location: [facility];  Service: General;  Laterality: Right;   SIMPLE MASTECTOMY WITH AXILLARY SENTINEL NODE BIOPSY Right 03/27/2022   Procedure: RIGHT SIMPLE MASTECTOMY WITH RIGHT SENTINEL LYMPH NODE BIOPSY;  Surgeon: Harriette Bouillon, MD;  Location: [facility];  Service: General;  Laterality: Right;   Patient Active Problem List   Diagnosis Date Noted   Hypercholesteremia 05/20/2022   Vitamin B12 deficiency 05/20/2022   Port-A-Cath in place 01/09/2022   Genetic testing 12/21/2021   Family history of breast cancer 12/06/2021   Malignant neoplasm of lower-inner quadrant of right breast of  female, estrogen receptor negative (HCC) 12/04/2021   Resides in long term care facility 08/18/2020   Closed subcapital fracture of neck of femur, left, initial encounter (HCC) 06/06/2020   SBO (small bowel obstruction) (HCC) 04/08/2020   Chronic right shoulder pain 01/31/2020   Glaucoma of both eyes 01/31/2020   Restless legs syndrome 01/25/2019   Skin lesion of left leg 01/25/2019   Bilateral hearing loss 10/02/2015   Osteopenia after menopause 06/08/2014   GERD (gastroesophageal reflux disease) 01/25/2013   Chronic low back pain 01/24/2013   Colon polyps 01/24/2013    REFERRING DIAG: right breast cancer at risk for lymphedema  THERAPY DIAG: Aftercare following surgery for neoplasm  PERTINENT HISTORY: Patient was diagnosed on 11/19/2021 with right 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 It measures 2.6 cm and is located in the upper inner quadrant. It is ER/PR negative and HER2 positive with a Ki67 of 98%. Rt mastectomy with SLNB on 04/19/2022. She had a left hip fracture in 05/2020 which required surgery and had a right foot bunion surgery in 08/2021.   PRECAUTIONS: right UE Lymphedema risk,   SUBJECTIVE: Here for 3 month SOZO screen. "My hip still hurts as much as much as it did last time I was here."   PAIN:  Are you having pain? Yes: NPRS scale: 6-7/10 Pain location: R hip Pain description: just hurts Aggravating factors: siting Relieving factors: moving around  SOZO SCREENING: Patient was  assessed today using the SOZO machine to determine the lymphedema index score. This was compared to her baseline score. It was determined that she is within the recommended range when compared to her baseline and no further action is needed at this time. She will continue SOZO screenings. These are done every 3 months for 2 years post operatively followed by every 6 months for 2 years, and then annually.   L-DEX FLOWSHEETS - 08/04/23 1600       L-DEX LYMPHEDEMA SCREENING    Measurement Type Unilateral    L-DEX MEASUREMENT EXTREMITY Upper Extremity    POSITION  Standing    DOMINANT SIDE Right    At Risk Side Right    BASELINE SCORE (UNILATERAL) -0.1    L-DEX SCORE (UNILATERAL) 2.5    VALUE CHANGE (UNILAT) 2.6              Hermenia Bers, PTA 08/04/2023, 4:07 PM

## 2023-11-03 ENCOUNTER — Ambulatory Visit: Payer: Medicare Other | Attending: Surgery

## 2023-11-03 VITALS — Wt 132.2 lb

## 2023-11-03 DIAGNOSIS — Z483 Aftercare following surgery for neoplasm: Secondary | ICD-10-CM | POA: Insufficient documentation

## 2023-11-03 NOTE — Therapy (Signed)
 OUTPATIENT PHYSICAL THERAPY [facility]ZO SCREENING NOTE   Patient Name: Sydney Key MRN: [id number] DOB:1930/11/30, 89 y.o., female Today's Date: 11/03/2023  PCP: Darylene Epley, MD REFERRING PROVIDER: Sim Dryer, MD   PT End of Session - 11/03/23 1542     Visit Number 2   # unchanged due to screen only   PT Start Time 1538    PT Stop Time 1545    PT Time Calculation (min) 7 min    Activity Tolerance Patient tolerated treatment well    Behavior During Therapy [facility] for tasks assessed/performed              Past Medical History:  Diagnosis Date   Anxiety    Cancer (HCC) 2023   Right breast cancer   History of kidney stones    Past Surgical History:  Procedure Laterality Date   ABDOMINAL HYSTERECTOMY  age 33   COLONOSCOPY N/A 05/14/2013   Procedure: COLONOSCOPY;  Surgeon: Brice Campi, MD;  Location: [facility];  Service: Endoscopy;  Laterality: N/A;   EYE SURGERY Left 07/09/2011   retina detachment   FOOT SURGERY Right    "shaved down" bone on Great toe   HOT HEMOSTASIS N/A 05/14/2013   Procedure: HOT HEMOSTASIS (ARGON PLASMA COAGULATION/BICAP);  Surgeon: Brice Campi, MD;  Location: [facility];  Service: Endoscopy;  Laterality: N/A;   PORTACATH PLACEMENT Right 01/04/2022   Procedure: PORT PLACEMENT WITH ULTRASOUND GUIDANCE;  Surgeon: Sim Dryer, MD;  Location: [facility];  Service: General;  Laterality: Right;   SIMPLE MASTECTOMY WITH AXILLARY SENTINEL NODE BIOPSY Right 03/27/2022   Procedure: RIGHT SIMPLE MASTECTOMY WITH RIGHT SENTINEL LYMPH NODE BIOPSY;  Surgeon: Sim Dryer, MD;  Location: [facility];  Service: General;  Laterality: Right;   Patient Active Problem List   Diagnosis Date Noted   Hypercholesteremia 05/20/2022   Vitamin B12 deficiency 05/20/2022   Port-A-Cath in place 01/09/2022   Genetic testing 12/21/2021   Family history of breast cancer 12/06/2021   Malignant neoplasm of lower-inner quadrant of right breast of  female, estrogen receptor negative (HCC) 12/04/2021   Resides in long term care facility 08/18/2020   Closed subcapital fracture of neck of femur, left, initial encounter (HCC) 06/06/2020   SBO (small bowel obstruction) (HCC) 04/08/2020   Chronic right shoulder pain 01/31/2020   Glaucoma of both eyes 01/31/2020   Restless legs syndrome 01/25/2019   Skin lesion of left leg 01/25/2019   Bilateral hearing loss 10/02/2015   Osteopenia after menopause 06/08/2014   GERD (gastroesophageal reflux disease) 01/25/2013   Chronic low back pain 01/24/2013   Colon polyps 01/24/2013    REFERRING DIAG: right breast cancer at risk for lymphedema  THERAPY DIAG: Aftercare following surgery for neoplasm  PERTINENT HISTORY: Patient was diagnosed on 11/19/2021 with right 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 It measures 2.6 cm and is located in the upper inner quadrant. It is ER/PR negative and HER2 positive with a Ki67 of 98%. Rt mastectomy with SLNB on 04/19/2022. She had a left hip fracture in 05/2020 which required surgery and had a right foot bunion surgery in 08/2021.   PRECAUTIONS: right UE Lymphedema risk,   SUBJECTIVE: Here for 3 month SOZO screen. "I'm doing good with everything from my breast surgery. My hip is still really hurting me. They are giving me an injection in my SI on May 12 which is where they think my pain is coming from."    SOZO SCREENING: Patient was assessed today using  the SOZO machine to determine the lymphedema index score. This was compared to her baseline score. It was determined that she is within the recommended range when compared to her baseline and no further action is needed at this time. She will continue SOZO screenings. These are done every 3 months for 2 years post operatively followed by every 6 months for 2 years, and then annually.   L-DEX FLOWSHEETS - 11/03/23 1500       L-DEX LYMPHEDEMA SCREENING   Measurement Type Unilateral    L-DEX MEASUREMENT  EXTREMITY Upper Extremity    POSITION  Standing    DOMINANT SIDE Right    At Risk Side Right    BASELINE SCORE (UNILATERAL) -0.1    L-DEX SCORE (UNILATERAL) -0.4    VALUE CHANGE (UNILAT) -0.3              Denyce Flank, PTA 11/03/2023, 3:44 PM

## 2024-02-02 ENCOUNTER — Ambulatory Visit: Attending: Surgery

## 2024-02-02 VITALS — Wt 133.0 lb

## 2024-02-02 DIAGNOSIS — Z483 Aftercare following surgery for neoplasm: Secondary | ICD-10-CM | POA: Insufficient documentation

## 2024-02-02 NOTE — Therapy (Signed)
 OUTPATIENT PHYSICAL THERAPY SOZO SCREENING NOTE   Patient Name: Sydney Key MRN: [id number] DOB:Apr 10, 1931, 89 y.o., female Today's Date: 02/02/2024  PCP: Rosan Mix, MD REFERRING PROVIDER: Vanderbilt Ned, MD   PT End of Session - 02/02/24 1534     Visit Number 2   # unchanged due to screen only   PT Start Time 1532    PT Stop Time 1536    PT Time Calculation (min) 4 min    Activity Tolerance Patient tolerated treatment well    Behavior During Therapy [facility] for tasks assessed/performed           Past Medical History:  Diagnosis Date   Anxiety    Cancer (HCC) 2023   Right breast cancer   History of kidney stones    Past Surgical History:  Procedure Laterality Date   ABDOMINAL HYSTERECTOMY  age 25   COLONOSCOPY N/A 05/14/2013   Procedure: COLONOSCOPY;  Surgeon: Lamar LULLA Bunk, MD;  Location: [facility];  Service: Endoscopy;  Laterality: N/A;   EYE SURGERY Left 07/09/2011   retina detachment   FOOT SURGERY Right    shaved down bone on Great toe   HOT HEMOSTASIS N/A 05/14/2013   Procedure: HOT HEMOSTASIS (ARGON PLASMA COAGULATION/BICAP);  Surgeon: Lamar LULLA Bunk, MD;  Location: [facility];  Service: Endoscopy;  Laterality: N/A;   PORTACATH PLACEMENT Right 01/04/2022   Procedure: PORT PLACEMENT WITH ULTRASOUND GUIDANCE;  Surgeon: Vanderbilt Ned, MD;  Location: [facility];  Service: General;  Laterality: Right;   SIMPLE MASTECTOMY WITH AXILLARY SENTINEL NODE BIOPSY Right 03/27/2022   Procedure: RIGHT SIMPLE MASTECTOMY WITH RIGHT SENTINEL LYMPH NODE BIOPSY;  Surgeon: Vanderbilt Ned, MD;  Location: [facility];  Service: General;  Laterality: Right;   Patient Active Problem List   Diagnosis Date Noted   Hypercholesteremia 05/20/2022   Vitamin B12 deficiency 05/20/2022   Port-A-Cath in place 01/09/2022   Genetic testing 12/21/2021   Family history of breast cancer 12/06/2021   Malignant neoplasm of lower-inner quadrant of right breast of female,  estrogen receptor negative (HCC) 12/04/2021   Resides in long term care facility 08/18/2020   Closed subcapital fracture of neck of femur, left, initial encounter (HCC) 06/06/2020   SBO (small bowel obstruction) (HCC) 04/08/2020   Chronic right shoulder pain 01/31/2020   Glaucoma of both eyes 01/31/2020   Restless legs syndrome 01/25/2019   Skin lesion of left leg 01/25/2019   Bilateral hearing loss 10/02/2015   Osteopenia after menopause 06/08/2014   GERD (gastroesophageal reflux disease) 01/25/2013   Chronic low back pain 01/24/2013   Colon polyps 01/24/2013    REFERRING DIAG: right breast cancer at risk for lymphedema  THERAPY DIAG: Aftercare following surgery for neoplasm  PERTINENT HISTORY: Patient was diagnosed on 11/19/2021 with right 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 It measures 2.6 cm and is located in the upper inner quadrant. It is ER/PR negative and HER2 positive with a Ki67 of 98%. Rt mastectomy with SLNB on 04/19/2022. She had a left hip fracture in 05/2020 which required surgery and had a right foot bunion surgery in 08/2021.   PRECAUTIONS: right UE Lymphedema risk,   SUBJECTIVE: Here for 3 month SOZO screen.    SOZO SCREENING: Patient was assessed today using the SOZO machine to determine the lymphedema index score. This was compared to her baseline score. It was determined that she is within the recommended range when compared to her baseline and no further action is needed at this time. She  will continue SOZO screenings. These are done every 3 months for 2 years post operatively followed by every 6 months for 2 years, and then annually.   L-DEX FLOWSHEETS - 02/02/24 1500       L-DEX LYMPHEDEMA SCREENING   Measurement Type Unilateral    L-DEX MEASUREMENT EXTREMITY Upper Extremity    POSITION  Standing    DOMINANT SIDE Right    At Risk Side Right    BASELINE SCORE (UNILATERAL) -0.1    L-DEX SCORE (UNILATERAL) -1.1    VALUE CHANGE (UNILAT) -1            Aden Berwyn Caldron, PTA 02/02/2024, 3:36 PM

## 2024-03-18 ENCOUNTER — Encounter: Payer: Self-pay | Admitting: Adult Health

## 2024-04-16 ENCOUNTER — Other Ambulatory Visit: Payer: Self-pay

## 2024-04-19 ENCOUNTER — Ambulatory Visit

## 2024-05-03 ENCOUNTER — Ambulatory Visit

## 2024-05-13 ENCOUNTER — Inpatient Hospital Stay: Payer: Medicare Other | Attending: Hematology and Oncology | Admitting: Hematology and Oncology

## 2024-05-13 VITALS — BP 129/55 | HR 62 | Temp 97.3°F | Resp 16 | Wt 136.5 lb

## 2024-05-13 DIAGNOSIS — Z171 Estrogen receptor negative status [ER-]: Secondary | ICD-10-CM | POA: Insufficient documentation

## 2024-05-13 DIAGNOSIS — C50311 Malignant neoplasm of lower-inner quadrant of right female breast: Secondary | ICD-10-CM | POA: Diagnosis not present

## 2024-05-13 DIAGNOSIS — Z923 Personal history of irradiation: Secondary | ICD-10-CM | POA: Diagnosis not present

## 2024-05-13 DIAGNOSIS — M25559 Pain in unspecified hip: Secondary | ICD-10-CM | POA: Diagnosis not present

## 2024-05-13 DIAGNOSIS — Z9011 Acquired absence of right breast and nipple: Secondary | ICD-10-CM | POA: Diagnosis not present

## 2024-05-13 DIAGNOSIS — Z9221 Personal history of antineoplastic chemotherapy: Secondary | ICD-10-CM | POA: Diagnosis not present

## 2024-05-13 DIAGNOSIS — M1990 Unspecified osteoarthritis, unspecified site: Secondary | ICD-10-CM | POA: Diagnosis not present

## 2024-05-13 NOTE — Progress Notes (Signed)
 Patient Care Team: Rosan Mix, MD as PCP - General (Internal Medicine) Vanderbilt Ned, MD as Consulting Physician (General Surgery) Odean Potts, MD as Consulting Physician (Hematology and Oncology) Izell Domino, MD as Attending Physician (Radiation Oncology)  DIAGNOSIS:  Encounter Diagnosis  Name Primary?   Malignant neoplasm of lower-inner quadrant of right breast of female, estrogen receptor negative (HCC) Yes    SUMMARY OF ONCOLOGIC HISTORY: Oncology History  Malignant neoplasm of lower-inner quadrant of right breast of female, estrogen receptor negative (HCC)  11/28/2021 Initial Diagnosis   Palpable right breast lump 2.6 cm with calcifications at 3 o'clock position, additional 1.3 cm lesion is an intramammary lymph node biopsy: Benign, axilla negative, biopsy of the breast lump: Grade 3 IDC ER 0%, PR 0%, Ki-67 98%, HER2 3+ positive   12/05/2021 Cancer Staging   Staging form: Breast, AJCC 8th Edition - Clinical: Stage IIA (cT2, cN0, cM0, G3, ER-, PR-, HER2+) - Signed by Odean Potts, MD on 12/05/2021 Stage prefix: Initial diagnosis Histologic grading system: 3 grade system   12/26/2021 - 03/12/2022 Chemotherapy   Patient is on Treatment Plan : BREAST Paclitaxel  + Trastuzumab  q7d / Trastuzumab  q21d      Genetic Testing   [facility] CustomNext Panel was Negative. Report date is 12/18/2021.  The CustomNext-Cancer+RNAinsight panel offered by [employer] [facility] includes sequencing and rearrangement analysis for the following 47 genes:  APC, ATM, AXIN2, BARD1, BMPR1A, BRCA1, BRCA2, BRIP1, CDH1, CDK4, CDKN2A, CHEK2, CTNNA1, DICER1, EPCAM, GREM1, HOXB13, KIT, MEN1, MLH1, MSH2, MSH3, MSH6, MUTYH, NBN, NF1, NTHL1, PALB2, PDGFRA, PMS2, POLD1, POLE, PTEN, RAD50, RAD51C, RAD51D, SDHA, SDHB, SDHC, SDHD, SMAD4, SMARCA4, STK11, TP53, TSC1, TSC2, and VHL.  RNA data is routinely analyzed for use in variant interpretation for all genes.   03/27/2022 Surgery   Right breast mastectomy: Invasive ductal  carcinoma Nottingham grade 3 with no morphologic evidence of neoadjuvant chemotherapy effect negative for lymphovascular and perineural invasion the size noted was 4.2 x 3.7 x 3.5 cm margins were negative and 2 lymph nodes biopsied were negative. Cancer staging: ypT2 pN0 pM0   04/08/2022 -  Chemotherapy   Patient is on Treatment Plan : BREAST ADO-Trastuzumab Emtansine  (Kadcyla ) q21d     05/06/2022 - 06/13/2022 Radiation Therapy   Adjuvant radiation therapy     CHIEF COMPLIANT:  HISTORY OF PRESENT ILLNESS:   History of Present Illness Sydney Key is a 89 year old female with a history of cancer who presents for a follow-up visit.  She experiences fatigue and increased tiredness with physical activity, which exacerbates her hip and leg pain. These symptoms have persisted for over two years and are worsened by exercise. She has a history of arthritis in her back, and physical therapy aggravated her symptoms, particularly in her back and hip. Her recent mammogram was performed a couple of months ago.     ALLERGIES:  is allergic to paclitaxel .  MEDICATIONS:  Current Outpatient Medications  Medication Sig Dispense Refill   acetaminophen  (TYLENOL ) 500 MG tablet Take 500 mg by mouth at bedtime.     albuterol (VENTOLIN HFA) 108 (90 Base) MCG/ACT inhaler Inhale into the lungs.     Ascorbic Acid (VITAMIN C WITH ROSE HIPS) 500 MG tablet Take 500 mg by mouth daily.     aspirin EC 81 MG tablet Take 81 mg by mouth every evening.     brimonidine  (ALPHAGAN ) 0.15 % ophthalmic solution Place 1 drop into the left eye 2 (two) times daily.     cholecalciferol (VITAMIN D) 1000  UNITS tablet Take 1,000 Units by mouth daily.     cyanocobalamin (,VITAMIN B-12,) 1000 MCG/ML injection Inject 1,000 mcg into the muscle every 30 (thirty) days.     escitalopram  (LEXAPRO ) 10 MG tablet Take 5 mg by mouth at bedtime.     gabapentin  (NEURONTIN ) 300 MG capsule Take 300 mg by mouth at bedtime.     Multiple Vitamin  (MULTIVITAMIN WITH MINERALS) TABS tablet Take 1 tablet by mouth daily.     No current facility-administered medications for this visit.    PHYSICAL EXAMINATION: ECOG PERFORMANCE STATUS: 1 - Symptomatic but completely ambulatory  Vitals:   05/13/24 1012  BP: (!) 129/55  Pulse: 62  Resp: 16  Temp: (!) 97.3 F (36.3 C)  SpO2: 99%   Filed Weights   05/13/24 1012  Weight: 136 lb 8 oz (61.9 kg)    Physical Exam BREAST: Breasts normal on examination.  (exam performed in the presence of a chaperone)  LABORATORY DATA:  I have reviewed the data as listed    Latest Ref Rng & Units 11/05/2022   10:35 AM 10/15/2022   10:01 AM 09/25/2022   10:49 AM  CMP  Glucose 70 - 99 mg/dL 94  98  95   BUN 8 - 23 mg/dL 20  20  19    Creatinine 0.44 - 1.00 mg/dL 9.32  9.31  9.35   Sodium 135 - 145 mmol/L 139  139  138   Potassium 3.5 - 5.1 mmol/L 3.8  4.2  4.1   Chloride 98 - 111 mmol/L 105  105  104   CO2 22 - 32 mmol/L 28  30  29    Calcium 8.9 - 10.3 mg/dL 9.7  9.4  9.6   Total Protein 6.5 - 8.1 g/dL 6.7  6.8  6.8   Total Bilirubin 0.3 - 1.2 mg/dL 1.0  0.9  0.9   Alkaline Phos 38 - 126 U/L 69  69  68   AST 15 - 41 U/L 35  32  33   ALT 0 - 44 U/L 26  25  25      Lab Results  Component Value Date   WBC 3.7 (L) 11/05/2022   HGB 11.4 (L) 11/05/2022   HCT 35.4 (L) 11/05/2022   MCV 94.1 11/05/2022   PLT 123 (L) 11/05/2022   NEUTROABS 2.0 11/05/2022    ASSESSMENT & PLAN:  Malignant neoplasm of lower-inner quadrant of right breast of female, estrogen receptor negative (HCC) 11/28/2021:Palpable right breast lump 2.6 cm with calcifications at 3 o'clock position, additional 1.3 cm lesion is an intramammary lymph node biopsy: Benign, axilla negative, biopsy of the breast lump: Grade 3 IDC ER 0%, PR 0%, Ki-67 98%, HER2 3+ positive   Treatment plan: 1.  Neoadjuvant chemotherapy with Taxol  Herceptin  weekly x12 followed by Kadcyla  maintenance completed 11/05/2022 2. Rt Mastectomy with sentinel lymph  node biopsy: 03/27/2022: 4.2 cm grade 3 IDC with negative margins 0/2 lymph nodes negative, ER 0%, PR 0%, Ki-67 80%, HER2 3+ positive 3.  Adjuvant radiation completed 06/13/2022 ----------------------------------------------------------------------------------------------------------------- Current treatment: Surveillance   Breast cancer surveillance: Breast exam 05/13/2024: Benign Mammogram 03/12/2024 at [facility]: Benign  density category B  Hip pain: This is her biggest complaint.  Unfortunately it is osteoarthritis related.  Physical therapy made it worse and therefore she does not want to do that anymore.  Return to clinic on an as-needed basis   No orders of the defined types were placed in this encounter.  The patient has a good  understanding of the overall plan. she agrees with it. she will call with any problems that may develop before the next visit here.  I personally spent a total of 30 minutes in the care of the patient today including preparing to see the patient, getting/reviewing separately obtained history, performing a medically appropriate exam/evaluation, counseling and educating, placing orders, referring and communicating with other health care professionals, documenting clinical information in the EHR, independently interpreting results, communicating results, and coordinating care.   Viinay K Makeisha Jentsch, MD 05/13/24

## 2024-05-13 NOTE — Assessment & Plan Note (Signed)
 11/28/2021:Palpable right breast lump 2.6 cm with calcifications at 3 o'clock position, additional 1.3 cm lesion is an intramammary lymph node biopsy: Benign, axilla negative, biopsy of the breast lump: Grade 3 IDC ER 0%, PR 0%, Ki-67 98%, HER2 3+ positive   Treatment plan: 1.  Neoadjuvant chemotherapy with Taxol  Herceptin  weekly x12 followed by Kadcyla  maintenance completed 11/05/2022 2. Rt Mastectomy with sentinel lymph node biopsy: 03/27/2022: 4.2 cm grade 3 IDC with negative margins 0/2 lymph nodes negative, ER 0%, PR 0%, Ki-67 80%, HER2 3+ positive 3.  Adjuvant radiation completed 06/13/2022 ----------------------------------------------------------------------------------------------------------------- Current treatment: Surveillance   Breast cancer surveillance: Breast exam 05/13/2024: Benign Mammogram 03/12/2024 at [facility]: Benign  density category B  Return to clinic in 1 year for follow-up

## 2024-05-31 ENCOUNTER — Ambulatory Visit: Attending: Surgery

## 2024-05-31 VITALS — Wt 135.2 lb

## 2024-05-31 DIAGNOSIS — Z483 Aftercare following surgery for neoplasm: Secondary | ICD-10-CM | POA: Insufficient documentation

## 2024-05-31 NOTE — Therapy (Signed)
 OUTPATIENT PHYSICAL THERAPY SOZO SCREENING NOTE   Patient Name: Sydney Key MRN: [id number] DOB:1931-05-31, 89 y.o., female Today's Date: 05/31/2024  PCP: Rosan Mix, MD REFERRING PROVIDER: Vanderbilt Ned, MD   PT End of Session - 05/31/24 1558     Visit Number 2   # unchanged due to screen only   PT Start Time 1557    PT Stop Time 1602    PT Time Calculation (min) 5 min    Activity Tolerance Patient tolerated treatment well    Behavior During Therapy [facility] for tasks assessed/performed           Past Medical History:  Diagnosis Date   Anxiety    Cancer (HCC) 2023   Right breast cancer   History of kidney stones    Past Surgical History:  Procedure Laterality Date   ABDOMINAL HYSTERECTOMY  age 74   COLONOSCOPY N/A 05/14/2013   Procedure: COLONOSCOPY;  Surgeon: Lamar LULLA Bunk, MD;  Location: [facility];  Service: Endoscopy;  Laterality: N/A;   EYE SURGERY Left 07/09/2011   retina detachment   FOOT SURGERY Right    shaved down bone on Great toe   HOT HEMOSTASIS N/A 05/14/2013   Procedure: HOT HEMOSTASIS (ARGON PLASMA COAGULATION/BICAP);  Surgeon: Lamar LULLA Bunk, MD;  Location: [facility];  Service: Endoscopy;  Laterality: N/A;   PORTACATH PLACEMENT Right 01/04/2022   Procedure: PORT PLACEMENT WITH ULTRASOUND GUIDANCE;  Surgeon: Vanderbilt Ned, MD;  Location: [facility];  Service: General;  Laterality: Right;   SIMPLE MASTECTOMY WITH AXILLARY SENTINEL NODE BIOPSY Right 03/27/2022   Procedure: RIGHT SIMPLE MASTECTOMY WITH RIGHT SENTINEL LYMPH NODE BIOPSY;  Surgeon: Vanderbilt Ned, MD;  Location: [facility];  Service: General;  Laterality: Right;   Patient Active Problem List   Diagnosis Date Noted   Hypercholesteremia 05/20/2022   Vitamin B12 deficiency 05/20/2022   Port-A-Cath in place 01/09/2022   Genetic testing 12/21/2021   Family history of breast cancer 12/06/2021   Malignant neoplasm of lower-inner quadrant of right breast of female,  estrogen receptor negative (HCC) 12/04/2021   Resides in long term care facility 08/18/2020   Closed subcapital fracture of neck of femur, left, initial encounter (HCC) 06/06/2020   SBO (small bowel obstruction) (HCC) 04/08/2020   Chronic right shoulder pain 01/31/2020   Glaucoma of both eyes 01/31/2020   Restless legs syndrome 01/25/2019   Skin lesion of left leg 01/25/2019   Bilateral hearing loss 10/02/2015   Osteopenia after menopause 06/08/2014   GERD (gastroesophageal reflux disease) 01/25/2013   Chronic low back pain 01/24/2013   Colon polyps 01/24/2013    REFERRING DIAG: right breast cancer at risk for lymphedema  THERAPY DIAG: Aftercare following surgery for neoplasm  PERTINENT HISTORY: Patient was diagnosed on 11/19/2021 with right 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 It measures 2.6 cm and is located in the upper inner quadrant. It is ER/PR negative and HER2 positive with a Ki67 of 98%. Rt mastectomy with SLNB on 04/19/2022. She had a left hip fracture in 05/2020 which required surgery and had a right foot bunion surgery in 08/2021.   PRECAUTIONS: right UE Lymphedema risk,   SUBJECTIVE: Here for last 3 month SOZO screen.    SOZO SCREENING: Patient was assessed today using the SOZO machine to determine the lymphedema index score. This was compared to her baseline score. It was determined that she is within the recommended range when compared to her baseline and no further action is needed at this time.  She will continue SOZO screenings. These are done every 3 months for 2 years post operatively followed by every 6 months for 2 years, and then annually.   L-DEX FLOWSHEETS - 05/31/24 1500       L-DEX LYMPHEDEMA SCREENING   Measurement Type Unilateral    L-DEX MEASUREMENT EXTREMITY Upper Extremity    POSITION  Standing    DOMINANT SIDE Right    At Risk Side Right    BASELINE SCORE (UNILATERAL) -0.1    L-DEX SCORE (UNILATERAL) 4.8    VALUE CHANGE (UNILAT) 4.9          P: Transition to 6 months until 04/2026.   Aden Berwyn Caldron, PTA 05/31/2024, 4:00 PM

## 2024-06-02 ENCOUNTER — Other Ambulatory Visit: Payer: Self-pay

## 2024-11-15 ENCOUNTER — Ambulatory Visit: Attending: Surgery
